# Patient Record
Sex: Male | Born: 1935 | Race: White | Hispanic: No | Marital: Married | Smoking: Former smoker
Health system: Southern US, Community
[De-identification: ages and names within clinical notes are randomized; demographics above are authoritative.]

## PROBLEM LIST (undated history)

## (undated) DIAGNOSIS — I2510 Atherosclerotic heart disease of native coronary artery without angina pectoris: Secondary | ICD-10-CM

## (undated) DIAGNOSIS — N189 Chronic kidney disease, unspecified: Secondary | ICD-10-CM

## (undated) DIAGNOSIS — I10 Essential (primary) hypertension: Secondary | ICD-10-CM

## (undated) DIAGNOSIS — K222 Esophageal obstruction: Secondary | ICD-10-CM

## (undated) DIAGNOSIS — Z8551 Personal history of malignant neoplasm of bladder: Secondary | ICD-10-CM

## (undated) DIAGNOSIS — F0390 Unspecified dementia without behavioral disturbance: Secondary | ICD-10-CM

## (undated) DIAGNOSIS — C4490 Unspecified malignant neoplasm of skin, unspecified: Secondary | ICD-10-CM

## (undated) DIAGNOSIS — J189 Pneumonia, unspecified organism: Secondary | ICD-10-CM

## (undated) DIAGNOSIS — Z9981 Dependence on supplemental oxygen: Secondary | ICD-10-CM

## (undated) DIAGNOSIS — R413 Other amnesia: Secondary | ICD-10-CM

## (undated) DIAGNOSIS — G629 Polyneuropathy, unspecified: Secondary | ICD-10-CM

## (undated) DIAGNOSIS — R0600 Dyspnea, unspecified: Secondary | ICD-10-CM

## (undated) DIAGNOSIS — I219 Acute myocardial infarction, unspecified: Secondary | ICD-10-CM

## (undated) DIAGNOSIS — E785 Hyperlipidemia, unspecified: Secondary | ICD-10-CM

## (undated) DIAGNOSIS — E538 Deficiency of other specified B group vitamins: Secondary | ICD-10-CM

## (undated) DIAGNOSIS — M1990 Unspecified osteoarthritis, unspecified site: Secondary | ICD-10-CM

## (undated) DIAGNOSIS — N400 Enlarged prostate without lower urinary tract symptoms: Secondary | ICD-10-CM

## (undated) DIAGNOSIS — I350 Nonrheumatic aortic (valve) stenosis: Secondary | ICD-10-CM

## (undated) DIAGNOSIS — J449 Chronic obstructive pulmonary disease, unspecified: Secondary | ICD-10-CM

## (undated) DIAGNOSIS — C61 Malignant neoplasm of prostate: Secondary | ICD-10-CM

## (undated) HISTORY — DX: Deficiency of other specified B group vitamins: E53.8

## (undated) HISTORY — PX: CORONARY ANGIOPLASTY WITH STENT PLACEMENT: SHX49

## (undated) HISTORY — PX: CATARACT EXTRACTION W/ INTRAOCULAR LENS  IMPLANT, BILATERAL: SHX1307

## (undated) HISTORY — DX: Chronic obstructive pulmonary disease, unspecified: J44.9

## (undated) HISTORY — PX: APPENDECTOMY: SHX54

## (undated) HISTORY — DX: Unspecified osteoarthritis, unspecified site: M19.90

## (undated) HISTORY — DX: Personal history of malignant neoplasm of bladder: Z85.51

## (undated) HISTORY — DX: Hyperlipidemia, unspecified: E78.5

## (undated) HISTORY — DX: Esophageal obstruction: K22.2

## (undated) HISTORY — PX: ESOPHAGOGASTRODUODENOSCOPY: SHX1529

## (undated) HISTORY — DX: Nonrheumatic aortic (valve) stenosis: I35.0

## (undated) HISTORY — PX: ELBOW SURGERY: SHX618

## (undated) HISTORY — PX: TONSILLECTOMY: SUR1361

## (undated) HISTORY — PX: TOTAL KNEE ARTHROPLASTY: SHX125

## (undated) HISTORY — DX: Essential (primary) hypertension: I10

## (undated) HISTORY — DX: Acute myocardial infarction, unspecified: I21.9

## (undated) HISTORY — PX: CHOLECYSTECTOMY: SHX55

## (undated) HISTORY — DX: Atherosclerotic heart disease of native coronary artery without angina pectoris: I25.10

## (undated) HISTORY — PX: COLONOSCOPY: SHX174

## (undated) HISTORY — DX: Polyneuropathy, unspecified: G62.9

---

## 1993-03-14 HISTORY — PX: CORONARY ANGIOPLASTY: SHX604

## 1997-03-14 HISTORY — PX: SIGMOIDOSCOPY: SUR1295

## 1997-07-08 ENCOUNTER — Other Ambulatory Visit: Admission: RE | Admit: 1997-07-08 | Discharge: 1997-07-08 | Payer: Self-pay | Admitting: *Deleted

## 1998-10-21 ENCOUNTER — Ambulatory Visit (HOSPITAL_COMMUNITY): Admission: RE | Admit: 1998-10-21 | Discharge: 1998-10-21 | Payer: Self-pay | Admitting: Cardiology

## 1998-11-19 ENCOUNTER — Ambulatory Visit (HOSPITAL_COMMUNITY): Admission: RE | Admit: 1998-11-19 | Discharge: 1998-11-19 | Payer: Self-pay | Admitting: Gastroenterology

## 1999-12-24 ENCOUNTER — Encounter: Payer: Self-pay | Admitting: Orthopaedic Surgery

## 1999-12-30 ENCOUNTER — Inpatient Hospital Stay (HOSPITAL_COMMUNITY): Admission: RE | Admit: 1999-12-30 | Discharge: 2000-01-03 | Payer: Self-pay | Admitting: Orthopaedic Surgery

## 2000-01-03 ENCOUNTER — Encounter: Payer: Self-pay | Admitting: Physical Medicine & Rehabilitation

## 2000-01-03 ENCOUNTER — Inpatient Hospital Stay (HOSPITAL_COMMUNITY)
Admission: RE | Admit: 2000-01-03 | Discharge: 2000-01-07 | Payer: Self-pay | Admitting: Physical Medicine & Rehabilitation

## 2002-08-08 ENCOUNTER — Encounter: Payer: Self-pay | Admitting: Internal Medicine

## 2002-10-31 ENCOUNTER — Encounter: Payer: Self-pay | Admitting: Neurology

## 2002-10-31 ENCOUNTER — Ambulatory Visit (HOSPITAL_COMMUNITY): Admission: RE | Admit: 2002-10-31 | Discharge: 2002-10-31 | Payer: Self-pay | Admitting: Neurology

## 2002-12-26 ENCOUNTER — Ambulatory Visit (HOSPITAL_COMMUNITY): Admission: RE | Admit: 2002-12-26 | Discharge: 2002-12-26 | Payer: Self-pay | Admitting: Neurology

## 2002-12-26 ENCOUNTER — Encounter: Payer: Self-pay | Admitting: Neurology

## 2004-02-01 ENCOUNTER — Ambulatory Visit: Payer: Self-pay | Admitting: Cardiovascular Disease

## 2004-02-02 ENCOUNTER — Encounter: Payer: Self-pay | Admitting: Cardiovascular Disease

## 2004-02-02 ENCOUNTER — Inpatient Hospital Stay (HOSPITAL_COMMUNITY): Admission: EM | Admit: 2004-02-02 | Discharge: 2004-02-04 | Payer: Self-pay | Admitting: Emergency Medicine

## 2004-02-16 ENCOUNTER — Ambulatory Visit: Payer: Self-pay | Admitting: Cardiology

## 2004-02-25 ENCOUNTER — Ambulatory Visit: Payer: Self-pay | Admitting: Family Medicine

## 2004-02-27 ENCOUNTER — Encounter: Admission: RE | Admit: 2004-02-27 | Discharge: 2004-02-27 | Payer: Self-pay | Admitting: Family Medicine

## 2004-03-11 ENCOUNTER — Ambulatory Visit: Payer: Self-pay | Admitting: Family Medicine

## 2004-05-31 ENCOUNTER — Encounter (INDEPENDENT_AMBULATORY_CARE_PROVIDER_SITE_OTHER): Payer: Self-pay | Admitting: *Deleted

## 2004-05-31 ENCOUNTER — Ambulatory Visit (HOSPITAL_COMMUNITY): Admission: RE | Admit: 2004-05-31 | Discharge: 2004-05-31 | Payer: Self-pay | Admitting: Gastroenterology

## 2004-12-27 ENCOUNTER — Ambulatory Visit: Payer: Self-pay | Admitting: Family Medicine

## 2005-01-31 ENCOUNTER — Ambulatory Visit: Payer: Self-pay | Admitting: Family Medicine

## 2005-02-16 ENCOUNTER — Ambulatory Visit: Payer: Self-pay | Admitting: Cardiology

## 2005-03-10 ENCOUNTER — Ambulatory Visit: Payer: Self-pay | Admitting: Family Medicine

## 2005-08-29 ENCOUNTER — Ambulatory Visit: Payer: Self-pay | Admitting: Cardiology

## 2005-12-22 ENCOUNTER — Ambulatory Visit: Payer: Self-pay | Admitting: Family Medicine

## 2006-03-31 ENCOUNTER — Ambulatory Visit: Payer: Self-pay | Admitting: Family Medicine

## 2006-08-16 ENCOUNTER — Ambulatory Visit: Payer: Self-pay | Admitting: Cardiology

## 2006-09-05 ENCOUNTER — Ambulatory Visit: Payer: Self-pay

## 2006-09-05 ENCOUNTER — Encounter: Payer: Self-pay | Admitting: Cardiology

## 2006-09-14 ENCOUNTER — Ambulatory Visit: Payer: Self-pay | Admitting: Family Medicine

## 2006-12-22 ENCOUNTER — Ambulatory Visit: Payer: Self-pay | Admitting: Family Medicine

## 2007-01-16 ENCOUNTER — Ambulatory Visit: Payer: Self-pay | Admitting: Vascular Surgery

## 2007-06-07 ENCOUNTER — Telehealth: Payer: Self-pay | Admitting: Family Medicine

## 2007-06-11 ENCOUNTER — Ambulatory Visit: Payer: Self-pay | Admitting: Family Medicine

## 2007-06-11 DIAGNOSIS — I10 Essential (primary) hypertension: Secondary | ICD-10-CM | POA: Insufficient documentation

## 2007-06-11 DIAGNOSIS — C679 Malignant neoplasm of bladder, unspecified: Secondary | ICD-10-CM | POA: Insufficient documentation

## 2007-06-11 DIAGNOSIS — I252 Old myocardial infarction: Secondary | ICD-10-CM

## 2007-06-11 DIAGNOSIS — I2510 Atherosclerotic heart disease of native coronary artery without angina pectoris: Secondary | ICD-10-CM | POA: Insufficient documentation

## 2007-06-11 DIAGNOSIS — M1990 Unspecified osteoarthritis, unspecified site: Secondary | ICD-10-CM | POA: Insufficient documentation

## 2007-06-11 DIAGNOSIS — J309 Allergic rhinitis, unspecified: Secondary | ICD-10-CM | POA: Insufficient documentation

## 2007-06-11 DIAGNOSIS — E785 Hyperlipidemia, unspecified: Secondary | ICD-10-CM | POA: Insufficient documentation

## 2007-07-31 ENCOUNTER — Ambulatory Visit: Payer: Self-pay | Admitting: Vascular Surgery

## 2007-08-23 ENCOUNTER — Ambulatory Visit: Payer: Self-pay | Admitting: Cardiology

## 2007-09-07 ENCOUNTER — Telehealth: Payer: Self-pay | Admitting: Family Medicine

## 2007-10-11 ENCOUNTER — Telehealth: Payer: Self-pay | Admitting: Family Medicine

## 2007-10-30 ENCOUNTER — Telehealth: Payer: Self-pay | Admitting: Family Medicine

## 2007-12-25 ENCOUNTER — Ambulatory Visit: Payer: Self-pay | Admitting: Family Medicine

## 2008-01-22 ENCOUNTER — Ambulatory Visit: Payer: Self-pay | Admitting: Family Medicine

## 2008-01-22 DIAGNOSIS — G589 Mononeuropathy, unspecified: Secondary | ICD-10-CM | POA: Insufficient documentation

## 2008-01-23 ENCOUNTER — Encounter: Payer: Self-pay | Admitting: Family Medicine

## 2008-01-25 ENCOUNTER — Ambulatory Visit: Payer: Self-pay | Admitting: Family Medicine

## 2008-01-25 DIAGNOSIS — E538 Deficiency of other specified B group vitamins: Secondary | ICD-10-CM | POA: Insufficient documentation

## 2008-01-25 LAB — CONVERTED CEMR LAB
ALT: 15 units/L (ref 0–53)
AST: 21 units/L (ref 0–37)
Alkaline Phosphatase: 52 units/L (ref 39–117)
Basophils Absolute: 0.1 10*3/uL (ref 0.0–0.1)
CO2: 28 meq/L (ref 19–32)
Creatinine, Ser: 1.3 mg/dL (ref 0.4–1.5)
Eosinophils Absolute: 0.1 10*3/uL (ref 0.0–0.7)
Eosinophils Relative: 2.3 % (ref 0.0–5.0)
Glucose, Bld: 94 mg/dL (ref 70–99)
HCT: 43.2 % (ref 39.0–52.0)
Hemoglobin: 15.2 g/dL (ref 13.0–17.0)
MCHC: 35.2 g/dL (ref 30.0–36.0)
MCV: 94.7 fL (ref 78.0–100.0)
Monocytes Absolute: 0.3 10*3/uL (ref 0.1–1.0)
Neutrophils Relative %: 62.7 % (ref 43.0–77.0)
Platelets: 187 10*3/uL (ref 150–400)
Potassium: 3.7 meq/L (ref 3.5–5.1)
RBC: 4.56 M/uL (ref 4.22–5.81)
RDW: 12 % (ref 11.5–14.6)
Sodium: 142 meq/L (ref 135–145)
TSH: 2.89 microintl units/mL (ref 0.35–5.50)
Total Bilirubin: 0.8 mg/dL (ref 0.3–1.2)
WBC: 5.6 10*3/uL (ref 4.5–10.5)

## 2008-01-30 LAB — CONVERTED CEMR LAB: Zinc: 822 (ref 600–1200)

## 2008-01-31 ENCOUNTER — Ambulatory Visit: Payer: Self-pay | Admitting: Vascular Surgery

## 2008-02-01 ENCOUNTER — Ambulatory Visit: Payer: Self-pay | Admitting: Family Medicine

## 2008-02-08 ENCOUNTER — Ambulatory Visit: Payer: Self-pay | Admitting: Family Medicine

## 2008-02-15 ENCOUNTER — Ambulatory Visit: Payer: Self-pay | Admitting: Family Medicine

## 2008-02-22 ENCOUNTER — Ambulatory Visit: Payer: Self-pay | Admitting: Family Medicine

## 2008-02-29 ENCOUNTER — Ambulatory Visit: Payer: Self-pay | Admitting: Family Medicine

## 2008-03-06 ENCOUNTER — Ambulatory Visit: Payer: Self-pay | Admitting: Family Medicine

## 2008-03-18 ENCOUNTER — Ambulatory Visit: Payer: Self-pay | Admitting: Family Medicine

## 2008-03-24 ENCOUNTER — Ambulatory Visit: Payer: Self-pay | Admitting: Family Medicine

## 2008-03-28 ENCOUNTER — Ambulatory Visit: Payer: Self-pay | Admitting: Family Medicine

## 2008-04-07 ENCOUNTER — Ambulatory Visit: Payer: Self-pay | Admitting: Family Medicine

## 2008-04-21 ENCOUNTER — Ambulatory Visit: Payer: Self-pay | Admitting: Family Medicine

## 2008-05-05 ENCOUNTER — Ambulatory Visit: Payer: Self-pay | Admitting: Internal Medicine

## 2008-05-12 LAB — CONVERTED CEMR LAB
Cholesterol: 301 mg/dL (ref 0–200)
Direct LDL: 188.6 mg/dL
HDL: 47.1 mg/dL (ref >39.0)
Total CHOL/HDL Ratio: 6.4
VLDL: 71 mg/dL — ABNORMAL HIGH (ref 0–40)

## 2008-05-20 ENCOUNTER — Ambulatory Visit: Payer: Self-pay | Admitting: Family Medicine

## 2008-06-17 ENCOUNTER — Ambulatory Visit: Payer: Self-pay | Admitting: Family Medicine

## 2008-06-30 ENCOUNTER — Telehealth: Payer: Self-pay | Admitting: Family Medicine

## 2008-07-18 ENCOUNTER — Ambulatory Visit: Payer: Self-pay | Admitting: Family Medicine

## 2008-07-28 ENCOUNTER — Telehealth: Payer: Self-pay | Admitting: Family Medicine

## 2008-08-08 ENCOUNTER — Ambulatory Visit: Payer: Self-pay | Admitting: Vascular Surgery

## 2008-08-12 ENCOUNTER — Ambulatory Visit: Payer: Self-pay | Admitting: Vascular Surgery

## 2008-08-12 HISTORY — PX: CAROTID ENDARTERECTOMY: SUR193

## 2008-08-15 ENCOUNTER — Ambulatory Visit: Payer: Self-pay | Admitting: Vascular Surgery

## 2008-08-15 ENCOUNTER — Encounter: Payer: Self-pay | Admitting: Vascular Surgery

## 2008-08-15 ENCOUNTER — Inpatient Hospital Stay (HOSPITAL_COMMUNITY): Admission: RE | Admit: 2008-08-15 | Discharge: 2008-08-16 | Payer: Self-pay | Admitting: Vascular Surgery

## 2008-08-26 ENCOUNTER — Ambulatory Visit: Payer: Self-pay | Admitting: Vascular Surgery

## 2008-09-04 DIAGNOSIS — I350 Nonrheumatic aortic (valve) stenosis: Secondary | ICD-10-CM

## 2008-09-08 ENCOUNTER — Telehealth: Payer: Self-pay | Admitting: Family Medicine

## 2008-09-12 ENCOUNTER — Ambulatory Visit: Payer: Self-pay | Admitting: Cardiology

## 2008-09-22 ENCOUNTER — Ambulatory Visit: Payer: Self-pay | Admitting: Family Medicine

## 2008-10-03 ENCOUNTER — Telehealth: Payer: Self-pay | Admitting: Family Medicine

## 2008-10-20 ENCOUNTER — Ambulatory Visit: Payer: Self-pay | Admitting: Family Medicine

## 2008-10-20 ENCOUNTER — Telehealth: Payer: Self-pay | Admitting: Family Medicine

## 2008-10-22 ENCOUNTER — Ambulatory Visit: Payer: Self-pay | Admitting: Family Medicine

## 2008-10-22 DIAGNOSIS — I6529 Occlusion and stenosis of unspecified carotid artery: Secondary | ICD-10-CM

## 2008-10-31 ENCOUNTER — Telehealth: Payer: Self-pay | Admitting: Family Medicine

## 2008-11-05 ENCOUNTER — Ambulatory Visit: Payer: Self-pay | Admitting: Family Medicine

## 2008-11-05 DIAGNOSIS — N401 Enlarged prostate with lower urinary tract symptoms: Secondary | ICD-10-CM

## 2008-11-10 LAB — CONVERTED CEMR LAB
AST: 21 units/L (ref 0–37)
BUN: 20 mg/dL (ref 6–23)
Basophils Absolute: 0 10*3/uL (ref 0.0–0.1)
Basophils Relative: 0.4 % (ref 0.0–3.0)
Calcium: 8.8 mg/dL (ref 8.4–10.5)
Chloride: 109 meq/L (ref 96–112)
Cholesterol: 267 mg/dL — ABNORMAL HIGH (ref 0–200)
Eosinophils Relative: 3 % (ref 0.0–5.0)
HCT: 42.3 % (ref 39.0–52.0)
Hemoglobin: 14.6 g/dL (ref 13.0–17.0)
Lymphs Abs: 1.6 10*3/uL (ref 0.7–4.0)
Neutrophils Relative %: 59.7 % (ref 43.0–77.0)
Potassium: 4.3 meq/L (ref 3.5–5.1)
RBC: 4.35 M/uL (ref 4.22–5.81)
RDW: 11.9 % (ref 11.5–14.6)
TSH: 4.22 microintl units/mL (ref 0.35–5.50)
Total CHOL/HDL Ratio: 5
VLDL: 44.8 mg/dL — ABNORMAL HIGH (ref 0.0–40.0)

## 2008-11-21 ENCOUNTER — Ambulatory Visit: Payer: Self-pay | Admitting: Family Medicine

## 2008-12-02 ENCOUNTER — Encounter: Payer: Self-pay | Admitting: Family Medicine

## 2008-12-22 ENCOUNTER — Ambulatory Visit: Payer: Self-pay | Admitting: Family Medicine

## 2008-12-22 DIAGNOSIS — H6120 Impacted cerumen, unspecified ear: Secondary | ICD-10-CM

## 2008-12-22 DIAGNOSIS — R42 Dizziness and giddiness: Secondary | ICD-10-CM | POA: Insufficient documentation

## 2009-01-21 ENCOUNTER — Ambulatory Visit: Payer: Self-pay | Admitting: Family Medicine

## 2009-02-18 ENCOUNTER — Ambulatory Visit: Payer: Self-pay | Admitting: Family Medicine

## 2009-03-02 ENCOUNTER — Ambulatory Visit: Payer: Self-pay | Admitting: Vascular Surgery

## 2009-03-03 ENCOUNTER — Ambulatory Visit: Payer: Self-pay | Admitting: Family Medicine

## 2009-03-03 DIAGNOSIS — R1310 Dysphagia, unspecified: Secondary | ICD-10-CM | POA: Insufficient documentation

## 2009-03-10 ENCOUNTER — Encounter (INDEPENDENT_AMBULATORY_CARE_PROVIDER_SITE_OTHER): Payer: Self-pay | Admitting: *Deleted

## 2009-04-06 ENCOUNTER — Telehealth: Payer: Self-pay | Admitting: Internal Medicine

## 2009-04-09 ENCOUNTER — Encounter: Payer: Self-pay | Admitting: Family Medicine

## 2009-04-09 ENCOUNTER — Encounter (INDEPENDENT_AMBULATORY_CARE_PROVIDER_SITE_OTHER): Payer: Self-pay | Admitting: *Deleted

## 2009-04-09 ENCOUNTER — Ambulatory Visit: Payer: Self-pay | Admitting: Internal Medicine

## 2009-04-09 DIAGNOSIS — K5900 Constipation, unspecified: Secondary | ICD-10-CM | POA: Insufficient documentation

## 2009-04-16 ENCOUNTER — Ambulatory Visit (HOSPITAL_COMMUNITY): Admission: RE | Admit: 2009-04-16 | Discharge: 2009-04-16 | Payer: Self-pay | Admitting: Internal Medicine

## 2009-04-16 ENCOUNTER — Ambulatory Visit: Payer: Self-pay | Admitting: Internal Medicine

## 2009-04-16 DIAGNOSIS — K222 Esophageal obstruction: Secondary | ICD-10-CM

## 2009-04-16 DIAGNOSIS — K219 Gastro-esophageal reflux disease without esophagitis: Secondary | ICD-10-CM

## 2009-04-28 ENCOUNTER — Ambulatory Visit: Payer: Self-pay | Admitting: Family Medicine

## 2009-06-01 ENCOUNTER — Ambulatory Visit: Payer: Self-pay | Admitting: Family Medicine

## 2009-06-25 ENCOUNTER — Telehealth: Payer: Self-pay | Admitting: Family Medicine

## 2009-06-30 ENCOUNTER — Ambulatory Visit: Payer: Self-pay | Admitting: Internal Medicine

## 2009-07-22 ENCOUNTER — Ambulatory Visit: Payer: Self-pay | Admitting: Family Medicine

## 2009-08-26 ENCOUNTER — Telehealth: Payer: Self-pay | Admitting: Family Medicine

## 2009-08-26 ENCOUNTER — Ambulatory Visit: Payer: Self-pay | Admitting: Family Medicine

## 2009-09-28 ENCOUNTER — Ambulatory Visit: Payer: Self-pay | Admitting: Family Medicine

## 2009-09-30 ENCOUNTER — Telehealth: Payer: Self-pay | Admitting: Family Medicine

## 2009-10-12 ENCOUNTER — Telehealth: Payer: Self-pay | Admitting: Family Medicine

## 2009-10-30 ENCOUNTER — Ambulatory Visit: Payer: Self-pay | Admitting: Family Medicine

## 2009-11-27 ENCOUNTER — Ambulatory Visit: Payer: Self-pay | Admitting: Family Medicine

## 2009-11-27 ENCOUNTER — Ambulatory Visit: Payer: Self-pay | Admitting: Cardiology

## 2009-12-07 ENCOUNTER — Telehealth (INDEPENDENT_AMBULATORY_CARE_PROVIDER_SITE_OTHER): Payer: Self-pay | Admitting: *Deleted

## 2009-12-28 ENCOUNTER — Ambulatory Visit: Payer: Self-pay | Admitting: Family Medicine

## 2010-02-03 ENCOUNTER — Ambulatory Visit: Payer: Self-pay | Admitting: Family Medicine

## 2010-03-09 ENCOUNTER — Ambulatory Visit: Payer: Self-pay | Admitting: Family Medicine

## 2010-03-30 ENCOUNTER — Ambulatory Visit
Admission: RE | Admit: 2010-03-30 | Discharge: 2010-03-30 | Payer: Self-pay | Source: Home / Self Care | Attending: Vascular Surgery | Admitting: Vascular Surgery

## 2010-04-06 NOTE — Procedures (Unsigned)
CAROTID DUPLEX EXAM  INDICATION:  Followup CEA.  HISTORY: Diabetes:  No Cardiac:  Yes Hypertension:  Yes Smoking:  No Previous Surgery:  Left CEA with ICA resection performed on 08/15/2008 by Dr. Hart Rochester CV History: Amaurosis Fugax  No, Paresthesias  No, Hemiparesis  No                                      RIGHT             LEFT Brachial systolic pressure:         164               160 Brachial Doppler waveforms:         WNL               WNL Vertebral direction of flow:        Antegrade         Antegrade DUPLEX VELOCITIES (cm/sec) CCA peak systolic                   59                58 ECA peak systolic                   83                50 ICA peak systolic                   48                76 ICA end diastolic                   14                24 PLAQUE MORPHOLOGY:                  Heterogenous / calcific             NA PLAQUE AMOUNT:                      mild              NA PLAQUE LOCATION:                    CCA/ ECA/ ICA     NA  IMPRESSION: 1. Widely patent left carotid endarterectomy and anastomosis of     internal carotid artery resection. 2. 1% to 39% right internal carotid artery plaquing. 3. Antegrade vertebral arteries bilaterally. 4. Stable findings from previous exam.  ___________________________________________ Quita Skye. Hart Rochester, M.D.  LT/MEDQ  D:  03/30/2010  T:  03/30/2010  Job:  [id number]

## 2010-04-13 ENCOUNTER — Ambulatory Visit
Admission: RE | Admit: 2010-04-13 | Discharge: 2010-04-13 | Payer: Self-pay | Source: Home / Self Care | Attending: Family Medicine | Admitting: Family Medicine

## 2010-04-13 DIAGNOSIS — J209 Acute bronchitis, unspecified: Secondary | ICD-10-CM | POA: Insufficient documentation

## 2010-04-13 DIAGNOSIS — R05 Cough: Secondary | ICD-10-CM | POA: Insufficient documentation

## 2010-04-13 NOTE — Assessment & Plan Note (Signed)
Summary: B-12INJ/RCD  Nurse Visit   Vitals Entered By: Raechel Ache, RN (August 26, 2009 1:53 PM)  Allergies: 1)  ! Sulfamethoxazole (Sulfamethoxazole)  Medication Administration  Injection # 1:    Medication: Vit B12 1000 mcg    Diagnosis: VITAMIN B12 DEFICIENCY (ICD-266.2)    Route: IM    Site: L deltoid    Exp Date: 09/12    Lot #: [id number]    Mfr: [employer]    Patient tolerated injection without complications    Given by: Raechel Ache, RN (August 26, 2009 1:54 PM)  Orders Added: 1)  Vit B12 1000 mcg [J3420] 2)  Admin of Therapeutic Inj  intramuscular or subcutaneous [[id number]]

## 2010-04-13 NOTE — Assessment & Plan Note (Signed)
Summary: B12 INJ/NJR  Nurse Visit   Allergies: 1)  ! Sulfamethoxazole (Sulfamethoxazole)  Medication Administration  Injection # 1:    Medication: Vit B12 1000 mcg    Diagnosis: VITAMIN B12 DEFICIENCY (ICD-266.2)    Route: IM    Site: L deltoid    Exp Date: 09/12    Lot #: [id number]    Mfr: [employer]    Patient tolerated injection without complications    Given by: Raechel Ache, RN (Jul 22, 2009 2:51 PM)  Orders Added: 1)  Vit B12 1000 mcg [J3420] 2)  Admin of Therapeutic Inj  intramuscular or subcutaneous [[id number]]

## 2010-04-13 NOTE — Progress Notes (Signed)
  Phone Note Refill Request   Refills Requested: Medication #1:  ALLEGRA 180 MG  TABS once daily   Dosage confirmed as above?Dosage Confirmed  Medication #2:  TIAZAC 240 MG  CP24 1 by mouth once daily   Dosage confirmed as above?Dosage Confirmed Initial call taken by: Josph Macho RMA,  October 12, 2009 1:11 PM    Prescriptions: TIAZAC 240 MG  CP24 (DILTIAZEM HCL ER BEADS) 1 by mouth once daily  #90 x 3   Entered by:   Josph Macho RMA   Authorized by:   Nelwyn Salisbury MD   Signed by:   Josph Macho RMA on 10/12/2009   Method used:   Faxed to ...       [facility])       [address] [address]       [place], [place]  [address]       Ph: [phone]       Fax: [phone]   RxID:   [id number] ALLEGRA 180 MG  TABS (FEXOFENADINE HCL) once daily  #90 x 3   Entered by:   Josph Macho RMA   Authorized by:   Nelwyn Salisbury MD   Signed by:   Josph Macho RMA on 10/12/2009   Method used:   Faxed to ...       [facility])       [address] [address]       [place], [place]  [address]       Ph: [phone]       Fax: [phone]   RxID:   [id number]

## 2010-04-13 NOTE — Progress Notes (Signed)
Summary: Flu vaccine  Phone Note From Other Clinic        Immunization History:  Influenza Immunization History:    Influenza:  historical (10/06/2009)

## 2010-04-13 NOTE — Assessment & Plan Note (Signed)
Summary: B12 INJ // RS  Nurse Visit   Allergies: 1)  ! Sulfamethoxazole (Sulfamethoxazole)  Medication Administration  Injection # 1:    Medication: Vit B12 1000 mcg    Diagnosis: VITAMIN B12 DEFICIENCY (ICD-266.2)    Route: IM    Site: L deltoid    Exp Date: 7/13    Lot #: [id number]    Mfr: [employer]    Patient tolerated injection without complications    Given by: Alfred Levins, CMA (February 03, 2010 1:44 PM)  Orders Added: 1)  Vit B12 1000 mcg [J3420] 2)  Admin of Therapeutic Inj  intramuscular or subcutaneous [[id number]]

## 2010-04-13 NOTE — Progress Notes (Signed)
Summary: question  Phone Note Other Incoming   Caller: patient Summary of Call: Is it ok to take Neuropathy formula?  OTC Vit B's and Lipoic acid. Initial call taken by: Raechel Ache, RN,  August 26, 2009 1:53 PM  Follow-up for Phone Call        yes this is fine Follow-up by: Nelwyn Salisbury MD,  August 27, 2009 8:26 AM  Additional Follow-up for Phone Call Additional follow up Details #1::        Phone call completed Additional Follow-up by: Raechel Ache, RN,  August 27, 2009 10:31 AM

## 2010-04-13 NOTE — Letter (Signed)
Summary: Application for Handicapped Placard  Application for Handicapped Placard   Imported By: Maryln Gottron 04/09/2009 13:54:22  _____________________________________________________________________  External Attachment:    Type:   Image     Comment:   External Document

## 2010-04-13 NOTE — Assessment & Plan Note (Signed)
Summary: B-12//ALP  Nurse Visit   Allergies: 1)  ! Sulfamethoxazole (Sulfamethoxazole)  Medication Administration  Injection # 1:    Medication: Vit B12 1000 mcg    Diagnosis: VITAMIN B12 DEFICIENCY (ICD-266.2)    Route: IM    Site: L deltoid    Exp Date: 02/13    Lot #: [id number]    Mfr: [employer]    Patient tolerated injection without complications    Given by: Raechel Ache, RN (September 28, 2009 1:46 PM)  Orders Added: 1)  Vit B12 1000 mcg [J3420] 2)  Admin of Therapeutic Inj  intramuscular or subcutaneous [[id number]]

## 2010-04-13 NOTE — Procedures (Signed)
Summary: Instructions for procedure/[facility] (out pt)  Instructions for procedure/[facility] (out pt)   Imported By: Sherian Rein 04/16/2009 08:52:14  _____________________________________________________________________  External Attachment:    Type:   Image     Comment:   External Document

## 2010-04-13 NOTE — Progress Notes (Signed)
Summary: REQ FOR REFILL RX (MAVIK / FLOMAX)  Phone Note Call from Patient   Caller: Patient  [phone] Reason for Call: Refill Medication Summary of Call: Pt called to adv that he needs to have a refill on meds:  Mavik 4 Mg  and   Flomax 0.4 Mg  ...... Pt adv that both Rx can be sent to [facility] at [phone].  Initial call taken by: Debbra Riding,  June 25, 2009 11:04 AM  Follow-up for Phone Call        Rx faxed Follow-up by: Raechel Ache, RN,  June 25, 2009 11:16 AM    Prescriptions: FLOMAX 0.4 MG  CP24 (TAMSULOSIN HCL) 1 by mouth once daily  #90 x 3   Entered by:   Raechel Ache, RN   Authorized by:   Nelwyn Salisbury MD   Signed by:   Raechel Ache, RN on 06/25/2009   Method used:   Printed then faxed to ...       [facility]. [phone]* (retail)       [phone]       [place], [place]  [address]       Ph: [phone]       Fax: [phone]   RxID:   [id number] MAVIK 4 MG  TABS (TRANDOLAPRIL) Take 1 tablet by mouth once a day  #90 x 3   Entered by:   Raechel Ache, RN   Authorized by:   Nelwyn Salisbury MD   Signed by:   Raechel Ache, RN on 06/25/2009   Method used:   Printed then faxed to ...       [facility]. [phone]* (retail)       [phone] [address]       [place], [place]  [address]       Ph: [phone]       Fax: [phone]   RxID:   [id number]

## 2010-04-13 NOTE — Assessment & Plan Note (Signed)
Summary: B12 INJ // RS  Nurse Visit   Allergies: 1)  ! Sulfamethoxazole (Sulfamethoxazole)  Medication Administration  Injection # 1:    Medication: Vit B12 1000 mcg    Diagnosis: VITAMIN B12 DEFICIENCY (ICD-266.2)    Route: IM    Site: L deltoid    Exp Date: 04/13    Lot #: [id number]    Mfr: [employer]    Patient tolerated injection without complications    Given by: Raechel Ache, RN (October 30, 2009 2:11 PM)  Orders Added: 1)  Vit B12 1000 mcg [J3420] 2)  Admin of Therapeutic Inj  intramuscular or subcutaneous [[id number]]

## 2010-04-13 NOTE — Procedures (Signed)
Summary: Upper Endoscopy w/DIL  Patient: Thomas [name] Note: All result statuses are Final unless otherwise noted.  Tests: (1) Upper Endoscopy w/DIL (UED)  UED Upper Endoscopy w/DIL                             DONE     [facility]     [address] [place], [place]  [address]           ENDOSCOPY PROCEDURE REPORT           PATIENT:  [name], [name]  MR#:  [id number]     BIRTHDATE:  Jul 24, 1916, 89 yrs. old  GENDER:  male           ENDOSCOPIST:  Iva Boop, MD, [facility]           PROCEDURE DATE:  04/16/2009     PROCEDURE:  EGD with balloon dilatation     ASA CLASS:  Class III     INDICATIONS:  1) dysphagia           MEDICATIONS:   Fentanyl 50 mcg IV, Versed 6 mg IV     TOPICAL ANESTHETIC:  Cetacaine Spray           DESCRIPTION OF PROCEDURE:   After the risks benefits and     alternatives of the procedure were thoroughly explained, informed     consent was obtained.  The  endoscope was introduced through the     mouth and advanced to the second portion of the duodenum, without     limitations.  The instrument was slowly withdrawn as the mucosa     was carefully examined.     <<PROCEDUREIMAGES>>           A stricture was found in the distal esophagus. Ring-like     stricture, no inflammation.  A hiatal hernia was found. It was 2     cm in size.  Mild gastritis was found. Mottled and friable mucosa.     The examination was otherwise normal.    Dilation was then     performed at the distal esophagus           1) Dilator:  Balloon  Size(s):  18 mm     Resistance:  minimal  Heme:  yes     Appearance:  satisfactory           COMPLICATIONS:  None           ENDOSCOPIC IMPRESSION:     1) Stricture in the distal esophagus - dilated to 18 mm     2) 2 cm hiatal hernia     3) Mild gastritis     4) Otherwise normal examination.     RECOMMENDATIONS:     Clear liquids until 530 PM and then soft diet.     Regular diet tomorrow.     Resume Plavix tomorrow.     resume  other meds other meds today     Start omeprazole 20 mg daily (Rx sent to pharmacy)           REPEAT EXAM:  as needed           Iva Boop, MD, [facility]           CC:  Tera Mater. Clent Ridges, M.D.     The Patient           n.  eSIGNED:   Iva Boop at 04/16/2009 03:33 PM           Ralene Muskrat, [phone]  Note: An exclamation mark (!) indicates a result that was not dispersed into the flowsheet. Document Creation Date: 04/16/2009 3:33 PM _______________________________________________________________________  (1) Order result status: Final Collection or observation date-time: 04/16/2009 15:08 Requested date-time:  Receipt date-time:  Reported date-time:  Referring Physician:   Ordering Physician: Stan Head [phone]) Specimen Source:  Source: Launa Grill Order Number: [id number] Lab site:   Appended Document: Upper Endoscopy w/DIL need to arrange late April (approx) follow-up with me  Appended Document: Upper Endoscopy w/DIL April schedule is not out yet, I have sent myself a reminder flag for the beginning of March to arrange for late April appointment   Appended Document: Upper Endoscopy w/DIL Patient  is scheduled for 06-30-09 2:00

## 2010-04-13 NOTE — Assessment & Plan Note (Signed)
Summary: b-12 inj/cjr  Nurse Visit   Allergies: 1)  ! Sulfamethoxazole (Sulfamethoxazole)  Medication Administration  Injection # 1:    Medication: Vit B12 1000 mcg    Diagnosis: VITAMIN B12 DEFICIENCY (ICD-266.2)    Route: IM    Site: L deltoid    Exp Date: 9/12    Lot #: [id number]    Mfr: [employer]    Patient tolerated injection without complications    Given by: Alfred Levins, CMA (April 28, 2009 2:03 PM)  Orders Added: 1)  Vit B12 1000 mcg [J3420] 2)  Admin of Therapeutic Inj  intramuscular or subcutaneous [[id number]]

## 2010-04-13 NOTE — Assessment & Plan Note (Signed)
Summary: follow up EGD/sheri   History of Present Illness Visit Type: Follow-up Visit Primary GI MD: Stan Head MD [facility] Primary Provider: Nelwyn Salisbury MD Requesting Provider: Nelwyn Salisbury MD Chief Complaint: constipation History of Present Illness:   77 yo white man with chronic constipation x years. He takes a dulcolax or 2  every third day with good results.  He is without heartbburn or dysphagia after EGD and dilation 04/16/09. Esophageal stricture found and omeprazole was started.   GI Review of Systems      Denies abdominal pain, acid reflux, belching, bloating, chest pain, dysphagia with liquids, dysphagia with solids, heartburn, loss of appetite, nausea, vomiting, vomiting blood, weight loss, and  weight gain.      Reports constipation.     Denies anal fissure, black tarry stools, change in bowel habit, diarrhea, diverticulosis, fecal incontinence, heme positive stool, hemorrhoids, irritable bowel syndrome, jaundice, light color stool, liver problems, rectal bleeding, and  rectal pain.    EGD  Procedure date:  04/16/2009  Findings:       ENDOSCOPIC IMPRESSION:     1) Stricture in the distal esophagus - dilated to 18 mm     2) 2 cm hiatal hernia     3) Mild gastritis     4) Otherwise normal examination.   Current Medications (verified): 1)  Centrum Silver   Tabs (Multiple Vitamins-Minerals) .... Daily 2)  Mavik 4 Mg  Tabs (Trandolapril) .... Take 1 Tablet By Mouth Once A Day 3)  Plavix 75 Mg  Tabs (Clopidogrel Bisulfate) .... Take 1 Tablet By Mouth Once A Day 4)  Flomax 0.4 Mg  Cp24 (Tamsulosin Hcl) .Marland Kitchen.. 1 By Mouth Once Daily 5)  Aspirin 81 Mg  Tbec (Aspirin) .Marland Kitchen.. 1 By Mouth Once Daily 6)  Tiazac 240 Mg  Cp24 (Diltiazem Hcl Er Beads) .Marland Kitchen.. 1 By Mouth Once Daily 7)  Nitroglycerin 0.4 Mg  Subl (Nitroglycerin) .... As Needed 8)  Allegra 180 Mg  Tabs (Fexofenadine Hcl) .... Once Daily 9)  Omeprazole 20 Mg  Cpdr (Omeprazole) .Marland Kitchen.. 1 Each Day 30 Minutes Before Meal 10)   Dulcolax 5 Mg  Tbec (Bisacodyl) .Marland Kitchen.. 1-2 By Mouth As Needed For Constipation  Allergies (verified): 1)  ! Sulfamethoxazole (Sulfamethoxazole)  Past History:  Past Medical History: AORTIC STENOSIS, MILD (ICD-424.1), last ECHO in 6-08 CAROTID ARTERY DISEASE/NONOBSTRUCTIVE (ICD-433.10), sees Dr. Hart Rochester MYOCARDIAL INFARCTION, HX OF (ICD-412) CORONARY ARTERY DISEASE (ICD-414.00), sees Dr. Valera Castle HYPERTENSION (ICD-401.9) HYPERLIPIDEMIA (ICD-272.4) VITAMIN B12 DEFICIENCY (ICD-266.2) NEUROPATHY (ICD-355.9) ALLERGIC RHINITIS (ICD-477.9) OSTEOARTHRITIS (ICD-715.90) BLADDER CANCER (ICD-188.9)Hx of, sees Dr. Gaynelle Arabian GERD and ESOPHAGEAL STRICTURE     Past Surgical History: Reviewed history from 04/09/2009 and no changes required. Angioplasty x3 Appendectomy Cholecystectomy Tonsillectomy Sigmoidoscopy 1999 Colonoscopy 08/08/02 and 2006 (diverticulosis, hemorrhoids) Bilateral elbow surgery,  Left knee replacement.  PTCA and stenting 1994 Carotid endarterectomy, left 08-17-08 per Dr. Hart Rochester  Family History: Reviewed history from 04/09/2009 and no changes required. Family History of Prostate CA 1st degree relative <50 Family History of Coronary Artery Disease:  Brother Negative for stroke and diabetes.     No FH of Colon Cancer:  Social History: Reviewed history from 09/04/2008 and no changes required. Retired Married Alcohol use-yes.Marland Kitchenoccasional Drug use-no Tobacco Use - Former. quit 1970's  Vital Signs:  Patient profile:   77 year old male Height:      68 inches Weight:      155.50 pounds Pulse rate:   80 / minute Pulse rhythm:   regular  BP sitting:   128 / 76  (left arm) Cuff size:   regular  Vitals Entered By: June McMurray CMA Duncan Dull) (June 30, 2009 1:59 PM)  Physical Exam  General:  younger thn stated age, NAD   Impression & Recommendations:  Problem # 1:  ESOPHAGEAL STRICTURE (ICD-530.3) Assessment Improved has responded to dilation and PPI will  continue omeprazole 20 mg/day  Problem # 2:  GERD (ICD-530.81) Assessment: Improved will continue omeprazole 20 mg once daily and he can see me as needed and may get future refills through PCP (Dr. Clent Ridges)  Problem # 3:  CONSTIPATION (ICD-564.00) Assessment: Unchanged will retry MiraLax daily but ok to use as needed Dulcolax 15 minutes time spent with patient on three problems  Patient Instructions: 1)  Please continue current medications. See list below and you can start MiraLax (1 dose = 1 tablespoon or cap in 8 oz water or other liquid) daily. If that does not help you you can take it twice a day and you can continue to use the dulcolax as needed. 2)  If yur swallowing roblems return call me otherwise continue follow-up with Dr. Clent Ridges. 3)  The medication list was reviewed and reconciled.  All changed / newly prescribed medications were explained.  A complete medication list was provided to the patient / caregiver.

## 2010-04-13 NOTE — Progress Notes (Signed)
Summary: refills  Phone Note Refill Request Call back at Home Phone [phone] Message from:  Patient---live call  Refills Requested: Medication #1:  TIAZAC 240 MG  CP24 1 by mouth once daily  Medication #2:  ALLEGRA 180 MG  TABS once daily   Brand Name Necessary? No [facility]  Initial call taken by: Warnell Forester,  September 30, 2009 12:30 PM    Prescriptions: ALLEGRA 180 MG  TABS (FEXOFENADINE HCL) once daily  #90 x 3   Entered and Authorized by:   Raechel Ache, RN   Signed by:   Raechel Ache, RN on 09/30/2009   Method used:   Faxed to ...       [facility] (mail-order)       [address]       [place], [place]  [address]       Ph: [phone]       Fax: [phone]   RxID:   [id number] TIAZAC 240 MG  CP24 (DILTIAZEM HCL ER BEADS) 1 by mouth once daily  #90 x 3   Entered and Authorized by:   Raechel Ache, RN   Signed by:   Raechel Ache, RN on 09/30/2009   Method used:   Faxed to ...       [facility] (mail-order)       [address]       [place], [place]  [address]       Ph: [phone]       Fax: [phone]   RxID:   [id number]

## 2010-04-13 NOTE — Assessment & Plan Note (Signed)
Summary: b12 inj/njr rt delt  Nurse Visit   Allergies: 1)  ! Sulfamethoxazole (Sulfamethoxazole)  Medication Administration  Injection # 1:    Medication: Vit B12 1000 mcg    Diagnosis: VITAMIN B12 DEFICIENCY (ICD-266.2)    Route: IM    Site: R deltoid    Exp Date: 08/2011    Lot #: [id number]    Mfr: [employer]    Patient tolerated injection without complications    Given by: Pura Spice, RN (November 27, 2009 2:28 PM)  Orders Added: 1)  Vit B12 1000 mcg [J3420] 2)  Admin of Therapeutic Inj  intramuscular or subcutaneous [16109]

## 2010-04-13 NOTE — Assessment & Plan Note (Signed)
Summary: DYSPHAGIA--CH   History of Present Illness Visit Type: consult  Primary GI MD: Stan Head MD [facility] Primary Provider: Nelwyn Salisbury MD Requesting Provider: Nelwyn Salisbury MD Chief Complaint: constipation, dysphagia, and black tarry stools History of Present Illness:   6 month hx of dysphagia has to chew very well and cut food very small or will have impact dysphagia and then eventual regurgitation last night, pills did not sem to go down, then regurgitated "clear jello" and was ok  hard, large stools unless he takes a dulcolax every 3 days stool softenes did not help Miralax once daily x 5-6 mos no help  colonoscpy 2004 (Me) and 2006 Madilyn Fireman)    GI Review of Systems    Reports dysphagia with solids and  weight loss.   Weight loss of 8 pounds   Denies abdominal pain, acid reflux, belching, bloating, chest pain, dysphagia with liquids, heartburn, loss of appetite, nausea, vomiting, vomiting blood, and  weight gain.      Reports black tarry stools and  constipation.     Denies anal fissure, change in bowel habit, diarrhea, diverticulosis, fecal incontinence, heme positive stool, hemorrhoids, irritable bowel syndrome, jaundice, light color stool, liver problems, rectal bleeding, and  rectal pain.    Current Medications (verified): 1)  Centrum Silver   Tabs (Multiple Vitamins-Minerals) .... Daily 2)  Mavik 4 Mg  Tabs (Trandolapril) .... Take 1 Tablet By Mouth Once A Day 3)  Plavix 75 Mg  Tabs (Clopidogrel Bisulfate) .... Take 1 Tablet By Mouth Once A Day 4)  Flomax 0.4 Mg  Cp24 (Tamsulosin Hcl) .Marland Kitchen.. 1 By Mouth Once Daily 5)  Aspirin 81 Mg  Tbec (Aspirin) .Marland Kitchen.. 1 By Mouth Once Daily 6)  Tiazac 240 Mg  Cp24 (Diltiazem Hcl Er Beads) .Marland Kitchen.. 1 By Mouth Once Daily 7)  Nitroglycerin 0.4 Mg  Subl (Nitroglycerin) .... As Needed 8)  Allegra 180 Mg  Tabs (Fexofenadine Hcl) .... Once Daily  Allergies (verified): 1)  ! Sulfamethoxazole (Sulfamethoxazole)  Past History:  Past Medical  History: Reviewed history from 11/05/2008 and no changes required. AORTIC STENOSIS, MILD (ICD-424.1), last ECHO in 6-08 CAROTID ARTERY DISEASE/NONOBSTRUCTIVE (ICD-433.10), sees Dr. Hart Rochester MYOCARDIAL INFARCTION, HX OF (ICD-412) CORONARY ARTERY DISEASE (ICD-414.00), sees Dr. Valera Castle HYPERTENSION (ICD-401.9) HYPERLIPIDEMIA (ICD-272.4) VITAMIN B12 DEFICIENCY (ICD-266.2) NEUROPATHY (ICD-355.9) ALLERGIC RHINITIS (ICD-477.9) OSTEOARTHRITIS (ICD-715.90) BLADDER CANCER (ICD-188.9)Hx of, sees Dr. Gaynelle Arabian     Past Surgical History: Angioplasty x3 Appendectomy Cholecystectomy Tonsillectomy Sigmoidoscopy 1999 Colonoscopy 08/08/02 and 2006 (diverticulosis, hemorrhoids) Bilateral elbow surgery,  Left knee replacement.  PTCA and stenting 1994 Carotid endarterectomy, left 08-17-08 per Dr. Hart Rochester  Family History: Family History of Prostate CA 1st degree relative <50 Family History of Coronary Artery Disease:  Brother Negative for stroke and diabetes.     No FH of Colon Cancer:  Social History: Reviewed history from 09/04/2008 and no changes required. Retired Married Alcohol use-yes.Marland Kitchenoccasional Drug use-no Tobacco Use - Former. quit 1970's  Review of Systems       The patient complains of arthritis/joint pain, hearing problems, muscle pains/cramps, and urination - excessive.         All other ROS negative except as per HPI.   Vital Signs:  Patient profile:   77 year old male Height:      68 inches Weight:      161 pounds BSA:     1.87 Pulse rate:   88 / minute Pulse rhythm:   regular BP sitting:   132 / 72  (  left arm) Cuff size:   regular  Vitals Entered By: Ok Anis CMA (April 09, 2009 2:19 PM)  Physical Exam  General:  younger thn stated age, NAD Eyes:  anicteric Mouth:  clear dentures Neck:  Supple; no masses or thyromegaly. Lungs:  a few crackles and wheezes Heart:  musical 3/6 sys murmur radiates to neck and abd s1s2 reg rate and rhythm Abdomen:   Bowel sounds positive,abdomen soft and non-tender without masses, organomegaly or hernias noted. Heart murmur heard vs. bruits Rectal:  Normal exam. heme negative Extremities:  trace bilat lower edea Neurologic:  Alert and  oriented x4; Cervical Nodes:  No significant cervical or supraclavicular adenopathy.  Psych:  Alert and cooperative. Normal mood and affect.   Impression & Recommendations:  Problem # 1:  DYSPHAGIA UNSPECIFIED (ICD-787.20) new problem  to me mild weight loss also ? peptic, motility or neoplasia - mild 6-8# weight loss and 6 months of increasing sxs and age raises increased concern for malignancy, I think  Risks, benefits,and indications of endoscopic procedure(s) were reviewed with the patient and all questions answered. Will need to be off Plavix (increasd risk). I have explained risk of vascular events off Plavix but that unable to dilate while on. No active coronary or vascular sxs so stop without other MD input. Orders: ZEGD (ZEGD) + dilation  Problem # 2:  CONSTIPATION (ICD-564.00) Assessment: New ? medications negative colonoscopies 2004 and 6 except diverticulosis which may be part of it. Will try MiraLax two times a day +/- dulcolax at is age and with comorbidities would not initiate colonoscopy based upon what I know at this point and need to know what cause of dysphagia is  Patient Instructions: 1)  Avoid meats as much as possible unless they are soft. Chew well and eat slowly, drink fluids after eating and reman upright while eating. Take 1 pill at a time. 2)  Hold your Plavix starting tomorrow. 3)  We will see you at your procedure on 04/16/09 at [facility]. 4)  Begin using Miralax two times a day. 5)  Copy sent to : Gershon Crane, MD 6)  The medication list was reviewed and reconciled.  All changed / newly prescribed medications were explained.  A complete medication list was provided to the patient / caregiver.

## 2010-04-13 NOTE — Miscellaneous (Signed)
Summary: omeprazole rx

## 2010-04-13 NOTE — Assessment & Plan Note (Signed)
Summary: f1y per pt call/lg   Visit Type:  1 yr f/u Referring Jagar Lua:  Nelwyn Salisbury MD Primary Iyona Pehrson:  Nelwyn Salisbury MD  CC:  no cardiac complaints today.  History of Present Illness: Thomas [name] comes in today for followup of his cardiac and vascular disease.  He continues to thrive at 77 years of age. He is in his usual good sensing you were. He denies any angina or ischemic symptoms. He's had no symptoms of TIAs or mini strokes. His carotid endarterectomy has been successful and is followed by Dr. Hart Rochester.  Denies any presyncope or syncope. He denies palpitations.  Current Medications (verified): 1)  Centrum Silver   Tabs (Multiple Vitamins-Minerals) .... Daily 2)  Mavik 4 Mg  Tabs (Trandolapril) .... Take 1 Tablet By Mouth Once A Day 3)  Plavix 75 Mg  Tabs (Clopidogrel Bisulfate) .... Take 1 Tablet By Mouth Once A Day 4)  Flomax 0.4 Mg  Cp24 (Tamsulosin Hcl) .Marland Kitchen.. 1 By Mouth Once Daily 5)  Aspirin 81 Mg  Tbec (Aspirin) .Marland Kitchen.. 1 By Mouth Once Daily 6)  Tiazac 240 Mg  Cp24 (Diltiazem Hcl Er Beads) .Marland Kitchen.. 1 By Mouth Once Daily 7)  Nitroglycerin 0.4 Mg  Subl (Nitroglycerin) .... As Needed 8)  Omeprazole 20 Mg  Cpdr (Omeprazole) .Marland Kitchen.. 1 Each Day 30 Minutes Before Meal 9)  Dulcolax 5 Mg  Tbec (Bisacodyl) .Marland Kitchen.. 1-2 By Mouth As Needed For Constipation 10)  Miralax   Powd (Polyethylene Glycol 3350) .Marland Kitchen.. 1-2 Doses Daily  Allergies: 1)  ! Sulfamethoxazole (Sulfamethoxazole)  Past History:  Past Medical History: Last updated: 06/30/2009 AORTIC STENOSIS, MILD (ICD-424.1), last ECHO in 6-08 CAROTID ARTERY DISEASE/NONOBSTRUCTIVE (ICD-433.10), sees Dr. Hart Rochester MYOCARDIAL INFARCTION, HX OF (ICD-412) CORONARY ARTERY DISEASE (ICD-414.00), sees Dr. Valera Castle HYPERTENSION (ICD-401.9) HYPERLIPIDEMIA (ICD-272.4) VITAMIN B12 DEFICIENCY (ICD-266.2) NEUROPATHY (ICD-355.9) ALLERGIC RHINITIS (ICD-477.9) OSTEOARTHRITIS (ICD-715.90) BLADDER CANCER (ICD-188.9)Hx of, sees Dr. Gaynelle Arabian GERD and  ESOPHAGEAL STRICTURE     Past Surgical History: Last updated: 04/09/2009 Angioplasty x3 Appendectomy Cholecystectomy Tonsillectomy Sigmoidoscopy 1999 Colonoscopy 08/08/02 and 2006 (diverticulosis, hemorrhoids) Bilateral elbow surgery,  Left knee replacement.  PTCA and stenting 1994 Carotid endarterectomy, left 08-17-08 per Dr. Hart Rochester  Family History: Last updated: 04/09/2009 Family History of Prostate CA 1st degree relative <50 Family History of Coronary Artery Disease:  Brother Negative for stroke and diabetes.     No FH of Colon Cancer:  Social History: Last updated: 09/04/2008 Retired Married Alcohol use-yes.Marland Kitchenoccasional Drug use-no Tobacco Use - Former. quit 1970's  Risk Factors: Smoking Status: quit (09/04/2008)  Review of Systems       negative other than history of present illness  Vital Signs:  Patient profile:   77 year old male Height:      68 inches Weight:      158.8 pounds BMI:     24.23 Pulse rate:   64 / minute Pulse rhythm:   irregular BP sitting:   118 / 70  (left arm) Cuff size:   large  Vitals Entered By: Danielle Rankin, CMA (November 27, 2009 12:10 PM)  Physical Exam  General:  elderly, in no acute distress Head:  normocephalic and atraumatic Eyes:  glasses otherwise normal Neck:  Neck supple, no JVD. No masses, thyromegaly or abnormal cervical nodes. Lungs:  Clear bilaterally to auscultation and percussion. Heart:  PMI nondisplaced, soft murmur at the left lower sternal border, regular rate and rhythm, S2 split. Left carotid bruit Msk:  decreased ROM.   Pulses:  reduced but  present lower extremity Extremities:  No clubbing or cyanosis. Neurologic:  Alert and oriented x 3. Skin:  Intact without lesions or rashes. Psych:  Normal affect.   Impression & Recommendations:  Problem # 1:  CAROTID ARTERY DISEASE/NONOBSTRUCTIVE (ICD-433.10) Assessment Unchanged  His updated medication list for this problem includes:    Plavix 75 Mg Tabs  (Clopidogrel bisulfate) .Marland Kitchen... Take 1 tablet by mouth once a day    Aspirin 81 Mg Tbec (Aspirin) .Marland Kitchen... 1 by mouth once daily  Problem # 2:  AORTIC STENOSIS, MILD (ICD-424.1) Assessment: Unchanged  His updated medication list for this problem includes:    Mavik 4 Mg Tabs (Trandolapril) .Marland Kitchen... Take 1 tablet by mouth once a day    Nitroglycerin 0.4 Mg Subl (Nitroglycerin) .Marland Kitchen... As needed  Problem # 3:  CORONARY ARTERY DISEASE (ICD-414.00) Assessment: Unchanged Will continue medical therapy His updated medication list for this problem includes:    Mavik 4 Mg Tabs (Trandolapril) .Marland Kitchen... Take 1 tablet by mouth once a day    Plavix 75 Mg Tabs (Clopidogrel bisulfate) .Marland Kitchen... Take 1 tablet by mouth once a day    Aspirin 81 Mg Tbec (Aspirin) .Marland Kitchen... 1 by mouth once daily    Tiazac 240 Mg Cp24 (Diltiazem hcl er beads) .Marland Kitchen... 1 by mouth once daily    Nitroglycerin 0.4 Mg Subl (Nitroglycerin) .Marland Kitchen... As needed  Orders: EKG w/ Interpretation (93000)  Problem # 4:  MYOCARDIAL INFARCTION, HX OF (ICD-412) Assessment: Unchanged  His updated medication list for this problem includes:    Mavik 4 Mg Tabs (Trandolapril) .Marland Kitchen... Take 1 tablet by mouth once a day    Plavix 75 Mg Tabs (Clopidogrel bisulfate) .Marland Kitchen... Take 1 tablet by mouth once a day    Aspirin 81 Mg Tbec (Aspirin) .Marland Kitchen... 1 by mouth once daily    Tiazac 240 Mg Cp24 (Diltiazem hcl er beads) .Marland Kitchen... 1 by mouth once daily    Nitroglycerin 0.4 Mg Subl (Nitroglycerin) .Marland Kitchen... As needed  Clinical Reports Reviewed:  Cardiac Cath:  02/04/2004: Cardiac Cath Findings:   CV History:  Asymptomatic.   Amaurosis Fugax No, Paresthesias No, Hemiparesis No                                          RIGHT             LEFT   Brachial systolic pressure:         144               146   Brachial Doppler waveforms:         Normal            Normal   Vertebral direction of flow:        Antegrade         Antegrade   DUPLEX VELOCITIES (cm/sec)   CCA peak systolic                    80                92   ECA peak systolic                   88                89   ICA peak systolic  70                353   ICA end diastolic                   15                84   PLAQUE MORPHOLOGY:                  Mixed             Mixed   PLAQUE AMOUNT:                      Mild              Moderate/severe   PLAQUE LOCATION:                    ICA/ECA           ICA/ECA/CCA      IMPRESSION:   1. 1-39% stenosis of the right internal carotid artery.   2. High end 60-79% stenosis of the left internal carotid artery.   3. No significant change noted when compared to the previous exam on       07/31/2007.      ___________________________________________   Quita Skye. Hart Rochester, M.D.      CH/MEDQ  D:  01/31/2008  T:  01/31/2008  Job:  [id number]   12/08/1992: Cardiac Cath Findings:  Overall Impression: Thomas. Hamza is a 76-year -old gentleman staus post circumflex and right coronary artery angioplasty post inferior myocardial infarction with restenosis three weeks later requiring redilatation, now with recuurent symptoms, however, no angiographic evidence of restenosis documented. Plans will be for medical therapy. Heparin will be discontinued.  The sheaths were removed after____________was measured. Pressure was held in the groin to achieve hemostasis. The patient left the lab in stable condition.  The scintiangiograms were reviewed with Dr. Juanito Doom and Dr. Bonnee Quin.  Runell Gess, MD  CXR:  08/14/2008: CXR Results:   Clinical Data: Carotid stenosis, preop.  Hypertension, smoker.    CHEST - 2 VIEW    Comparison: 02/02/2004    Findings: There is hyperinflation of the lungs compatible with   COPD.  There is tortuosity of the thoracic aorta.  Linear densities   in the left lung base compatible with scarring or atelectasis.   Heart is normal size.  Right lung clear.  No effusions or acute   bony abnormality.    IMPRESSION:   COPD.  Left base scar or  atelectasis.    Read By:  Charlett Nose,  M.D.   Released By:  Charlett Nose,  M.D.  Additional Information  HL7 RESULT STATUS : F  External image : [phone]  External IF Update Timestamp : 2008-08-14:15:26:10.000000  02/02/2004: CXR Results:   Clinical Data:  Chest pain.   CHEST 2 VIEW, 02/02/04:   Heart and mediastinal contours are within normal limits.  There are   mild COPD changes.  No focal airspace opacities or effusions.   Degenerative changes in the thoracic spine.   IMPRESSION:   Mild COPD.  No active disease.    Read By:  Charlett Nose,  M.D.   Released By:  Charlett Nose,  M.D.  Additional Information  External image : [phone]  Nuclear Study:  04/22/2003:  Final Impression: Adenosine Cardiolite with small inferobasal wall infarction. No evidence of ischemia. Ejection fraction was 74%. There has ben no  significant change since the Cardiolite study done in October 2002.  Wendall Stade, MD, [facility]  01/09/2001:  Impression: Stress Cardiolite study with significant hypertension. No evidence of ischemia. There was aa small inferobasal wall infarction unchanged from previous studies. Ejection fraction was 63%.  Noralyn Pick. Eden Emms, MD, [facility]   Patient Instructions: 1)  Your physician recommends that you schedule a follow-up appointment in: 1 year with Dr. Daleen Squibb 2)  Your physician recommends that you continue on your current medications as directed. Please refer to the Current Medication list given to you today.

## 2010-04-13 NOTE — Assessment & Plan Note (Signed)
Summary: B12 INJ // RS /lleft delt  Nurse Visit   Allergies: 1)  ! Sulfamethoxazole (Sulfamethoxazole)  Medication Administration  Injection # 1:    Medication: Vit B12 1000 mcg    Diagnosis: VITAMIN B12 DEFICIENCY (ICD-266.2)    Route: IM    Site: L deltoid    Exp Date: 09/2011    Lot #: [id number]    Mfr: [employer] Regent    Patient tolerated injection without complications    Given by: Pura Spice, RN (December 28, 2009 2:09 PM)  Orders Added: 1)  Vit B12 1000 mcg [J3420] 2)  Admin of Therapeutic Inj  intramuscular or subcutaneous [95188]

## 2010-04-13 NOTE — Procedures (Signed)
Summary: Colonoscopy:    Colonoscopy  Procedure date:  08/08/2002  Findings:      Results: Hemorrhoids.     Results: Diverticulosis.       Location:  [facility].   Patient Name: Thomas [name], Thomas [name] MRN:  Procedure Procedures: Colonoscopy CPT: 25427.  Personnel: Endoscopist: Iva Boop, MD, [facility].  Referred By: Gershon Crane, MD.  Exam Location: Exam performed in Outpatient Clinic. Outpatient  Patient Consent: Procedure, Alternatives, Risks and Benefits discussed, consent obtained, from patient. Consent was obtained by the RN.  Indications  Average Risk Screening Routine.  History  Pre-Exam Physical: Performed Aug 08, 2002. Cardio-pulmonary exam, Rectal exam, HEENT exam , Abdominal exam, Mental status exam WNL.  Exam Exam: Extent of exam reached: Cecum, extent intended: Cecum.  The cecum was identified by appendiceal orifice and IC valve. Patient position: left side to back. Colon retroflexion performed. Images taken. ASA Classification: III. Tolerance: excellent.  Monitoring: Pulse and BP monitoring, Oximetry used. Supplemental O2 given.  Colon Prep Used Golytely for colon prep. Prep results: excellent.  Sedation Meds: Patient assessed and found to be appropriate for moderate (conscious) sedation. Sedation was managed by the Endoscopist. Fentanyl 50 mcg. given IV. Versed 5 mg. given IV.  Findings - DIVERTICULOSIS: Descending Colon to Sigmoid Colon. Not bleeding. ICD9: Diverticulosis, Colon: 562.10. Comments: SEVERE.  - NORMAL EXAM: Cecum to Descending Colon.  HEMORRHOIDS: External. Size: Grade I. Not bleeding. Not thrombosed. ICD9: Hemorrhoids, External: 455.3.   Assessment  Diagnoses: 562.10: Diverticulosis, Colon.  455.3: Hemorrhoids, External.   Comments: SEVERE LEFT-SIDED DIVERTICULOSIS AND HEMORRHOIDS NO POLYPS SEEN Events  Unplanned Interventions: No intervention was required.  Plans Patient Education: Patient given standard  instructions for: Diverticulosis. Hemorrhoids.  Disposition: After procedure patient sent to recovery. After recovery patient sent home.  Scheduling/Referral: Primary Care Provider, to Gershon Crane, MD, AS PLANNED FOR ROUTINE CARE,   CC:   Gershon Crane, MD  This report was created from the original endoscopy report, which was reviewed and signed by the above listed endoscopist.

## 2010-04-13 NOTE — Progress Notes (Signed)
Summary: Triage  Phone Note Call from Patient Call back at Home Phone [phone]   Caller: Patient Call For: Dr. Leone Payor Reason for Call: Talk to Nurse Summary of Call: Pt. has an appt. scheduled w/Dr. Leone Payor on 05-07-09 and is having problems swallowing. Wants to know if he can be seen sooner. Initial call taken by: Karna Christmas,  April 06, 2009 10:44 AM  Follow-up for Phone Call        Patient  wants to also schedule for colon.  He is on plavix.  He will come in and speak with Dr Leone Payor on 04-09-09 2:30 Follow-up by: Darcey Nora RN, CGRN,  April 06, 2009 11:06 AM

## 2010-04-13 NOTE — Assessment & Plan Note (Signed)
Summary: B12 INJ//CCM  Nurse Visit   Allergies: 1)  ! Sulfamethoxazole (Sulfamethoxazole)  Medication Administration  Injection # 1:    Medication: Vit B12 1000 mcg    Diagnosis: VITAMIN B12 DEFICIENCY (ICD-266.2)    Route: IM    Site: L deltoid    Exp Date: 11/2010    Lot #: [id number]    Mfr: [employer] Regent    Patient tolerated injection without complications    Given by: Raechel Ache, RN (June 01, 2009 11:04 AM)  Orders Added: 1)  Vit B12 1000 mcg [J3420] 2)  Admin of Therapeutic Inj  intramuscular or subcutaneous [96045]

## 2010-04-13 NOTE — Procedures (Signed)
Summary: Colonoscopy:    Colonoscopy  Procedure date:  05/31/2004  Findings:      Location:  [facility].   NAME:  Thomas [name], Thomas [name]               ACCOUNT NO.:  [id number]   MEDICAL RECORD NO.:  [id number]          PATIENT TYPE:  AMB   LOCATION:  ENDO                         FACILITY:  [facility]   PHYSICIAN:  John C. Madilyn Fireman, M.D.    DATE OF BIRTH:  1917-01-09   DATE OF PROCEDURE:  05/31/2004  DATE OF DISCHARGE:                                 OPERATIVE REPORT   INDICATIONS FOR PROCEDURE:  History of adenomatous colon polyps.   PROCEDURE:  The patient was placed in the left lateral decubitus position  and placed on the pulse monitor with continuous low-flow oxygen delivered by  nasal cannula. He was sedated with 62.5 mcg IV fentanyl 7 mg IV Versed. The  Olympus video colonoscope was inserted into the rectum and advanced to the  cecum, confirmed by transillumination of McBurney's point and visualization  of ileocecal valve and appendiceal orifice. Prep was good. The cecum,  ascending, transverse, descending colon all appeared normal with no masses,  polyps, diverticula or other mucosal abnormalities. In the sigmoid colon,  there was seen a few scattered diverticula and no other abnormalities. The  rectum appeared normal. Retroflexed view of the anus revealed no obvious  internal hemorrhoids. Scope was then withdrawn and the patient returned to  the recovery room in stable condition. He tolerated the procedure well and  there were no immediate complications.   IMPRESSION:  Diverticulosis, otherwise normal study.   PLAN:  Repeat colonoscopy in 5 years based on his prior history of polyps.      J[facility]/MEDQ  D:  05/31/2004  T:  05/31/2004  Job:  [id number]   cc:   Dr. Trenton Gammon

## 2010-04-13 NOTE — Letter (Signed)
Summary: EGD Instructions  [facility]  [address] [place], [place] [address]   Phone: [phone]  Fax: [phone]       Thomas [name]    [birth year]    MRN: [id number]       Procedure Day /Date: Thursday, 04/16/09     Arrival Time: 1:30pm     Procedure Time: 2:30pm     Location of Procedure:                    _X_ [facility] ( Outpatient Registration)   PREPARATION FOR ENDOSCOPY   On Thursday, 04/16/09 THE DAY OF THE PROCEDURE:  1.   No solid foods, milk or milk products are allowed after midnight the night before your procedure.  2.   Do not drink anything colored red or purple.  Avoid juices with pulp.  No orange juice.  3.  You may drink clear liquids until10:30am, which is 4 hours before your procedure.                                                                                                CLEAR LIQUIDS INCLUDE: Water Jello Ice Popsicles Tea (sugar ok, no milk/cream) Powdered fruit flavored drinks Coffee (sugar ok, no milk/cream) Gatorade Juice: apple, white grape, white cranberry  Lemonade Clear bullion, consomm, broth Carbonated beverages (any kind) Strained chicken noodle soup Hard Candy   MEDICATION INSTRUCTIONS  Unless otherwise instructed, you should take regular prescription medications with a small sip of water as early as possible the morning of your procedure.  Stop taking Plavix or Aggrenox on  04/10/09  (7 days before procedure).     Additional medication instructions: None             OTHER INSTRUCTIONS  You will need a responsible adult at least 75 years of age to accompany you and drive you home.   This person must remain in the waiting room during your procedure.  Wear loose fitting clothing that is easily removed.  Leave jewelry and other valuables at home.  However, you may wish to bring a book to read or an iPod/MP3 player to listen to music as you wait for your procedure to start.  Remove all  body piercing jewelry and leave at home.  Total time from sign-in until discharge is approximately 2-3 hours.  You should go home directly after your procedure and rest.  You can resume normal activities the day after your procedure.  The day of your procedure you should not:   Drive   Make legal decisions   Operate machinery   Drink alcohol   Return to work  You will receive specific instructions about eating, activities and medications before you leave.    The above instructions have been reviewed and explained to me by   _______________________    I fully understand and can verbalize these instructions _____________________________ Date _________

## 2010-04-15 NOTE — Assessment & Plan Note (Signed)
Summary: b12 inj/njr  Nurse Visit   Allergies: 1)  ! Sulfamethoxazole (Sulfamethoxazole)  Medication Administration  Injection # 1:    Medication: Vit B12 1000 mcg    Diagnosis: VITAMIN B12 DEFICIENCY (ICD-266.2)    Route: IM    Site: L deltoid    Exp Date: 10/13/2011    Lot #: [id number]    Mfr: [employer]    Patient tolerated injection without complications    Given by: Sid Falcon LPN (March 09, 2010 5:34 PM)  Orders Added: 1)  Vit B12 1000 mcg [J3420] 2)  Admin of Therapeutic Inj  intramuscular or subcutaneous [04540]

## 2010-04-21 NOTE — Assessment & Plan Note (Signed)
Summary: consult re: cough and fatigue/cjr   Vital Signs:  Patient profile:   77 year old male Weight:      155 pounds O2 Sat:      95 % Temp:     97.2 degrees F Pulse rate:   92 / minute BP sitting:   136 / 84  (left arm) Cuff size:   regular  Vitals Entered By: Pura Spice, RN (April 13, 2010 1:36 PM) CC: stated had new carpet put in and now cough sore throat clear thick sputum x 8 days   History of Present Illness: Here with his wife for 8 days of chest congestion, SOB, coughing up whitish sputum, and weakness. No fever or chest pains. Drinking fluids.   Allergies: 1)  ! Sulfamethoxazole (Sulfamethoxazole)  Past History:  Past Medical History: Reviewed history from 06/30/2009 and no changes required. AORTIC STENOSIS, MILD (ICD-424.1), last ECHO in 6-08 CAROTID ARTERY DISEASE/NONOBSTRUCTIVE (ICD-433.10), sees Dr. Hart Rochester MYOCARDIAL INFARCTION, HX OF (ICD-412) CORONARY ARTERY DISEASE (ICD-414.00), sees Dr. Valera Castle HYPERTENSION (ICD-401.9) HYPERLIPIDEMIA (ICD-272.4) VITAMIN B12 DEFICIENCY (ICD-266.2) NEUROPATHY (ICD-355.9) ALLERGIC RHINITIS (ICD-477.9) OSTEOARTHRITIS (ICD-715.90) BLADDER CANCER (ICD-188.9)Hx of, sees Dr. Gaynelle Arabian GERD and ESOPHAGEAL STRICTURE     Past Surgical History: Reviewed history from 04/09/2009 and no changes required. Angioplasty x3 Appendectomy Cholecystectomy Tonsillectomy Sigmoidoscopy 1999 Colonoscopy 08/08/02 and 2006 (diverticulosis, hemorrhoids) Bilateral elbow surgery,  Left knee replacement.  PTCA and stenting 1994 Carotid endarterectomy, left 08-17-08 per Dr. Hart Rochester  Social History: Reviewed history from 09/04/2008 and no changes required. Retired Married Alcohol use-yes.Marland Kitchenoccasional Drug use-no Tobacco Use - Former. quit 1970's  Review of Systems  The patient denies anorexia, fever, weight loss, weight gain, vision loss, decreased hearing, hoarseness, chest pain, syncope, peripheral edema, headaches,  hemoptysis, abdominal pain, melena, hematochezia, severe indigestion/heartburn, hematuria, incontinence, genital sores, muscle weakness, suspicious skin lesions, transient blindness, difficulty walking, depression, unusual weight change, abnormal bleeding, enlarged lymph nodes, angioedema, breast masses, and testicular masses.    Physical Exam  General:  walks with a cane, alert but very weak  Head:  Normocephalic and atraumatic without obvious abnormalities. No apparent alopecia or balding. Eyes:  No corneal or conjunctival inflammation noted. EOMI. Perrla. Funduscopic exam benign, without hemorrhages, exudates or papilledema. Vision grossly normal. Ears:  External ear exam shows no significant lesions or deformities.  Otoscopic examination reveals clear canals, tympanic membranes are intact bilaterally without bulging, retraction, inflammation or discharge. Hearing is grossly normal bilaterally. Nose:  External nasal examination shows no deformity or inflammation. Nasal mucosa are pink and moist without lesions or exudates. Mouth:  Oral mucosa and oropharynx without lesions or exudates.  Teeth in good repair. Neck:  No deformities, masses, or tenderness noted. Lungs:  scattered wheezes and rhonchi, no rales  Heart:  normal rate, regular rhythm, no gallop, no rub, no JVD, and no HJR.  Has a 2/6 SM    Impression & Recommendations:  Problem # 1:  ACUTE BRONCHITIS (ICD-466.0)  His updated medication list for this problem includes:    Zithromax Z-pak 250 Mg Tabs (Azithromycin) .Marland Kitchen... As directed  Complete Medication List: 1)  Centrum Silver Tabs (Multiple vitamins-minerals) .... Daily 2)  Mavik 4 Mg Tabs (Trandolapril) .... Take 1 tablet by mouth once a day 3)  Plavix 75 Mg Tabs (Clopidogrel bisulfate) .... Take 1 tablet by mouth once a day 4)  Flomax 0.4 Mg Cp24 (Tamsulosin hcl) .Marland Kitchen.. 1 by mouth once daily 5)  Aspirin 81 Mg Tbec (Aspirin) .Marland Kitchen.. 1 by mouth once daily  6)  Tiazac 240 Mg Cp24  (Diltiazem hcl er beads) .Marland Kitchen.. 1 by mouth once daily 7)  Nitroglycerin 0.4 Mg Subl (Nitroglycerin) .... As needed 8)  Omeprazole 20 Mg Cpdr (Omeprazole) .Marland Kitchen.. 1 each day 30 minutes before meal 9)  Dulcolax 5 Mg Tbec (Bisacodyl) .Marland Kitchen.. 1-2 by mouth as needed for constipation 10)  Miralax Powd (Polyethylene glycol 3350) .Marland Kitchen.. 1-2 doses daily 11)  Zithromax Z-pak 250 Mg Tabs (Azithromycin) .... As directed  Other Orders: T-2 View CXR (71020TC)  Patient Instructions: 1)  we will get a CXR today  Prescriptions: ZITHROMAX Z-PAK 250 MG TABS (AZITHROMYCIN) as directed  #1 x 0   Entered and Authorized by:   Nelwyn Salisbury MD   Signed by:   Nelwyn Salisbury MD on 04/13/2010   Method used:   Electronically to        [facility]. [phone] (retail)       [phone]       [place], [place]  [address]       Ph: [phone]       Fax: [phone]   RxID:   [id number]    Orders Added: 1)  Est. Patient Level IV [66440] 2)  T-2 View CXR [[id number]]

## 2010-05-03 ENCOUNTER — Ambulatory Visit (INDEPENDENT_AMBULATORY_CARE_PROVIDER_SITE_OTHER): Payer: Medicare Other | Admitting: Family Medicine

## 2010-05-03 ENCOUNTER — Telehealth: Payer: Self-pay | Admitting: Family Medicine

## 2010-05-03 DIAGNOSIS — I10 Essential (primary) hypertension: Secondary | ICD-10-CM

## 2010-05-03 DIAGNOSIS — N400 Enlarged prostate without lower urinary tract symptoms: Secondary | ICD-10-CM

## 2010-05-03 DIAGNOSIS — D649 Anemia, unspecified: Secondary | ICD-10-CM

## 2010-05-03 MED ORDER — CYANOCOBALAMIN 1000 MCG/ML IJ SOLN
1000.0000 ug | Freq: Once | INTRAMUSCULAR | Status: AC
  Administered 2010-05-03: 1000 ug via INTRAMUSCULAR

## 2010-05-03 MED ORDER — TRANDOLAPRIL 4 MG PO TABS: 4.0000 mg | ORAL_TABLET | Freq: Every day | ORAL | Status: DC

## 2010-05-03 MED ORDER — TAMSULOSIN HCL 0.4 MG PO CAPS: 0.4000 mg | ORAL_CAPSULE | Freq: Every day | ORAL | Status: DC

## 2010-05-03 NOTE — Telephone Encounter (Signed)
Rx refill of tamsulosin HCL 4mg  and prandolapril 4mg  90 day supply sent to [facility]

## 2010-06-21 LAB — BASIC METABOLIC PANEL
BUN: 14 mg/dL (ref 6–23)
Chloride: 108 mEq/L (ref 96–112)
Glucose, Bld: 124 mg/dL — ABNORMAL HIGH (ref 70–99)
Potassium: 3.7 mEq/L (ref 3.5–5.1)
Sodium: 141 mEq/L (ref 135–145)

## 2010-06-21 LAB — URINALYSIS, ROUTINE W REFLEX MICROSCOPIC
Bilirubin Urine: NEGATIVE
Hgb urine dipstick: NEGATIVE
Ketones, ur: NEGATIVE mg/dL
Nitrite: NEGATIVE
Urobilinogen, UA: 1 mg/dL (ref 0.0–1.0)

## 2010-06-21 LAB — COMPREHENSIVE METABOLIC PANEL
Alkaline Phosphatase: 55 U/L (ref 39–117)
BUN: 22 mg/dL (ref 6–23)
CO2: 25 mEq/L (ref 19–32)
Chloride: 106 mEq/L (ref 96–112)
Creatinine, Ser: 1.51 mg/dL — ABNORMAL HIGH (ref 0.4–1.5)
GFR calc non Af Amer: 44 mL/min — ABNORMAL LOW (ref >60)
Glucose, Bld: 102 mg/dL — ABNORMAL HIGH (ref 70–99)
Total Bilirubin: 0.6 mg/dL (ref 0.3–1.2)

## 2010-06-21 LAB — URINE MICROSCOPIC-ADD ON

## 2010-06-21 LAB — CBC
HCT: 38.3 % — ABNORMAL LOW (ref 39.0–52.0)
HCT: 43 % (ref 39.0–52.0)
Hemoglobin: 13.1 g/dL (ref 13.0–17.0)
Hemoglobin: 15 g/dL (ref 13.0–17.0)
MCV: 95.6 fL (ref 78.0–100.0)
MCV: 96.2 fL (ref 78.0–100.0)
Platelets: 173 10*3/uL (ref 150–400)
Platelets: 205 10*3/uL (ref 150–400)
RBC: 4.5 MIL/uL (ref 4.22–5.81)
RDW: 12.6 % (ref 11.5–15.5)
WBC: 5.7 10*3/uL (ref 4.0–10.5)

## 2010-06-21 LAB — PROTIME-INR
INR: 1.1 (ref 0.00–1.49)
Prothrombin Time: 14.1 seconds (ref 11.6–15.2)

## 2010-06-21 LAB — APTT: aPTT: 30 seconds (ref 24–37)

## 2010-07-06 ENCOUNTER — Ambulatory Visit (INDEPENDENT_AMBULATORY_CARE_PROVIDER_SITE_OTHER): Payer: Medicare Other | Admitting: Family Medicine

## 2010-07-06 DIAGNOSIS — E538 Deficiency of other specified B group vitamins: Secondary | ICD-10-CM

## 2010-07-06 MED ORDER — CYANOCOBALAMIN 1000 MCG/ML IJ SOLN
1000.0000 ug | Freq: Once | INTRAMUSCULAR | Status: AC
  Administered 2010-07-06: 1000 ug via INTRAMUSCULAR

## 2010-07-27 NOTE — H&P (Signed)
HISTORY AND PHYSICAL EXAMINATION   August 12, 2008   Re:  Thomas [name], Thomas [name]               DOB:  16-Mar-1916   CHIEF COMPLAINT:  Severe asymptomatic left internal carotid stenosis -  progressive.   HISTORY OF PRESENT ILLNESS:  This 77 year old active male patient has  been followed by me for several years for carotid occlusive disease  which has been quite stable.  He returned on 08/12/2008 for his routine  followup and carotid duplex revealed dramatic progression of disease in  his left internal carotid artery to greater than 95% in severity.  He  denies any hemispheric or nonhemispheric TIAs, amaurosis fugax,  diplopia, blurred vision or syncope.  He does have decreased vision in  his left eye secondary to glaucoma.  He is now scheduled for left  carotid endarterectomy for this asymptomatic lesion.   PAST MEDICAL HISTORY:  1. Hypertension.  2. Coronary artery disease status post myocardial infarction 1995 with      PTCA and stenting having been stable for the past 15 years.  3. Hyperlipidemia.  4. Negative for diabetes, COPD or stroke.   PAST SURGICAL HISTORY:  1. Appendectomy.  2. Cholecystectomy.  3. Bilateral elbow surgery,  4. Left knee replacement.  5. Bladder cancer removal x2.  6. PTCA and stenting 1994.   FAMILY HISTORY:  Positive for coronary artery disease in his brother.  Negative for stroke and diabetes.   SOCIAL HISTORY:  He is married, has two children and is retired.  Has  not smoked since 1970 and drinks occasional alcohol.   REVIEW OF SYSTEMS:  Denies any chest pain, dyspnea on exertion, PND,  orthopnea, anorexia, weight loss.  No hemoptysis or wheezing.  Does have  some difficulty in lower extremities with walking and arthritis and  muscle pain and decreased hearing acuity.   ALLERGIES:  To sulfa.   MEDICATIONS:  1. Plavix 75 mg one daily.  2. Mavik 4 mg one daily.  3. Diltiazem 240 mg one daily.  4. Flomax 0.4 mg one daily.  5.  Aspirin 81 mg one daily.   PHYSICAL EXAM:  Vital signs:  Blood pressure 142/74, heart rate 72,  respirations 14.  General:  He is a healthy-appearing elderly male who  appears younger than his stated age of 29, alert and oriented x3.  Neck:  Supple, 3+ carotid pulses with a harsh bruit on the left side.  Neurologic:  Normal.  No palpable adenopathy in the neck.  Chest:  Clear  to auscultation.  Cardiovascular:  Regular rhythm.  No murmurs.  Abdomen:  Soft, nontender with no masses.  He has 3+ femoral and  popliteal pulses bilaterally with well-perfused lower extremities.   Carotid duplex exam in [facility] office on 08/08/2008 reveals a 95+ percent  left internal carotid stenosis with no flow reduction in the right  internal carotid.   IMPRESSION:  1. Severe asymptomatic left internal carotid stenosis - progressive.  2. Hypertension.  3. Hyperlipidemia.  4. Coronary artery disease - stable.   PLAN:  Is to admit the patient on June 4 for an elective left carotid  endarterectomy for this progressive lesion.  Risks and benefits have  been thoroughly discussed.  The patient would like to proceed.   Quita Skye Hart Rochester, M.D.  Electronically Signed   JDL/MEDQ  D:  08/12/2008  T:  08/13/2008  Job:  [id number]   cc:   Jeannett Senior A. Clent Ridges, MD  Jesse Sans.  Wall, MD, [facility]

## 2010-07-27 NOTE — Procedures (Signed)
CAROTID DUPLEX EXAM   INDICATION:  Follow up of known coronary artery disease.   HISTORY:  Diabetes:  No.  Cardiac:  MI with PTCA in 1995.  Hypertension:  Yes.  Smoking:  No.  Previous Surgery:  No.  CV History:  No.  Amaurosis Fugax No, Paresthesias No, Hemiparesis No                                       RIGHT             LEFT  Brachial systolic pressure:         150               150  Brachial Doppler waveforms:         Biphasic.         Biphasic.  Vertebral direction of flow:        Antegrade.        Antegrade.  DUPLEX VELOCITIES (cm/sec)  CCA peak systolic                   80                70  ECA peak systolic                   65                147  ICA peak systolic                   60                332  ICA end diastolic                   13                80  PLAQUE MORPHOLOGY:                  Heterogenous.     Heterogenous.  PLAQUE AMOUNT:                      Mild.             Moderate.  PLAQUE LOCATION:                    ICA, ECA.         ICA, ECA.   IMPRESSION:  A 60-79% stenosis noted in the left internal carotid  artery, 20-39% stenosis noted in the right internal carotid artery.  Antegrade bilateral vertebral arteries.   ___________________________________________  Quita Skye Hart Rochester, M.D.   MG/MEDQ  D:  01/16/2007  T:  01/17/2007  Job:  [id number]

## 2010-07-27 NOTE — Procedures (Signed)
CAROTID DUPLEX EXAM   INDICATION:  Status post left carotid endarterectomy.   HISTORY:  Diabetes:  No.  Cardiac:  MI, angioplasty.  Hypertension:  Yes.  Smoking:  No.  Previous Surgery:  Left carotid endarterectomy on 08/15/08.  CV History:  Currently asymptomatic.  Amaurosis Fugax No, Paresthesias No, Hemiparesis No.                                       RIGHT             LEFT  Brachial systolic pressure:         152               156  Brachial Doppler waveforms:         Normal            Normal  Vertebral direction of flow:        Antegrade         Antegrade  DUPLEX VELOCITIES (cm/sec)  CCA peak systolic                   73                68  ECA peak systolic                   128               56  ICA peak systolic                   76                64  ICA end diastolic                   14                18  PLAQUE MORPHOLOGY:                  Mixed             Heterogenous  PLAQUE AMOUNT:                      Mild              Mild  PLAQUE LOCATION:                    ICA/ECA           CCA   IMPRESSION:  1. 1-39% stenosis of the right internal carotid artery.  2. Patent left carotid endarterectomy site with no left internal      carotid artery stenosis.  3. Significant improvement of the left internal carotid artery noted      when compared to the previous examination on 08/08/08 with the      right internal carotid artery remaining stable.   ___________________________________________  Quita Skye. Hart Rochester, M.D.   CH/MEDQ  D:  03/03/2009  T:  03/04/2009  Job:  [id number]

## 2010-07-27 NOTE — Procedures (Signed)
CAROTID DUPLEX EXAM   INDICATION:  Followup, carotid artery disease.   HISTORY:  Diabetes:  No.  Cardiac:  MI with PTCA in 1995.  Hypertension:  Yes.  Smoking:  No.  Previous Surgery:  No.  CV History:  No.  Amaurosis Fugax No, Paresthesias No, Hemiparesis No                                       RIGHT             LEFT  Brachial systolic pressure:         148               148  Brachial Doppler waveforms:         Normal            Normal  Vertebral direction of flow:        Antegrade         Antegrade  DUPLEX VELOCITIES (cm/sec)  CCA peak systolic                   85                102  ECA peak systolic                   93                105  ICA peak systolic                   52                331  ICA end diastolic                   12                69  PLAQUE MORPHOLOGY:                  Heterogenous      Heterogenous  PLAQUE AMOUNT:                      Mild              Moderate-to-severe  PLAQUE LOCATION:                    ICA/ECA           ICA/ECA/CCA   IMPRESSION:  1. High-end 60-79% stenosis of the left internal carotid artery.  2. 1-39% stenosis of the right internal carotid artery.  3. No significant change from previous examination on 01/16/07.   ___________________________________________  Quita Skye. Hart Rochester, M.D.   CH/MEDQ  D:  07/31/2007  T:  07/31/2007  Job:  [id number]

## 2010-07-27 NOTE — Procedures (Signed)
CAROTID DUPLEX EXAM   INDICATION:  Followup carotid artery disease.   HISTORY:  Diabetes:  No.  Cardiac:  MI with PTCA in 1995.  Hypertension:  Yes.  Smoking:  No.  Previous Surgery:  No.  CV History:  Asymptomatic.  Amaurosis Fugax No, Paresthesias No, Hemiparesis No                                       RIGHT             LEFT  Brachial systolic pressure:         144               146  Brachial Doppler waveforms:         Normal            Normal  Vertebral direction of flow:        Antegrade         Antegrade  DUPLEX VELOCITIES (cm/sec)  CCA peak systolic                   80                92  ECA peak systolic                   88                89  ICA peak systolic                   70                353  ICA end diastolic                   15                84  PLAQUE MORPHOLOGY:                  Mixed             Mixed  PLAQUE AMOUNT:                      Mild              Moderate/severe  PLAQUE LOCATION:                    ICA/ECA           ICA/ECA/CCA   IMPRESSION:  1. 1-39% stenosis of the right internal carotid artery.  2. High end 60-79% stenosis of the left internal carotid artery.  3. No significant change noted when compared to the previous exam on      07/31/2007.   ___________________________________________  Quita Skye. Hart Rochester, M.D.   CH/MEDQ  D:  01/31/2008  T:  01/31/2008  Job:  [id number]

## 2010-07-27 NOTE — Discharge Summary (Signed)
NAME:  Thomas [name], Thomas [name]               ACCOUNT NO.:  [id number]   MEDICAL RECORD NO.:  [id number]          PATIENT TYPE:  INP   LOCATION:  3305                         FACILITY:  [facility]   PHYSICIAN:  Quita Skye. Hart Rochester, M.D.  DATE OF BIRTH:  1916/07/28   DATE OF ADMISSION:  08/15/2008  DATE OF DISCHARGE:  08/16/2008                               DISCHARGE SUMMARY   FINAL DISCHARGE DIAGNOSES:  1. Severe asymptomatic left internal carotid stenosis which is      progressive.  2. Hypertension.  3. Coronary artery disease status post myocardial infarction in 1995      with percutaneous transluminal coronary angioplasty and stenting,      stable for the last 15 years.  4. Hyperlipidemia.   PROCEDURES PERFORMED:  Left carotid endarterectomy with 10-French shunt  and TPA closure with resection of redundant left internal carotid artery  with primary closure by Dr. Hart Rochester on August 15, 2008.   COMPLICATIONS:  None.   CONDITION ON DISCHARGE:  Stable, improving.   DISCHARGE MEDICATIONS:  He is instructed to resume all previous  medications consisting of:  1. Centrum Silver p.o. daily.  2. Diltiazem ER 240 mg p.o. daily.  3. Fexofenadine 100 mg p.o. daily.  4. Plavix 75 mg p.o. daily.  5. Mavik 4 mg p.o. daily.  6. Flonase 0.4 mg inhalation one in each nostril daily.  7. Aspirin 81 mg p.o. daily.  8. Dulcolax p.r.n.  9. Catalin 1 drop both eyes daily at bedtime.  10.He is given a prescription for Percocet 5/325 one p.o. q.6 h.      p.r.n. pain, total #20 were given.   DISPOSITION:  He is being discharged home in stable condition following  careful instructions regarding the care of his wounds and activity  level.  He is to see Dr. Hart Rochester in 2 weeks for followup.  The office  will arrange the visit.   BRIEF IDENTIFYING STATEMENT:  For complete details, please refer the  typed history and physical.  Briefly, this very pleasant 76 year old  gentleman was referred to Dr. Hart Rochester with severe  asymptomatic left  carotid narrowing.  Dr. Hart Rochester evaluated him and recommended left  carotid endarterectomy for stroke prevention.  [name] was informed  of the risks and benefits of the procedure and after careful  consideration he elected to proceed with surgery.   HOSPITAL COURSE:  Preoperative workup was completed as an outpatient.  He was brought in through Same-Day Surgery and underwent the  aforementioned left carotid endarterectomy.  For complete details,  please refer the typed operative report.  The procedure was without  complications.  He was returned to the Postanesthesia Care Unit  extubated.  Following stabilization, he was transferred to a bed in the  Surgical Convalescent Floor.  He was  observed overnight.  He was unable to void.  A Foley was placed.  The  following morning, we were able to remove the Foley with success.  He  was neurologically intact.  He was desirous of discharge and was  subsequently discharged home in stable condition.  Wilmon Arms, PA      Quita Skye Hart Rochester, M.D.  Electronically Signed    KEL/MEDQ  D:  08/17/2008  T:  08/17/2008  Job:  [id number]   cc:   Di Kindle. Edilia Bo, M.D.

## 2010-07-27 NOTE — Assessment & Plan Note (Signed)
[facility]                            CARDIOLOGY OFFICE NOTE   NAME:Thomas [name], Thomas [name]                      MRN:          [id number]  DATE:08/16/2006                            DOB:          06/25/1916    Thomas [name] returns today for management of the following issues:  1. Coronary artery disease.  He is having no angina and really no      anginal equivalencies.  It has been 14 years since his inferior      wall infarct and subsequent interventions.  2. Hypertension.  3. Nonobstructive carotid disease.  Followup ultrasound with Thomas [name] January 10, 2006 showed a 40% - 59% left and a 1% - 39%      right, and showed stability since January 2007.  He is      asymptomatic.  4. Mild aortic stenosis.  We saw this on a 2D echocardiogram after      hearing a murmur in 2004.  He has not had the study repeated.  He      is asymptomatic.  5. Hyperlipidemia with INTOLERANCE TO ALL STATINS.  He still says he      is weak from a statin we stopped a long time ago.   He still trout fishes but he is having more and more problems with his  balance.  He uses a telescoping pole to balance himself in the cree[place].   MEDICINES:  1. Flomax 0.4 mg a day.  2. Aspirin 81 mg a day.  3. Tiazac 240 mg a day.  4. Mavik 4 mg a day.  5. Plavix 75 mg a day.   His blood pressure was 196/96 in the left arm when he first got here,  once he settled down it was 142/90.  Pulse is 84 and regular, then came  down to 60 and regular.  His weight is 167 and stable.  HEENT:  Normocephalic/atraumatic, PERRLA, extraocular movements intact,  sclerae are clear.  Facial symmetry is normal, dentition satisfactory.  NECK:  Supple, carotids are full.  There is bilateral referred sounds  versus bruits.  Thyroid is not enlarged, trachea is midline, there is no  JVD.  LUNGS:  Clear.  HEART:  Reveals a normal S1 with a reduced S2. I can not hear S2 split.  He has a 3/6 systolic murmur  consistent with aortic stenosis, I could  hear no diastolic component.  ABDOMINAL:  Soft with no midline bruit, there is no hepatomegaly.  EXTREMITIES:  Reveal no cyanosis, clubbing, or edema.  Pulses are  intact.  NEURO:  Exam is intact.   EKG shows sinus rhythm with old inferior wall infarct, poor R wave  progression across the anterior precordium.  Compared to previous ECGs  this has not changed.   ASSESSMENT/PLAN:  Thomas [name] continues to do remarkably well despite  advancing age.  It amazes me he still trout fishes.  I am concerned  about his aortic stenosis being significantly  worse and it may change our medical treatment.  I have scheduled him for  a 2D echo.  Assuming this is stable and not more than moderate, I will  see him back in a year.     Thomas C. Daleen Squibb, MD, [facility]  Electronically Signed    TCW/MedQ  DD: 08/16/2006  DT: 08/16/2006  Job #: [id number]   cc:   Thomas Crane, MD

## 2010-07-27 NOTE — Assessment & Plan Note (Signed)
[facility]                            CARDIOLOGY OFFICE NOTE   NAME:Thomas [name], Thomas [name]                      MRN:          [id number]  DATE:08/23/2007                            DOB:          26-Aug-1916    Thomas [name] returns today for followup.  He continues to do remarkably  well at 89!   He does notice some numbness in both feet when he sits for a long time  reading the paper.  He has to be careful when he gets up because he is a  little bit imbalanced.   PROBLEM LIST:  1. Coronary artery disease.  He is having no anginal or any anginal      equivalents.  He is 15 years out from his inferior wall infarct and      interventions.  2. Hypertension.  3. Nonobstructive carotid disease, followup ultrasound by Dr. Quita Skye.      Hart Rochester, on Jul 31, 2007, showed nonobstructive disease in the right      internal carotid artery.  He had 60-79% stenosis in the left      internal carotid artery.  There was no significant change from his      exam on January 16, 2007.  He is asymptomatic.  4. Mild aortic stenosis.  A 2-D echocardiogram on September 05, 2006,      showed ejection fraction of 60%, mild left ventricular hypertrophy,      mild aortic stenosis with a mean gradient of 17, valve area was      around 1 cm square.  He had mild mitral regurgitation and mild left      atrial enlargement.  5. Hyperlipidemia with intolerance to all statins.   CURRENT MEDICATIONS:  1. Flomax 0.4 mg a day.  2. Aspirin 81 mg a day.  3. Mavik 4 mg a day.  4. Plavix 75 mg a day.  5. Diltiazem extended release 240 mg a day.   PHYSICAL EXAMINATION:  VITAL SIGNS:  His blood pressure today is 148/82,  his pulse 63 and regular.  He is in sinus rhythm.  He has an old  inferior wall infarct and poor progression across the anterior  precordium, which is old.  There are no new changes.  His weight is 167.  HEENT:  Unchanged.  Carotids are full with bilateral bruits.  Thyroid is  not  enlarged.  Trachea is midline.  LUNGS:  Clear to auscultation.  HEART:  A nondisplaced PMI, he has an S4.  There is an aortic stenosis  murmur, grade 2-3/6.  It was difficult to hear S2 split.  ABDOMEN:  Soft, good bowel sounds.  His aortic murmur emanates into his  epigastric area.  He has no pulsatile mass.  There is no obvious  organomegaly.  Bowel sounds are present.  EXTREMITIES:  No edema.  Pulses are present bilaterally, 2+/4+.  He has  varicose veins.  There is no sign of DVT.  NEUROLOGIC:  Grossly intact.  He has a steady careful gait.   ASSESSMENT/PLAN:  Thomas [name] continues to do remarkably well at 80.  I  have advised him to be careful getting up after he has been sitting for  a prolonged period of time.  I have made no changes in his medical  program.  We will plan on seeing him back again in a year.     Thomas C. Daleen Squibb, MD, [facility]  Electronically Signed    TCW/MedQ  DD: 08/23/2007  DT: 08/24/2007  Job #: [id number]

## 2010-07-27 NOTE — Op Note (Signed)
NAME:  Thomas [name], Thomas [name]               ACCOUNT NO.:  [id number]   MEDICAL RECORD NO.:  [id number]          PATIENT TYPE:  INP   LOCATION:  3305                         FACILITY:  [facility]   PHYSICIAN:  Quita Skye. Hart Rochester, M.D.  DATE OF BIRTH:  Apr 17, 1916   DATE OF PROCEDURE:  08/15/2008  DATE OF DISCHARGE:                               OPERATIVE REPORT   PREOPERATIVE DIAGNOSIS:  Severe left internal carotid stenosis -  asymptomatic.   POSTOPERATIVE DIAGNOSIS:  Severe left internal carotid stenosis -  asymptomatic.   OPERATION:  Left carotid endarterectomy with Dacron patch angioplasty  plus resection of redundant internal carotid artery with primary  reanastomosis.   SURGEON:  Quita Skye. Hart Rochester, MD   FIRST ASSISTANT:  Wilmon Arms, PA   ANESTHESIA:  General endotracheal.   BRIEF HISTORY:  This 77 year old gentleman has been known to have some  moderate occlusive disease in his left carotid artery with duplex scan  at this time reveals severe progression of disease to greater than 90%  in severity on the left.  He had minimal disease in his right internal  carotid.  He was scheduled for a left carotid endarterectomy for this  asymptomatic lesion.   PROCEDURE:  The patient was taken to the operating room and placed in a  supine position at which time satisfactory general endotracheal  anesthesia was administered.  Left neck was prepped with Betadine scrub  and solution and draped in routine sterile manner.  An incision was made  along the anterior border of sternocleidomastoid muscle and carried down  through subcutaneous tissue and platysma using Bovie.  Common facial  vein and external jugular veins were ligated with 3-0 silk ties,  divided, exposing the common, internal, and external carotid arteries.  Care was taken not to injure the vagus or hypoglossal nerves, which were  exposed.  There was a calcified plaque at the carotid bifurcation  extending up in the internal carotid  artery about 3-4 cm.  Distal vessel  had a severe kink at the level the hypoglossal nerve almost 90 degrees  in angulation and this was clearly require resection to avoid a postop  kink.  After mobilizing the vessels, #10 shunt was prepared and the  patient was heparinized.  Carotid vessels were occluded with vascular  clamps.  A longitudinal opening made in the common carotid with 15 blade  and extended up in the internal carotid with Potts scissors to a point  distal to the disease.  A #10 shunt was inserted without difficulty  reestablishing flow in about 2 minutes.  The plaque was indeed about 90-  95% stenotic in severity.  With this severe kink just distal to that,  the distal vessel appeared normal.  A standard endarterectomy was then  performed using elevator and Potts scissors with eversion endarterectomy  of the external carotid.  The plaque feathered off distal internal  carotid artery nicely, not requiring any tacking sutures.  The lumen was  thoroughly irrigated with heparin and saline.  All loose debris  carefully removed and using 6-0 Prolene marking sutures, a segment of  internal carotid artery was resected about 1.5 to 2 cm in length with  primary reanastomosis done with 6-0 Prolene.  Following completion of  this, a Dacron patch was sewn into place.  The shunt was then removed  and following antegrade and retrograde flushing, closure was completed  reestablishing flow initially up the external and internal branch.  Carotid was occluded for less than 2 minutes for removal of shunt.  Protamine was  then given to reverse the heparin.  Following adequate hemostasis, wound  was irrigated with saline, closed in layers with Vicryl in subcuticular  fashion.  Sterile dressing applied.  The kink was completely relieved  after resecting the internal and there was excellent Doppler flow.  The  patient was taken to recovery room in satisfactory condition.      Quita Skye Hart Rochester,  M.D.  Electronically Signed     JDL/MEDQ  D:  08/15/2008  T:  08/16/2008  Job:  [id number]

## 2010-07-27 NOTE — Assessment & Plan Note (Signed)
OFFICE VISIT   Thomas [name], Thomas [name]  DOB:  1916-10-28                                       08/26/2008  [id number]#:[id number]   The patient is a 77 year old who underwent a left carotid endarterectomy  plus resection of a redundant internal carotid artery primary  anastomosis by me on June 4 for asymptomatic severe stenosis.  He has  mild disease on the contralateral right side.  He has done very well  since his surgery with a very mild left marginal mandibular nerve  paresis which he states is already rapidly improving.  He is swallowing  well and has had no neurologic symptoms otherwise.  He has resumed his  aspirin and Plavix.  His only complaint is regarding his postoperative  stay with a long interval before getting a Foley catheter because of  inability to void.  He has been voiding well since his discharge from  the hospital.   PHYSICAL EXAMINATION:  On exam today blood pressure 133/78, heart rate  78, respirations 18.  His left neck incision is healing nicely.  Neurologic exam is normal with the exception of a mild left marginal  mandibular nerve paresis.   In general I think he is doing well.  I will see him back in 6 months  with a followup carotid duplex exam at that time unless he develops any  neurologic symptoms in the interim.   Quita Skye Hart Rochester, M.D.  Electronically Signed   JDL/MEDQ  D:  08/26/2008  T:  08/27/2008  Job:  [id number]

## 2010-07-27 NOTE — Procedures (Signed)
CAROTID DUPLEX EXAM   INDICATION:  Followup evaluation of known carotid artery disease.   HISTORY:  Diabetes:  No.  Cardiac:  History of MI and coronary angioplasty in 1995.  Hypertension:  Yes.  Smoking:  No.  Previous Surgery:  No.  CV History:  Previous duplex on 01/31/2008 revealed a 20-39% right ICA  stenosis and a 60-79% left ICA stenosis.  The patient has multiple  episodes of left eye visual disturbances which he attributes to  glaucoma.  Amaurosis Fugax No, Paresthesias No, Hemiparesis No                                       RIGHT             LEFT  Brachial systolic pressure:         156               160  Brachial Doppler waveforms:         Triphasic         Triphasic  Vertebral direction of flow:        Antegrade         Antegrade  DUPLEX VELOCITIES (cm/sec)  CCA peak systolic                   57                62  ECA peak systolic                   107               92  ICA peak systolic                   63                534  ICA end diastolic                   21                188  PLAQUE MORPHOLOGY:                  Mixed             Mixed  PLAQUE AMOUNT:                      Mild to moderate  Severe  PLAQUE LOCATION:                    Proximal ICA      Proximal ICA   IMPRESSION:  1. 20-39% right ICA stenosis.  2. 80-99% left ICA stenosis.  3. Followup appointment was scheduled with Dr. Hart Rochester.   ___________________________________________  Thomas [name]. Hart Rochester, M.D.   MC/MEDQ  D:  08/08/2008  T:  08/08/2008  Job:  [id number]

## 2010-07-30 NOTE — Op Note (Signed)
NAME:  Thomas [name], Thomas [name]               ACCOUNT NO.:  [id number]   MEDICAL RECORD NO.:  [id number]          PATIENT TYPE:  AMB   LOCATION:  ENDO                         FACILITY:  [facility]   PHYSICIAN:  John C. Madilyn Fireman, M.D.    DATE OF BIRTH:  07/07/1916   DATE OF PROCEDURE:  05/31/2004  DATE OF DISCHARGE:                                 OPERATIVE REPORT   INDICATIONS FOR PROCEDURE:  History of adenomatous colon polyps.   PROCEDURE:  The patient was placed in the left lateral decubitus position  and placed on the pulse monitor with continuous low-flow oxygen delivered by  nasal cannula. He was sedated with 62.5 mcg IV fentanyl 7 mg IV Versed. The  Olympus video colonoscope was inserted into the rectum and advanced to the  cecum, confirmed by transillumination of McBurney's point and visualization  of ileocecal valve and appendiceal orifice. Prep was good. The cecum,  ascending, transverse, descending colon all appeared normal with no masses,  polyps, diverticula or other mucosal abnormalities. In the sigmoid colon,  there was seen a few scattered diverticula and no other abnormalities. The  rectum appeared normal. Retroflexed view of the anus revealed no obvious  internal hemorrhoids. Scope was then withdrawn and the patient returned to  the recovery room in stable condition. He tolerated the procedure well and  there were no immediate complications.   IMPRESSION:  Diverticulosis, otherwise normal study.   PLAN:  Repeat colonoscopy in 5 years based on his prior history of polyps.      JCH/MEDQ  D:  05/31/2004  T:  05/31/2004  Job:  [id number]   cc:   Dr. Trenton Gammon

## 2010-07-30 NOTE — Discharge Summary (Signed)
[facility]  Patient:    JAMERSON, VONBARGEN                      MRN: [id number] Adm. Date:  [id number] Disc. Date: [id number] Attending:  Herold Harms Dictator:   Arnoldo Morale, P.A. CC:         Feliciana Rossetti, M.D.   Discharge Summary  ADMITTING DIAGNOSES: 1. End-stage osteoarthritis, left knee. 2. Hypertension. 3. Coronary artery disease. 4. Bladder cancer.  DISCHARGE DIAGNOSES: 1. End-stage osteoarthritis, left knee. 2. Hypertension. 3. Coronary artery disease. 4. Bladder cancer. 5. Post hemorrhagic anemia.  SURGICAL PROCEDURE:  On December 30, 1999, Mr. Wickens underwent a left total knee arthroplasty by Dr. Claude Manges. [place].  COMPLICATIONS:  None.  CONSULTS: 1. Case management, physical therapy, rehab medicine and pharmacy consult    December 31, 1999. 2. Occupational therapy consult January 01, 2000.  HISTORY OF PRESENT ILLNESS:  This 77 year old white male presented to Dr. Cleophas Dunker with a 15-20 year history of progressively worsening left knee pain. The pain has gotten significantly worse over the last year and is described as a sharp pain which increases with activity and is located across the anterior aspect of the joint. No radiation. There is some weakness in his legs and he has difficulty doing activities that he enjoys because of the pain. He has had no improvement with conservative measures and because of this he is presenting for a left total knee arthroplasty.  HOSPITAL COURSE:  Mr. Joplin tolerated his surgical procedure well without immediate postoperative complications. He was subsequently transferred to [facility] [place]. On postoperative day 1, he was afebrile, vital signs stable. Leg was neurovascularly intact and dressing was intact. Hemoglobin 10.4, hematocrit 29.4. He was started on PT per protocol and his labs were monitored.  On postoperative day remained afebrile. Left knee incision was well approximated with staples.  Minimal drainage. Laceration to the distal leg that he had preoperatively was clean without signs of infection. His white count was 7.7, hemoglobin 10, hematocrit 28.3 and he was continued on current therapy.  On postoperative day 3, Tmax was 101. Vital signs were stable. Left knee incision unchanged.  His hemoglobin was 9.5 with a hematocrit of 26.6 and he was subsequently transfused with 2 units of autologous blood.  On postoperative day 4, his Tmax was 101.8, vital signs remained stable. No complaints of dysuria. A left knee incision was unchanged. An incision to the left lower leg was intact with sutures and no drainage. Clean catch UA and C&S was obtained. His temperature was to be monitored and a rehab bed became available that day so he was transferred to rehab later in the day.  DISCHARGE INSTRUCTIONS: 1. He is to continue his current hospitalization medications with the    adjustments per rehab physicians. 2. Depending upon the clean catch UA results, he may require antibiotics for    a urinary tract infection and rehab will have to make the decision on that. 3. He is to be partial weightbearing 50% or less on the left leg with the    use of the walker and is to continue PT and OT per rehab protocol. 4. He is to keep his left knee incision clean and dry and to monitor for    signs of infection. He needs to have staples discontinued on about    postoperative day 14. If he is still in rehab, they can be done there or    he  needs to follow-up with Dr. Cleophas Dunker at that time and will remove the    staples, get pictures and apply Steri-Strips with Benzoin.  LABORATORY DATA:  On December 24, 1999, white count 7.4, hemoglobin 11.9, hematocrit 33.7, platelets 221. On October 19, hemoglobin 10.4, hematocrit 29.4. On October 20, hemoglobin 10, hematocrit 28.3. On October 21, white count 8, hemoglobin 9.5, hematocrit 26.6 and platelet count 177.  On October 12, PT was 14.5 seconds with  an INR of 1.3 and a PTT of 27. On October 22, PT was 21.6 seconds with an INR of 2.5.  On December 31, 1999, glucose was 130 and calcium 8. On October 20, glucose 120, calcium 7.9. On October 21, his sodium was 143, potassium 3.7, chloride 110, CO2 27, glucose 108, BUN 15, creatinine 1.4, calcium 8.  Urinalysis on October 11 was within normal limits. Urine culture done on October 22 showed 28,000 colonies per ml of Enterobacter cloacae which was resistant to amp, amoxicillin, cefazolin, ______ only. DD:  01/24/00 TD:  01/24/00 Job: [id number]

## 2010-07-30 NOTE — Cardiovascular Report (Signed)
NAME:  Thomas [name], Thomas [name]               ACCOUNT NO.:  [id number]   MEDICAL RECORD NO.:  [id number]          PATIENT TYPE:  INP   LOCATION:  2025                         FACILITY:  [facility]   PHYSICIAN:  Carole Binning, M.D. LHCDATE OF BIRTH:  12/24/1916   DATE OF PROCEDURE:  02/04/2004  DATE OF DISCHARGE:                              CARDIAC CATHETERIZATION   PROCEDURE PERFORMED:  Left heart catheterization with coronary angiography  and left ventriculography.   INDICATION:  [name] is an 77 year old male who was admitted with chest  pain.  He has had recurrent episodes of chest pain in the hospital.  He does  have a history of coronary artery disease.  He is thus referred for cardiac  catheterization.  He does have mild chronic renal insufficiency and was thus  treated with sodium bicarbonate per protocol.   CATHETERIZATION PROCEDURAL NOTE:  A 6 French sheath was placed in the right  femoral artery.  Coronary angiography was performed with standard Judkins 6  French catheters.  Left ventriculography was performed with an angled  pigtail catheter.  Contrast was Visipaque.  There were no complications.   CATHETERIZATION RESULTS:   HEMODYNAMICS:  1.  Left ventricular pressure 156/10.  2.  Aortic pressure 148/75.  3.  There is a less than 10 mm gradient across the aortic valve on catheter      pullback.   LEFT VENTRICULOGRAM:  Wall motion is normal.  Ejection fraction estimated at  greater than or equal to 65%.  There is no significant mitral regurgitation.   CORONARY ARTERIOGRAPHY:  Left main has a distal 60% stenosis.   Left anterior descending artery has minor luminal irregularities in the mid  vessel.  The LAD is otherwise normal giving rise to three small diagonal  branches.   Left circumflex has a 60-70% stenosis at its ostium.  In the proximal to mid  vessel, there is a tubular 60-70% stenosis.  The circumflex gives rise to  normal to large size first obtuse marginal,  normal size second obtuse  marginal branch.  In the mid portion of the first obtuse marginal branch  there is a 70% stenosis.   Right coronary artery has a 30% stenosis in the proximal vessel, 30%  stenosis in the mid vessel.  The distal right coronary artery gives rise to  a normal size posterior descending artery and a normal size posterior  lateral branch.  The posterior descending artery has a 30% stenosis at its  ostium and a 30% stenosis in the mid segment.  The posterior lateral branch  also has a 30% stenosis in the mid vessel.   IMPRESSION:  1.  Normal left ventricular systolic function.  2.  Moderate two-vessel coronary artery disease involving the distal left      main and the left circumflex and obtuse marginal.  This disease is of      borderline severity.   RECOMMENDATIONS:  Medical therapy.  If the patient has recurrent symptoms of  chest pain which are felt to be possibly cardiac in etiology, would  recommend a stress nuclear scan to better  localize ischemia.       MWP/MEDQ  D:  02/04/2004  T:  02/04/2004  Job:  [id number]   cc:   Jeannett Senior A. Clent Ridges, M.D. [facility]   Maisie Fus C. Wall, M.D.

## 2010-07-30 NOTE — Discharge Summary (Signed)
NAME:  Thomas [name], Thomas [name]               ACCOUNT NO.:  [id number]   MEDICAL RECORD NO.:  [id number]          PATIENT TYPE:  INP   LOCATION:                               FACILITY:  [facility]   PHYSICIAN:  Jesse Sans. Wall, M.D.   DATE OF BIRTH:  Mar 10, 1917   DATE OF ADMISSION:  02/02/2004  DATE OF DISCHARGE:  02/04/2004                           DISCHARGE SUMMARY - REFERRING   PROCEDURES:  Cardiac catheterization November 23.   REASON FOR ADMISSION:  [name] is an 77 year old male with known coronary  artery disease followed by Dr. Valera Castle who presented to the emergency  room with atypical chest pain.  Please refer to dictated admission note for  full details.   LABORATORY DATA:  Normal serial cardiac markers.  BUN 24, creatinine 1.6 on  admission, 16/1.5, respectively pre discharge.  Normal liver enzymes with  decreased albumin 2.8.  TSH 3.71.  Lipid profile:  Total cholesterol 189,  triglycerides 263, HDL 42, LDL 53.  Hemoglobin 12.6, hematocrit 37 on  admission.   Admission chest x-ray:  Mild COPD, no active disease.   HOSPITAL COURSE:  Following admission patient ruled out for myocardial  infarction with all serial cardiac markers within normal limits.  However,  given his recurrent chest pain and reported relief with nitroglycerin,  recommendation by Dr. Daleen Squibb was to proceed with diagnostic cardiac  catheterization.   Patient was maintained on intravenous heparin in addition to all previous  home medications.   2-D echocardiogram revealed normal LV function (55-65%), mild aortic  stenosis (mean gradient 10.2 mmHg), ABA 1.49 sq cm.   Patient underwent cardiac catheterization on hospital day #2 by Dr. Loraine Leriche  Pulsipher (see report for full details) revealing moderate two vessel  coronary artery disease with 60% distal left main stenosis.  Left  ventricular function was normal with no evidence of wall motion abnormality.   Dr. Gerri Spore recommended medical therapy and, if patient  were to have  recurrent chest discomfort, consider follow-up perfusion study to localize  the ischemic territory.   Patient expressed strong desire to be discharged the evening following the  procedure to be home for the Thanksgiving holiday.  His right groin was  stable per examination by Dr. Gerri Spore.   Patient did request a prescription for Nitro-Dur patch given his report  relief to transdermal nitroglycerin while in the hospital.  He will be  discharged following completion of his post catheterization sodium  bicarbonate infusion.   DISCHARGE DIAGNOSES:  1.  Recurrent chest pain/known coronary artery disease.      1.  Normal serial cardiac markers.      2.  Moderate two vessel coronary artery disease/60% distal left main          coronary artery disease by cardiac catheterization November 23.      3.  Normal left ventricular function.      4.  Status post myocardial infarction/two vessel percutaneous          intervention 1994.  2.  Dyslipidemia.      1.  Statin intolerance.  3.  Hypertension.  4.  Chronic renal insufficiency.  5.  Mild aortic stenosis.      Gene   GS/MEDQ  D:  02/04/2004  T:  02/04/2004  Job:  [id number]   cc:   Jeannett Senior A. Clent Ridges, M.D. [facility]

## 2010-07-30 NOTE — H&P (Signed)
[facility]  Patient:    Thomas [name], Thomas [name]                  MRN: [id number] Adm. Date:  12/30/99 Attending:  Claude Manges. Cleophas Dunker, M.D. Dictator:   Jamelle Rushing, P.A.                         History and Physical  DATE OF BIRTH:  07/29/1916  CHIEF COMPLAINT:  Left knee pain for the last 15 to 20 years.  HISTORY OF PRESENT OF ILLNESS:  The patient is an 77 year old white male with an approximately 52 to 20-year progressively worsening left knee pain. The patient states that he likes to trout fish and play golf; but due to his progressively worsening left knee pain, he has had to curtail these activities over the last year. The pain has significantly worsened over the last one year. The patient does describe the pain as a sharp pain which is worsened with activity but is located primarily across the anterior aspect of the joint. It does not radiate up into the thigh or calf. He also has an associated weakness with this pain. The patient has had two rounds of Hyalgan. He about a 1-year improvement after the first round but no improvement after the second round. He does have mechanical symptoms, such as locking up, but he denies any grinding or swelling or popping in the knee at the present time. X-rays in the past have revealed bone-on-bone medial compartment.  CURRENT MEDICATIONS: 1. Cardura 2 mg p.o. q.p.m. 2. Mavik 4 mg p.o. q.a.m. 3. Allegra 180 mg p.o. q.p.m. 4. Vioxx 12.5 mg p.o. q.p.m. 5. Lipitor 10 mg p.o. q.p.m. 6. Atenolol 50 mg p.o. q.p.m.  ALLERGIES:  SULFA.  MEDICAL HISTORY:  The patient does have a significant history of hypertension. The patient is being evaluated by Jesse Sans. Wall, M.D. for his current problem and has been seen within the last 1 or 2 weeks for adjustment of his medications. The patient did have a ______ of a worn out tired feeling and Thomas C. Wall, M.D. felt this was probably related to the medication he  has been adjusting it over the last week. The patient also has a significant cardiac history. He has had two coronary artery blockages in 1994 for which stents were placed. The patient has not had any problems since, and he has had an evaluation approximately 2 weeks ago.  The patient denies any history of diabetes, thyroid disease, hiatal hernia, peptic ulcers, asthma.  PAST SURGICAL HISTORY:  The patient had an appendectomy in 1932, bladder cancer in 1978, angioplasty in 1994. Gallbladder removal in 1994. Knee cartilage surgery in 1970. Elbow surgery in 1968. The patient states that he did have a significant Staph infection related to his gallbladder surgery for which he remained on IV antibiotics for approximately 6 months. The patient was found to have an abscess on his spine which took the 6 months to resolve.  SOCIAL HISTORY:  The patient is an 77 year old white male who denies any smoking. The patient does admit to about a 2 ounce drink a day. He is currently married. He does have two grown children, lives with his wife in a [place] house. The patient is a retired Psychologist, forensic for [employer].  FAMILY PHYSICIAN:  Feliciana Rossetti, M.D. at [phone]. Cardiologist is Jesse Sans. Wall, M.D.  FAMILY MEDICAL HISTORY:  Mother is deceased at the  age of 10 from spleen cancer. Father is deceased of natural causes at 58. The patient has got one brother deceased from suicide and one sister deceased about 1 month after birth. One brother and sister are alive. The brother has got a significant cardiac history with a valve replacement and defibrillator. Sister is in fairly good condition.  REVIEW OF SYSTEMS:  Positive for a chronic cough over this last summer. It is related to sinus drainage and his allergies. He denies any fevers or chills or any other symptoms. The patient does have upper and lower dentures. The patient does use glasses all the time. The patient does have some  tiredness with exercise for which it is being evaluated by Maisie Fus C. Wall, M.D. and having his blood pressure medications adjusted. The patient also has a slightly related shortness of breath with this tiredness. The patient denies any chest discomfort, any diaphoresis, any irregular heart beats. His rest of review of systems is negative for any sensory, general, respiratory, GI, GU, hematologic, musculoskeletal, neurologic, mental status problems at this time.  PHYSICAL EXAMINATION:  VITAL SIGNS:  Blood pressure is 148/60, pulse of 64, respirations 12, temperature is 97.8, height is 5 feet 10 inches, weight is 170 pounds.  GENERAL:  This is a rather healthy-appearing, elderly gentleman who appeared stated age. He looks like he is able to ambulate without any difficulty or any significant limp at the current time. He is able to get on and off the exam table without much obvious discomfort.  HEENT:  Head is normocephalic, atraumatic, and nontender over maxillary and frontal sinuses. External ears were without deformities. Canals patent. TMs pearly gray and intact. Gross hearing is intact. Pupils are equal, round, and reactive, accommodating to light. Extraocular movements are intact. Sclerae is nonicteric. Conjunctivae is pink and moist. Nasal septum is midline. Mucous membranes pink and moist. No polyps noted. Oral buccal mucosa was pink and moist without lesions. Upper and lower dentures were in place. Uvula was midline and moved symmetrically with phonation. The patient was able to swallow without any difficulty or soreness.  NECK:  Supple. The patient had excellent range of motion of the cervical spine without any difficulty or tenderness. No palpable lymphadenopathy. The thyroid gland was nontender.  CHESTS:  Lungs sounds were clear and equal bilaterally. No wheezes, rhonchi, rales, or rubs noted.  HEART:  Regular rate and rhythm of S1 and S2 were auscultated. No  murmurs, rubs, or gallops.  ABDOMEN:  Round, soft, nontender. Bowel sounds were present throughout and  were normoactive. No hepatosplenomegaly palpable. CVA was nontender to percussion.  EXTREMITIES:  Upper extremities were symmetrically sized and shaped with excellent range of motion of his shoulders, elbows, and wrists bilaterally. The patient had 5/5 motor strength in all muscle groups bilaterally.  Lower extremities:  The patient had excellent range of motion of right and left hips with internal and external rotation and flexion and extension without any difficulty or tenderness. Right and left knees were symmetrically sized and shaped. No sign of erythema or ecchymoses. The patient had no palpable effusions. The patient had very fine crepitus under the bilateral patellae with excellent range of motion which was 0 to about 110 degrees. Left knee was nontender along the medial and lateral joint-line, as was the right knee. The patient had no valgus varus laxity. No anterior or posterior draw. The patient did have rather prominent varus deformities just inferior to the knee of the proximal tibia. Right and left ankles were  symmetrically sized and shaped with good dorsi and plantar flexion.  PERIPHERAL VASCULATURE:  Carotid pulses were 2+, no bruits. Radial pulses were 2+, femoral pulses were 2+. Dorsalis pedis and posterior tibial pulses were 2+. No lower extremity edema or venous stasis changes noted.  SKIN:  Warm and dry and intact.  NEUROLOGICAL:  The patient was conscious, alert, and appropriate. Held an easy conversation with the examiner. Cranial nerves 2 through 12 are grossly intact. Deep tendon reflexes of the upper and lower extremities were grossly intact and symmetrical.  BREASTS/RECTAL/GENITOURINARY:  Deferred.  IMPRESSION: 1. End-stage osteoarthritis left knee. 2. Hypertension. 3. Coronary artery disease.  PLAN:  The patient will be admitted to [facility] under the care of Claude Manges. Cleophas Dunker, M.D. on October 18 for a planned left total knee arthroplasty. The patient has donated 2 units of autologous blood. The patient will undergo all routine labs and tests prior to this upcoming surgical procedure. The patient is also being evaluated by Maisie Fus C. Wall, M.D. for adjustment of his blood pressure medications, and he is expected to contact Thomas C. Wall, M.D. within the next 1 or 2 days. DD:  12/27/99 TD:  12/27/99 Job: [id number]

## 2010-07-30 NOTE — Op Note (Signed)
[facility]  Patient:    Thomas [name], Thomas [name]                     MRN: [id number] Proc. Date: 12/30/99 Attending:  Claude Manges. Cleophas Dunker, M.D.                           Operative Report  PREOPERATIVE DIAGNOSIS:  End-stage osteoarthritis of left knee.  POSTOPERATIVE DIAGNOSIS:  End-stage osteoarthritis of left knee.  PROCEDURE:  Left total knee replacement.  SURGEON:  Claude Manges. Cleophas Dunker, M.D.  ASSISTANT:  Arnoldo Morale, P.A.-C.  ANESTHESIA:  Spinal.  COMPLICATIONS:  None.  COMPONENTS:  Depuy LCS standard plus femoral component, standard plus large rotating tibial platform with a 10 mm bridging bearing, and a cruciate metal back patellar component, all were secured with polymethyl methacrylate.  DESCRIPTION OF PROCEDURE:  With the patient comfortable on the operating table and under spinal anesthetic, the left lower extremity was placed in a thigh tourniquet.  The leg was then prepped with Betadine scrub and then DuraPrep, and the tourniquet to the ankle, and sterile draping was performed.  With the extremity elevated with Esmarch exsanguinated with the proximal tourniquet at 350 mmHg.  A midline longitudinal incision was made centered over the patella, extending from the superior pouch to the tibial tubercle where a sharp dissection was carried out to subcutaneous tissue.  The first layer of capsule was incised in the midline, and the medial parapatellar incision was made to the deep capsule.  There was a small, i.e. probably 10 cc or 15 cc of clear yellow joint effusion.  The patella was everted 180 degrees and the knee flexed to 90 degrees.  The joint was inspected with large osteophytes, and along the medial and lateral femoral condyle.  There was absolutely no cartilage remaining on the medial femoral condyle with a large area of eburnated bone and loss of articular cartilage on the medial tibial surface as well.  There was still  cartilage remaining on the lateral femoral condyle and tibial plateau, but considerably thin.  Preoperatively, we had measured a standard plus femoral component, and this was confirmed intraoperatively.  We also confirmed a large rotating tibial platform.  The appropriate femoral tibial jigs were applied to make the appropriate femoral tibial bone cuts.  A 4 degree distal valgus cut was utilized.  We sacrificed the ACL and PCL, and maintained the MCL and LCL.  A 10 mm flexion extension gap were perfectly stable.  Laminar spreaders were inserted to remove the remnants of medial and lateral menisci, as well as the remnants of the posterior portions of the ACL and the PCL.  The trial components were then inserted to its full range of motion, and there was no malrotation of the tibial component.  There was slight hyperextension and no opening with varus or valgus stress.  The patella was repaired by removing 11 mm of bone.  The cruciate jig was applied and the bone used to make the appropriate cut in the patella.  The patella was applied.  It fit very nicely and snug, and placed through a full range of motion with no malrotation or subluxation.  The trial components were removed.  The joint was then copiously irrigated with the jet saline lavage and antibiotic solution.  Each of the components were then secured with polymethyl methacrylate and 1 g of vancomycin.  The patient has had previous surgeries with Staph infection,  and vancomycin was used prophylactically.  The extraneous methacrylate was removed with the [facility].  The knee was placed in full extension to compress the components.  The patella was applied with patellar clamp and methacrylate.  Again, extraneous methacrylate was removed. After hardening of the methacrylate, the joint was inspected.  There are a few hardened extraneous methacrylate and this was removed with an osteotome.  The knee was placed through a full range of  motion.  There was a slight hyperextension and I could flex the knee perhaps 125-130 degrees.  There was no malrotation of the tibial component.  There was no opening with the varus or valgus stress.  The tourniquet was deflated.  Gross bleeders were Bovie coagulated.  The joint was quite dry at that point.  It was again irrigated with jet saline lavage and antibiotic solution.  The deep capsule was closed with interrupted #1 Tycron, and the superficial capsule closed with a running 0 Vicryl, and the subcutaneous with 2-0 Vicryl, and the skin closed with skin clips; 0.25% Marcaine with epinephrine was injected in the wound edges and joint space.  A sterile bulky dressing was applied followed by a knee immobilizer.  The patient tolerated the procedure without complication with good capillary refill and pulses to the foot. DD:  12/30/99 TD:  12/30/99 Job: [id number]

## 2010-07-30 NOTE — H&P (Signed)
NAME:  Thomas [name], Thomas [name] NO.:  [id number]   MEDICAL RECORD NO.:  [id number]          PATIENT TYPE:  EMS   LOCATION:  MAJO                         FACILITY:  [facility]   PHYSICIAN:  Verne Grain, MD   DATE OF BIRTH:  1916-06-15   DATE OF ADMISSION:  02/01/2004  DATE OF DISCHARGE:                                HISTORY & PHYSICAL   PRIMARY CARDIOLOGIST:  Dr. Daleen Squibb   PRIMARY CARE PHYSICIAN:  Dr. Clent Ridges at [facility].   CHIEF COMPLAINT:  Atypical chest pain.   HISTORY OF PRESENT ILLNESS:  This is an 77 year old male with coronary  artery disease, history of myocardial infarction and 2-vessel PCI in 1994,  hypertension, hyperlipidemia, chronic weakness/poor exercise tolerance. No  longer playing golf secondary to vertigo although he does work out at an  exercise facility on a semi-regular basis, taken off statins in the past for  complaints of,  weakness.  Taken off Atenolol with change to Diltiazem for unclear  reasons. Reports to the ER complaining of chest and back burning present  since January of this year. The discomfort is most prominent with recumbency  either lying supine or sitting up and leaning back. The pain apparently  improves with position. The patient reports no alteration in symptoms with  activity.   Tonight the patient noted that the discomfort/burning was particularly bad  and involved the chest with discomfort that was reminiscent of his  myocardial infarction in 1994.  For this reason he reported to the emergency  room for further evaluation. The pain this evening did not seem to improve  as much with position, however did gradually decrease in severity while in  the emergency room receiving standard therapy with aspirin and  nitroglycerin.  Currently the patient is comfortable, said he would like to  sleep, says that the burning is not completely gone but is almost gone and  he reports that he is comfortable.  The patient  reportedly has an  appointment to see Dr. Daleen Squibb on April 09, 2004 as this was regularly  scheduled several months ago.  He does report a stress-test per his  recollection, that as done with a pharmacologic agent 2 to 3 years ago that  he believes was normal.  He reports no cardiac catheterization since his PCI  in 1994.   ALLERGIES/ADVERSE REACTIONS:  1.  SULFA WHICH CAUSES A RASH.  2.  STATINS WHICH REPORTEDLY CAUSE WEAKNESS.  3.  ATENOLOL WHICH WAS CHANGED TO DILTIAZEM FOR UNCLEAR REASONS THAT THE      PATIENT CANNOT RECALL.   CURRENT MEDICATIONS:  1.  Hydrochlorothiazide 12.5 mg p.o. daily.  2.  Meclozine 25 mg p.o. q.6h p.r.n.  3.  Doxazosin 4 mg p.o. q.h.s.  4.  Acyclovir 200 mg p.r.n.  5.  Tiazac 240 mg p.o. daily.  6.  Zetia 10 mg p.o. daily.  7.  Plavix 75 mg p.o. daily.  8.  Mavik 4 mg p.o. daily.   PAST MEDICAL HISTORY:  1.  Coronary artery disease with history of myocardial infarction and 2-  vessel PCI in 1994 (performed at [facility]) per patient's      report.  2.  Chronic renal insufficiency with baseline creatinine of 1.5.  3.  Hypertension.  4.  Hyperlipidemia.  5.  Status post last total knee replacement.  6.  Benign prostatic hypertrophy.  7.  History of appendectomy; cholecystectomy.  8.  Remote history of bladder cancer (1978).  9.  Chronic weakness/poor exercise tolerance/fatigue.  10. History of statin associated weakness.  11. History of change from Atenolol to Diltiazem for unclear reasons.  12. History of cough/environmental allergies.  13. History of vertigo for which the patient reports that he has had to stop      playing golf and report taking meclizine p.r.n. with some improvement.   SOCIAL HISTORY:  The patient is a World War II veteran. He reports smoking  no cigarettes. He has occasionally used alcohol in the past but denies any  illicit drug use.   FAMILY HISTORY:  The patient's mother died at age 67 of cancer. The   patient's father died at age 50 of natural causes.  The patient has 1  brother who died of suicide. One brother who is alive with a history of  valve replacement with defibrillator placement.  One sister is alive with no  known health problems.   REVIEW OF SYSTEMS:  CONSTITUTIONAL:  No fevers, chills, sweats.  HEENT: No  headache, no acute auditory or visual changes. The patient is edentulous,  has upper and lower dentures with no acute complaints.  SKIN:  He reports no  rash.  CARDIOPULMONARY:  He does describe chest pain and shortness of breath  and chronic dyspnea on exertion as described above.  He also reports vertigo  but does not report any claudication, cough, wheeze, edema or palpitations.  GI/GU:  He reports no acute bowel or bladder complaints. PSYCH:  He has no  neuropsychiatric symptoms.  ENDOCRINE:  He reports no heat or cold  intolerance, no hair changes, recent weight gain or weight loss. All other  systems are negative.   PHYSICAL EXAMINATION:  VITAL SIGNS:  Temperature 97, heart rate 68,  respiratory rate 18, blood pressure 116/60.  Oxygen saturation 99% on 2  liters nasal cannula.  GENERAL:  The patient is pleasant, cooperative and in no apparent distress.  He answers questions appropriately although somewhat tangentially.  HEENT:  Head is normocephalic, atraumatic. Extraocular movements are intact.  Pupils are equal, round and reactive to light.  Oropharynx is pink and moist  with no teeth but dentures in place, upper and lower.  NECK:  Supple without evidence of bruit.  CARDIOVASCULAR:  Regular S1 and S2, there is a crescendo, decrescendo murmur  that radiates to the neck. Carotid pulses however are 2+ and symmetric, do  not appear delayed.  S2 may be somewhat soft, however, this heart sound is  preserved and the murmur does not appear to be late peaking.  LUNGS:  Clear to auscultation bilaterally.  BRIEF SKIN EXAMINATION:  Reveals no acute rash. ABDOMEN:  Soft,  nontender, nondistended with positive bowel sounds.  LOWER EXTREMITIES: Examination reveals no evidence of edema. Femoral pulses  are 2+ and symmetric bilaterally, there is no evidence of femoral bruit.  NEUROLOGIC EXAM:  Grossly nonfocal. The patient is able to move all 4  extremities without difficulty.  He is alert and answers questions  appropriately.   Chest x-ray not performed in the ER as of yet.   EKG has sinus rhythm  with a rate of 70, there is a normal axis, normal PR,  QRS and QTC intervals. There are anteroseptal borderline Q waves, there are  no changes diagnostic of ischemia.  There is no evidence of hypertrophy.   LABORATORY VALUES:  Hematocrit 37. Sodium 128, potassium 3.9, chloride 107,  bicarb 28, BUN 24, creatinine 1.6, glucose 95, anion gap 3. Troponin I less  than 0.05, myoglobin of 106, CK-MB 2.9.  VBG with a pH of 7.4.   ASSESSMENT AND PLAN:  1.  An 77 year old male, hypertension, hyperlipidemia, followed by Dr. Daleen Squibb      for coronary artery disease with a history of myocardial infarction and      2-vessel percutaneous coronary intervention in 1994, reporting to the ER      with 11 months of atypical and positional, non exertional chest/back      burning which has become worse tonight, especially over the chest,      reminiscent of discomfort experienced with the patient's myocardial      infarction in 1994.  The patient's EKG reveals no changes diagnostic of      ischemia. CK, myoglobin and troponin are negative x1.  There is a      crescendo, decrescendo murmur on exam with a somewhat soft S2, however      the S2 is preserved and carotid pulses are preserved and not appreciably      delayed and the murmur is not late peaking.   1.  Atypical chest pain with crescendo, decrescendo murmur as described      above. Will continue aspirin, Plavix, nitrates and monitor on telemetry      while serial cardiac markers are drawn to exclude myocardial infarction.      We  will write an order for a transthoracic echocardiogram to exclude      significant aortic stenosis. We will also check a chest x-ray to further      evaluate the patient's atypical symptoms.  If the patient's cardiac      markers are negative and the transthroacic echocardiogram is      unremarkable, it would be reasonable to discharge the patient will plans      for an outpatient stress-perfusion imaging study to be arranged through      the cardiology clinic with Dr. Daleen Squibb.  It is possible, particularly with      reports of worsening of discomfort with supine position and sensation of      chest/back burning, that patient's symptoms might be related to      gastric reflux disease/esophagitis and will initiate empiric therapy      with a proton pump inhibitor and consider discontinuation of the      patient's nonsteroidal medication if the patient's cardiovascular exam      is negative and discomfort persists, and GI evaluation could be      entertained.  1.  Hyperlipidemia with history of statin intolerance secondary to      weakness.  We will continue Zetia as previously prescribed and check a      lipid profile with morning labs to assess as to whether or not patient's      LDL and triglycerides are at goal (LDL less than 70 and triglycerides      less than 150).   1.  History of Atenolol being changed to Diltiazem for unclear reasons      although the patient does not know exactly why his beta blocker was  changed to Diltiazem (possibly for reasons of fatigue, but again it is      unclear).  I will allow patient to follow this up with his outpatient      physicians, both Dr. Daleen Squibb and Dr. Clent Ridges.  For now will continue Diltiazem      as previously prescribed.   1.  Chronic renal insufficiency with a baseline creatinine of 1.5, current      creatinine of 1.6. The patient's creatinine is near his baseline and we      will treat him empirically with 500 cc of IV saline to eliminate  any      prerenal component. The patient's mild azotemia appeared, we will      recheck creatinine in the morning.   1.  History of vertigo. Will continue Meclizine p.r.n. as previously      prescribed. No acute exacerbation.   1.  Chronic fatigue/poor exercise tolerance. Will check transthoracic      echocardiogram as mentioned, to exclude significant aortic stenosis.      Will also check a TSH and free T4, a thyroid profile, along with GI      panel and white blood cell count to exclude other occult abnormalities      that might explain the patient's difficulty with exertion.   1.  Hypertension. Continue Tiazac, hydrochlorothiazide combination as      described above.   1.  Benign prostatic hypertrophy. Continue Doxazosin as previously      prescribed.   1.  History of chronic pain.  Will continue Mavik for now although if there      is a suspicion of esophagitis/GI discomfort at end of patient's      evaluation, consideration of an empiric trial off of nonsteroidals may      be reasonable.       DDH/MEDQ  D:  02/02/2004  T:  02/02/2004  Job:  [id number]   cc:   Thomas C. Wall, M.D.   Tera Mater. Clent Ridges, M.D. [facility]

## 2010-07-30 NOTE — Discharge Summary (Signed)
. [facility]  Patient:    Thomas [name], Thomas [name]                      MRN: [id number] Adm. Date:  [id number] Disc. Date: 01/07/00 Attending:  Herold Harms Dictator:   Dian Situ, PA CC:         Claude Manges. Cleophas Dunker, M.D.  Feliciana Rossetti, M.D.   Discharge Summary  DISCHARGE DIAGNOSES: 1. Status post left total knee replacement. 2. History of recent left shin laceration. 3. Postoperative anemia. 4. Hypertension. 5. Benign prostatic hypertrophy. 6. Hypercholesterolemia.  HISTORY OF PRESENT ILLNESS:  Mr. Levingston is an 77 year old male with history of progressive left knee pain, limiting activity levels.  He elected to undergo left total knee replacement on December 30, 1999, by Dr. Cleophas Dunker. Postoperatively, he is partially weightbearing and on Coumadin for DVT prophylaxis.  Noted to have low grade fevers.  He has received a total of 1 unit of autologous packed red blood cells.  Has been making progress.  Min assist for bed mobility, close supervision for transfers, steady to min assist for ambulating 125 feet.  Rehab consulted for progressive independence goals.  PAST MEDICAL HISTORY: 1. Hypertension. 2. History of myocardial infarction with PTCA in 1994. 3. Appendectomy. 4. History of bladder cancer. 5. Cholecystectomy. 6. Bilateral knee scope. 7. Bilateral ______ scope. 8. History of ______ in the past.  ALLERGIES:  SULFA.  SOCIAL HISTORY:  The patient is married, lives with wife in one level home with four steps at entry.  Was independent prior to admission.  He drinks 2 ounces of alcohol a day.  Does not use any tobacco.  HOSPITAL COURSE:  Mr. Anacleto Batterman was admitted to rehab on January 03, 2000, for inpatient therapies to consist of PT and OT daily.  Past admission he has been maintained on Coumadin for DVT prophylaxis.  Bilateral lower extremity duplex done on admission showed no evidence of DVT.  At time of  admission the patient was noted to have a left shin incision with sutures left in shin incision secondary to trauma a few days prior to surgery.  This was noted to be slightly erythematous.  No drainage noted.  The patients left knee incision was noted to be clean and dry, healing well, no signs of infection or drainage noted during his stay.  Staples remain intact, and the patient is to follow up with Dr. Cleophas Dunker in the next 5 to 10 days for staple removal.  On the p.m. of admission the patient was noted to spike a temperature of 101.2. Blood cultures x 2 were done, and currently show no growth.  Urine culture done has shown no growth either.  Chest x-ray, PA and lateral, shows no signs of acute disease.  The patient was noted to have a low grade fever on a.m. of January 04, 2000, and as his left shin incision looked like it had early signs of cellulitis, he was started on Tequin for this.  The patient is to continue on Tequin for four additional days for treatment of cellulitis and for wound prophylaxis.  Sutures on left shin were discontinued, and area steri-stripped. He is noted to have some serous drainage from this laceration and a dry dressing is currently being used.  The patient has been afebrile in the last few days and blood pressures have been controlled.  LABORATORY DATA:  Admission labs showed postoperative anemia to be stable with hemoglobin and hematocrit of 10.3  and 29.2.  Electrolytes revealed sodium 141, potassium 3.8, chloride 107, CO2 27, BUN 16, creatinine 1.2, glucose 102.  The patient to continue on Coumadin for three additional weeks to complete his DVT prophylaxis course.  At the time of discharge, the patients knee flexion is still limited at approximately 86 degrees.  He is currently modified independent for ADL needs, modified independent for ambulation, modified independent for transfers.  He is able to navigate four steps with a standard walker with min  assist.  Followup therapies in terms of home health, PT, and OT to continue via [facility].  Also, home health RN has been arranged for prothrombin times on Monday with results to Dr. Charlane Ferretti office.  On January 07, 2000, the patient is discharged to home in improved condition.  DISCHARGE MEDICATIONS: 1. Coumadin 1.5 mg q.d. 2. Mavik 4 mg q.d. 3. Cardura 2 mg q.h.s. 4. Atenolol 50 mg q.h.s. 5. Allegra q.h.s. 6. Zocor 20 mg q.h.s. 7. Trinsicon one p.o. b.i.d. 8. Vicodin one or two p.o. q.4-6h. p.r.n. pain. 9. Tequin 400 mg one p.o. q.d. x 4 days.  ACTIVITY:  Partial weightbearing of left lower extremity.  Use a walker.  DIET:  Low fat.  SPECIAL INSTRUCTIONS:  Keep area clean and dry.  Keep dry dressing on left shin laceration.  No alcohol, no smoking, no driving.  No aspirin or aspirin products or NSAIDS while on Coumadin.  FOLLOWUP:  The patient is to follow up with Dr. Cleophas Dunker for staple removal in the next 3 to 5 days.  Follow up with Dr. Quintella Reichert for prothrombin time draws.  Follow up with Dr. Lamar Benes as needed. DD:  01/06/00 TD:  01/07/00 Job: [id number]

## 2010-08-05 ENCOUNTER — Ambulatory Visit (INDEPENDENT_AMBULATORY_CARE_PROVIDER_SITE_OTHER): Payer: Medicare Other | Admitting: Family Medicine

## 2010-08-05 DIAGNOSIS — E538 Deficiency of other specified B group vitamins: Secondary | ICD-10-CM

## 2010-08-05 MED ORDER — CYANOCOBALAMIN 1000 MCG/ML IJ SOLN
1000.0000 ug | Freq: Once | INTRAMUSCULAR | Status: AC
  Administered 2010-08-05: 1000 ug via INTRAMUSCULAR

## 2010-09-06 ENCOUNTER — Ambulatory Visit (INDEPENDENT_AMBULATORY_CARE_PROVIDER_SITE_OTHER): Payer: Medicare Other | Admitting: Family Medicine

## 2010-09-06 DIAGNOSIS — E538 Deficiency of other specified B group vitamins: Secondary | ICD-10-CM

## 2010-09-06 MED ORDER — CYANOCOBALAMIN 1000 MCG/ML IJ SOLN
1000.0000 ug | Freq: Once | INTRAMUSCULAR | Status: AC
  Administered 2010-09-06: 1000 ug via INTRAMUSCULAR

## 2010-09-21 ENCOUNTER — Other Ambulatory Visit: Payer: Self-pay | Admitting: Family Medicine

## 2010-09-21 DIAGNOSIS — I10 Essential (primary) hypertension: Secondary | ICD-10-CM

## 2010-09-21 DIAGNOSIS — N400 Enlarged prostate without lower urinary tract symptoms: Secondary | ICD-10-CM

## 2010-09-21 MED ORDER — TRANDOLAPRIL 4 MG PO TABS: 4.0000 mg | ORAL_TABLET | Freq: Every day | ORAL | Status: DC

## 2010-09-21 MED ORDER — TAMSULOSIN HCL 0.4 MG PO CAPS: 0.4000 mg | ORAL_CAPSULE | Freq: Every day | ORAL | Status: DC

## 2010-09-21 MED ORDER — DILTIAZEM HCL ER BEADS 240 MG PO CP24: 240.0000 mg | ORAL_CAPSULE | Freq: Every day | ORAL | Status: DC

## 2010-09-21 MED ORDER — CLOPIDOGREL BISULFATE 75 MG PO TABS: 75.0000 mg | ORAL_TABLET | Freq: Every day | ORAL | Status: DC

## 2010-10-18 ENCOUNTER — Encounter: Payer: Self-pay | Admitting: Cardiology

## 2010-11-05 ENCOUNTER — Ambulatory Visit (INDEPENDENT_AMBULATORY_CARE_PROVIDER_SITE_OTHER): Payer: Medicare Other | Admitting: Family Medicine

## 2010-11-05 DIAGNOSIS — E539 Vitamin B deficiency, unspecified: Secondary | ICD-10-CM

## 2010-11-05 MED ORDER — CYANOCOBALAMIN 1000 MCG/ML IJ SOLN
1000.0000 ug | Freq: Once | INTRAMUSCULAR | Status: AC
  Administered 2010-11-05: 1000 ug via INTRAMUSCULAR

## 2010-11-09 ENCOUNTER — Telehealth: Payer: Self-pay | Admitting: Family Medicine

## 2010-11-09 NOTE — Telephone Encounter (Signed)
Pt left message on triage line. Stated he spoke with you last time and expressed concern that he was on 2 meds that were essentially the same drug. You asked him to call back and let us know what they were. Per his message, they are: Tamsulosin 0.4 mg and  Trandolapril 4 mg. He would like a call with advice. Thanks.

## 2010-11-09 NOTE — Telephone Encounter (Signed)
These are actually 2 totally different meds. One  is for BPH and the other is for HTN. He should stay on both of them

## 2010-11-10 NOTE — Telephone Encounter (Signed)
Spoke with wife and gave below info.

## 2010-11-11 ENCOUNTER — Telehealth: Payer: Self-pay | Admitting: Family Medicine

## 2010-11-11 NOTE — Telephone Encounter (Signed)
Pt requesting a refill on his Nitro. Please call in to the [facility] on [address].

## 2010-11-12 MED ORDER — NITROGLYCERIN 0.4 MG SL SUBL: 0.4000 mg | SUBLINGUAL_TABLET | SUBLINGUAL | Status: DC | PRN

## 2010-11-12 NOTE — Telephone Encounter (Signed)
I called in script to [facility] [phone] per pt request.

## 2010-11-16 ENCOUNTER — Ambulatory Visit: Payer: Medicare Other | Admitting: Cardiology

## 2010-12-01 ENCOUNTER — Telehealth: Payer: Self-pay | Admitting: *Deleted

## 2010-12-01 DIAGNOSIS — N400 Enlarged prostate without lower urinary tract symptoms: Secondary | ICD-10-CM

## 2010-12-01 DIAGNOSIS — I10 Essential (primary) hypertension: Secondary | ICD-10-CM

## 2010-12-01 NOTE — Telephone Encounter (Signed)
Pt is asking that all his prescriptions be refilled to [facility], and call him when this has been done, please.

## 2010-12-03 ENCOUNTER — Ambulatory Visit (INDEPENDENT_AMBULATORY_CARE_PROVIDER_SITE_OTHER): Payer: Medicare Other | Admitting: Cardiology

## 2010-12-03 ENCOUNTER — Encounter: Payer: Self-pay | Admitting: Cardiology

## 2010-12-03 VITALS — BP 118/62 | HR 67 | Ht 70.0 in | Wt 156.0 lb

## 2010-12-03 DIAGNOSIS — I252 Old myocardial infarction: Secondary | ICD-10-CM

## 2010-12-03 DIAGNOSIS — I2510 Atherosclerotic heart disease of native coronary artery without angina pectoris: Secondary | ICD-10-CM

## 2010-12-03 DIAGNOSIS — I359 Nonrheumatic aortic valve disorder, unspecified: Secondary | ICD-10-CM

## 2010-12-03 DIAGNOSIS — I6529 Occlusion and stenosis of unspecified carotid artery: Secondary | ICD-10-CM

## 2010-12-03 MED ORDER — TAMSULOSIN HCL 0.4 MG PO CAPS: 0.4000 mg | ORAL_CAPSULE | Freq: Every day | ORAL | Status: DC

## 2010-12-03 MED ORDER — CLOPIDOGREL BISULFATE 75 MG PO TABS: 75.0000 mg | ORAL_TABLET | Freq: Every day | ORAL | Status: DC

## 2010-12-03 MED ORDER — NITROGLYCERIN 0.4 MG SL SUBL: 0.4000 mg | SUBLINGUAL_TABLET | SUBLINGUAL | Status: DC | PRN

## 2010-12-03 MED ORDER — DILTIAZEM HCL ER BEADS 240 MG PO CP24: 240.0000 mg | ORAL_CAPSULE | Freq: Every day | ORAL | Status: DC

## 2010-12-03 MED ORDER — TRANDOLAPRIL 4 MG PO TABS: 4.0000 mg | ORAL_TABLET | Freq: Every day | ORAL | Status: DC

## 2010-12-03 NOTE — Telephone Encounter (Signed)
done

## 2010-12-03 NOTE — Patient Instructions (Signed)
Your physician recommends that you schedule a follow-up appointment in: 1 year with Dr. Wall  

## 2010-12-03 NOTE — Assessment & Plan Note (Signed)
Stable. Continue current medical therapy. 

## 2010-12-03 NOTE — Assessment & Plan Note (Signed)
Stable. Continue medical therapy 

## 2010-12-03 NOTE — Progress Notes (Signed)
HPI Mr. Thomas [name] returns today for evaluation and management of his coronary disease, nonobstructive carotid disease, history of MI.  He denies any angina or ischemic symptoms. His mobility is limited now by lack of balance from neuropathy. He has stopped fishing and playing golf.  He denies any orthostatic symptoms. He has not fallen.  Medications reviewed. He is compliant. Laboratory data and other healthcare issues followed by Dr. Clent Ridges.  EKG shows normal sinus rhythm nonspecific ST segment changes. Past Medical History  Diagnosis Date  . Mild aortic stenosis     last echo 08/2006  . Coronary artery disease     nonobstructive  . Myocardial infarction   . Hyperlipidemia   . Hypertension   . Vitamin B12 deficiency   . Neuropathy   . Allergic rhinitis   . OA (osteoarthritis)   . History of bladder cancer   . GERD (gastroesophageal reflux disease)   . Esophageal stricture     Past Surgical History  Procedure Date  . Coronary angioplasty   . Appendectomy   . Cholecystectomy   . Tonsillectomy   . Sigmoidoscopy 1999  . Colonoscopy   . Bilateral elbow surgery   . Left knee replacement   . Carotid endarterectomy 08/2008    Family History  Problem Relation Age of Onset  . Coronary artery disease Brother   . Prostate cancer      first degree relative    History   Social History  . Marital Status: Married    Spouse Name: N/A    Number of Children: N/A  . Years of Education: N/A   Occupational History  . Not on file.   Social History Main Topics  . Smoking status: Former Smoker    Quit date: 03/14/1968  . Smokeless tobacco: Not on file  . Alcohol Use: Yes     occasional  . Drug Use: No  . Sexually Active:    Other Topics Concern  . Not on file   Social History Narrative  . No narrative on file    Allergies  Allergen Reactions  . Sulfamethoxazole     REACTION: unspecified    Current Outpatient Prescriptions  Medication Sig Dispense Refill  . aspirin  81 MG tablet Take 81 mg by mouth daily.        . clopidogrel (PLAVIX) 75 MG tablet Take 1 tablet (75 mg total) by mouth daily.  90 tablet  0  . diltiazem (TIAZAC) 240 MG 24 hr capsule Take 1 capsule (240 mg total) by mouth daily.  90 capsule  0  . Multiple Vitamins-Minerals (CENTRUM SILVER PO) Take by mouth daily.        . nitroGLYCERIN (NITROSTAT) 0.4 MG SL tablet Place 1 tablet (0.4 mg total) under the tongue every 5 (five) minutes as needed.  25 tablet  0  . polyethylene glycol (MIRALAX / GLYCOLAX) packet Take 17 g by mouth daily.        . Sennosides (SENNA LAX PO) Take by mouth as needed.        . Tamsulosin HCl (FLOMAX) 0.4 MG CAPS Take 1 capsule (0.4 mg total) by mouth daily.  90 capsule  0  . trandolapril (MAVIK) 4 MG tablet Take 1 tablet (4 mg total) by mouth daily.  90 tablet  0    ROS Negative other than HPI.   PE General Appearance: well developed, well nourished in no acute distress, looks younger than his age HEENT: symmetrical face, PERRLA, good dentition  Neck: no JVD,  thyromegaly, or adenopathy, trachea midline Chest: symmetric without deformity Cardiac: PMI non-displaced, RRR, normal S1, S2, no gallop, 2/6 systolic murmur S2 splits Lung: clear to ausculation and percussion Vascular right carotid bruit, diminished pulses in the lower extremities Abdominal: nondistended, nontender, good bowel sounds, no HSM, no bruits Extremities: no cyanosis, clubbing or edema, no sign of DVT, no varicosities  Skin: normal color, no rashes Neuro: alert and oriented x 3, non-focal Pysch: normal affect Filed Vitals:   12/03/10 1356  BP: 118/62  Pulse: 67  Height: 5\' 10"  (1.778 m)  Weight: 156 lb (70.761 kg)    EKG  Labs and Studies Reviewed.   Lab Results  Component Value Date   WBC 5.8 11/05/2008   HGB 14.6 11/05/2008   HCT 42.3 11/05/2008   MCV 97.2 11/05/2008   PLT 159.0 11/05/2008      Chemistry      Component Value Date/Time   NA 145 11/05/2008 0920   K 4.3 11/05/2008  0920   CL 109 11/05/2008 0920   CO2 27 11/05/2008 0920   BUN 20 11/05/2008 0920   CREATININE 1.5 11/05/2008 0920      Component Value Date/Time   CALCIUM 8.8 11/05/2008 0920   ALKPHOS 47 11/05/2008 0920   AST 21 11/05/2008 0920   ALT 13 11/05/2008 0920   BILITOT 0.9 11/05/2008 0920       Lab Results  Component Value Date   CHOL 267* 11/05/2008   CHOL 301* 05/05/2008   Lab Results  Component Value Date   HDL 48.80 11/05/2008   HDL 16.1 05/05/2008   No results found for this basename: LDLCALC   Lab Results  Component Value Date   TRIG 224.0* 11/05/2008   TRIG 357* 05/05/2008   Lab Results  Component Value Date   CHOLHDL 5 11/05/2008   CHOLHDL 6.4 CALC 05/05/2008   No results found for this basename: HGBA1C   Lab Results  Component Value Date   ALT 13 11/05/2008   AST 21 11/05/2008   ALKPHOS 47 11/05/2008   BILITOT 0.9 11/05/2008   Lab Results  Component Value Date   TSH 4.22 11/05/2008

## 2010-12-13 ENCOUNTER — Ambulatory Visit (INDEPENDENT_AMBULATORY_CARE_PROVIDER_SITE_OTHER): Payer: Medicare Other | Admitting: Family Medicine

## 2010-12-13 DIAGNOSIS — E538 Deficiency of other specified B group vitamins: Secondary | ICD-10-CM

## 2010-12-13 DIAGNOSIS — Z23 Encounter for immunization: Secondary | ICD-10-CM

## 2010-12-13 MED ORDER — CYANOCOBALAMIN 1000 MCG/ML IJ SOLN
1000.0000 ug | Freq: Once | INTRAMUSCULAR | Status: AC
  Administered 2010-12-13: 1000 ug via INTRAMUSCULAR

## 2010-12-17 ENCOUNTER — Other Ambulatory Visit: Payer: Self-pay | Admitting: Family Medicine

## 2010-12-17 NOTE — Telephone Encounter (Signed)
Pt need mailorder refills on trandolapril 4mg  #90,plavix 75mg  #90 and tamsulosin 0.4mg  #90 with 3 refills [facility] [phone].

## 2010-12-21 NOTE — Telephone Encounter (Signed)
I spoke with pt and the order was placed in Sept. 2012 and pt has already received medications in the mail.

## 2010-12-23 ENCOUNTER — Other Ambulatory Visit: Payer: Self-pay | Admitting: Family Medicine

## 2010-12-23 NOTE — Telephone Encounter (Signed)
[facility] trandolapril 4 mg#90 and diltiazem 240 mg #90 with 3 refills. Please resubmit

## 2011-01-07 ENCOUNTER — Ambulatory Visit (INDEPENDENT_AMBULATORY_CARE_PROVIDER_SITE_OTHER): Payer: Medicare Other | Admitting: Family Medicine

## 2011-01-07 ENCOUNTER — Other Ambulatory Visit: Payer: Self-pay

## 2011-01-07 ENCOUNTER — Encounter: Payer: Self-pay | Admitting: Family Medicine

## 2011-01-07 VITALS — BP 124/76 | HR 100 | Temp 97.6°F | Wt 155.0 lb

## 2011-01-07 DIAGNOSIS — I10 Essential (primary) hypertension: Secondary | ICD-10-CM

## 2011-01-07 DIAGNOSIS — J40 Bronchitis, not specified as acute or chronic: Secondary | ICD-10-CM

## 2011-01-07 MED ORDER — AZITHROMYCIN 250 MG PO TABS: ORAL_TABLET | ORAL | Status: AC

## 2011-01-07 NOTE — Progress Notes (Signed)
  Subjective:    Patient ID: Thomas [name], male    DOB: 01/05/1917, 77 y.o.   MRN: [id number]  HPI Here for one week of chest tightness and coughing up yellow sputum. No SOB or chest pain or fever. Drinking fluids. Also his BP has ben running low, often in the systolic range of 110-115.   Review of Systems  Constitutional: Negative.   HENT: Negative.   Eyes: Negative.   Respiratory: Positive for cough and chest tightness.   Cardiovascular: Negative.        Objective:   Physical Exam  Constitutional: He appears well-developed and well-nourished.  HENT:  Right Ear: External ear normal.  Left Ear: External ear normal.  Nose: Nose normal.  Mouth/Throat: Oropharynx is clear and moist. No oropharyngeal exudate.  Eyes: Conjunctivae are normal. Pupils are equal, round, and reactive to light.  Neck: No thyromegaly present.  Cardiovascular: Normal rate, regular rhythm, normal heart sounds and intact distal pulses.   Pulmonary/Chest: Effort normal and breath sounds normal. No respiratory distress. He has no wheezes. He has no rales. He exhibits no tenderness.  Lymphadenopathy:    He has no cervical adenopathy.          Assessment & Plan:  Try a Zpack. Add Mucinex. Stop the Diltiazem.

## 2011-01-07 NOTE — Telephone Encounter (Signed)
Pt called and stated he was in this morning to see doctor and pt was asked if he had maxik 4 mg.  Pt states he went home and checked and he does not have any medication. Pt request a 90 day supply be sent in to [facility].

## 2011-01-10 MED ORDER — TRANDOLAPRIL 4 MG PO TABS: 4.0000 mg | ORAL_TABLET | Freq: Every day | ORAL | Status: DC

## 2011-01-10 NOTE — Telephone Encounter (Signed)
Script sent e-scribe to [facility]. 

## 2011-01-27 ENCOUNTER — Telehealth: Payer: Self-pay | Admitting: *Deleted

## 2011-01-27 NOTE — Telephone Encounter (Signed)
Pt wants to know if Dr. Clent Ridges does joint injections??

## 2011-01-27 NOTE — Telephone Encounter (Signed)
No I do not. 

## 2011-01-28 NOTE — Telephone Encounter (Signed)
Notified pt's wife  

## 2011-02-02 NOTE — Telephone Encounter (Signed)
Please print and fax both scripts. Pt never received the script from 01/10/11.

## 2011-02-07 MED ORDER — TRANDOLAPRIL 4 MG PO TABS: 4.0000 mg | ORAL_TABLET | Freq: Every day | ORAL | Status: DC

## 2011-02-07 MED ORDER — DILTIAZEM HCL ER BEADS 240 MG PO CP24: 240.0000 mg | ORAL_CAPSULE | Freq: Every day | ORAL | Status: DC

## 2011-02-07 NOTE — Telephone Encounter (Signed)
Scripts were printed and faxed. 

## 2011-02-08 ENCOUNTER — Ambulatory Visit (INDEPENDENT_AMBULATORY_CARE_PROVIDER_SITE_OTHER): Payer: Medicare Other | Admitting: Family Medicine

## 2011-02-08 DIAGNOSIS — E539 Vitamin B deficiency, unspecified: Secondary | ICD-10-CM

## 2011-02-08 MED ORDER — CYANOCOBALAMIN 1000 MCG/ML IJ SOLN
1000.0000 ug | Freq: Once | INTRAMUSCULAR | Status: AC
  Administered 2011-02-08: 1000 ug via INTRAMUSCULAR

## 2011-02-21 ENCOUNTER — Other Ambulatory Visit: Payer: Self-pay

## 2011-02-21 MED ORDER — TRANDOLAPRIL 4 MG PO TABS: 4.0000 mg | ORAL_TABLET | Freq: Every day | ORAL | Status: DC

## 2011-02-21 NOTE — Telephone Encounter (Signed)
Pt has not received Rx for Mavik from [facility].   Rx was faxed on 02/07/2011. Pt would like Rx to be resent.

## 2011-03-03 ENCOUNTER — Ambulatory Visit: Payer: Medicare Other | Admitting: Family Medicine

## 2011-03-29 ENCOUNTER — Telehealth: Payer: Self-pay | Admitting: *Deleted

## 2011-03-29 NOTE — Telephone Encounter (Signed)
Pt would like his Plavix refills sent to [facility], please.  90 day supplies.

## 2011-03-30 NOTE — Telephone Encounter (Signed)
Rx was sent back in 11/2010 #90 with 3 refills. Left message with wife about this.

## 2011-04-13 ENCOUNTER — Telehealth: Payer: Self-pay | Admitting: Family Medicine

## 2011-04-13 NOTE — Telephone Encounter (Signed)
Pt req 90 day supply of clopidogrel (PLAVIX) 75 MG tablet to [facility] mail order pharmacy.

## 2011-04-15 NOTE — Telephone Encounter (Signed)
I spoke with pt and there is plenty of refills left on this script. Advised pt to contact pharmacy.

## 2011-04-21 ENCOUNTER — Telehealth: Payer: Self-pay | Admitting: Family Medicine

## 2011-04-21 NOTE — Telephone Encounter (Signed)
Patient states that his mail-order pharmacy ([facility] is not sending his rx because they are stating that he has received it. Patient states he never received his plavix and would like the nurse to call [facility] and in the mean time send an rx for plavix to [facility] [place]. He would like for someone to follow up on this as he has been out of his plavix for 2months. Please assist and call pt back with update.

## 2011-04-21 NOTE — Telephone Encounter (Signed)
I called in a 30 day supply of Plavix to [facility]. I called [facility] and the order did ship on 12/08/10. There are refills left and pt can call them to request a refill. I spoke with pt and explained this.

## 2011-05-16 ENCOUNTER — Ambulatory Visit (INDEPENDENT_AMBULATORY_CARE_PROVIDER_SITE_OTHER): Payer: Medicare Other | Admitting: Family Medicine

## 2011-05-16 DIAGNOSIS — E538 Deficiency of other specified B group vitamins: Secondary | ICD-10-CM

## 2011-05-16 MED ORDER — CYANOCOBALAMIN 1000 MCG/ML IJ SOLN
1000.0000 ug | Freq: Once | INTRAMUSCULAR | Status: AC
  Administered 2011-05-16: 1000 ug via INTRAMUSCULAR

## 2011-05-20 ENCOUNTER — Telehealth: Payer: Self-pay | Admitting: *Deleted

## 2011-05-20 NOTE — Telephone Encounter (Signed)
Opened in error, calling for spouse.

## 2011-06-16 ENCOUNTER — Ambulatory Visit (INDEPENDENT_AMBULATORY_CARE_PROVIDER_SITE_OTHER): Payer: Medicare Other | Admitting: Family Medicine

## 2011-06-16 ENCOUNTER — Telehealth: Payer: Self-pay | Admitting: Family Medicine

## 2011-06-16 DIAGNOSIS — E539 Vitamin B deficiency, unspecified: Secondary | ICD-10-CM

## 2011-06-16 MED ORDER — CYANOCOBALAMIN 1000 MCG/ML IJ SOLN
1000.0000 ug | Freq: Once | INTRAMUSCULAR | Status: AC
  Administered 2011-06-16: 1000 ug via INTRAMUSCULAR

## 2011-06-16 NOTE — Telephone Encounter (Signed)
Pt was here in office for his Vit B12 injection and wanted to ask about Flonase. He said that his wife has been using it and with good results. He wants to know if he could try this also. If so then please send his script for a 90 day supply to his mail order. He knows that you will not be back until next week.

## 2011-06-19 NOTE — Telephone Encounter (Signed)
Call in one year of Flonase to use 2 sprays in each nostril daily

## 2011-06-21 MED ORDER — FLUTICASONE PROPIONATE 50 MCG/ACT NA SUSP: 2.0000 | Freq: Every day | NASAL | Status: DC

## 2011-07-14 ENCOUNTER — Ambulatory Visit (INDEPENDENT_AMBULATORY_CARE_PROVIDER_SITE_OTHER): Payer: Medicare Other | Admitting: Family Medicine

## 2011-07-14 DIAGNOSIS — E538 Deficiency of other specified B group vitamins: Secondary | ICD-10-CM

## 2011-07-14 MED ORDER — CYANOCOBALAMIN 1000 MCG/ML IJ SOLN
1000.0000 ug | Freq: Once | INTRAMUSCULAR | Status: AC
  Administered 2011-07-14: 1000 ug via INTRAMUSCULAR

## 2011-07-19 ENCOUNTER — Encounter: Payer: Self-pay | Admitting: Family Medicine

## 2011-07-19 ENCOUNTER — Ambulatory Visit (INDEPENDENT_AMBULATORY_CARE_PROVIDER_SITE_OTHER): Payer: Medicare Other | Admitting: Family Medicine

## 2011-07-19 VITALS — BP 112/74 | HR 101 | Temp 97.8°F | Wt 156.0 lb

## 2011-07-19 DIAGNOSIS — R29898 Other symptoms and signs involving the musculoskeletal system: Secondary | ICD-10-CM

## 2011-07-19 NOTE — Progress Notes (Signed)
  Subjective:    Patient ID: Thomas [name], male    DOB: [birth year], 78 y.o.   MRN: [id number]  HPI Here after a fall at home last night. There was no LOC and he has full memory of the incident. He has a long hx of neuropathy with numbness in both feet, but last night as he stood in his kitchen he felt the right leg buckle from underneath him. There was no pain, simply a sudden weakness of the leg. He was able to go down slowly and apparently did not injure himself. No SOB or chest pains or palpitations. He has felt fine ever since. EMS was called and felt he was stable to see [place] today. No other neurologic deficits, no slurred speech, etc. He uses a single point cane most of the time.    Review of Systems  Respiratory: Negative.   Cardiovascular: Negative.   Neurological: Positive for weakness and numbness. Negative for dizziness, tremors, seizures, syncope, speech difficulty, light-headedness and headaches.       Objective:   Physical Exam  Constitutional: He is oriented to person, place, and time. He appears well-developed and well-nourished.       Alert, walks with his cane. He has some mild gait instability   Neck: Neck supple. No thyromegaly present.       No carotid bruits   Cardiovascular: Normal rate, regular rhythm and intact distal pulses.  Exam reveals no gallop and no friction rub.   Murmur heard.      He has his usual aortic stenosis murmur   Pulmonary/Chest: Effort normal and breath sounds normal. No respiratory distress. He has no wheezes. He has no rales.  Lymphadenopathy:    He has no cervical adenopathy.  Neurological: He is alert and oriented to person, place, and time. No cranial nerve deficit. He exhibits normal muscle tone.       His reflexes at the right knee are definitely less brisk than at the left knee. Strength in the legs is symmetrical           Assessment & Plan:  He seems to have weakness in the right leg, and this seems to be the reason he fell. No  evidence of a central CNS process. He could have spinal stenosis or peripheral neuropathy. We will refer to Neurology to evaluate. In the meantime I wrote a rx for him to get a rolling walker, and I stressed that he should use this at all times.

## 2011-07-20 ENCOUNTER — Encounter: Payer: Self-pay | Admitting: Neurology

## 2011-07-22 NOTE — Progress Notes (Signed)
  Subjective:    Patient ID: Thomas [name], male    DOB: 1916/09/17, 78 y.o.   MRN: [id number]  HPI    Review of Systems     Objective:   Physical Exam        Assessment & Plan:  B12 shot given

## 2011-07-25 ENCOUNTER — Telehealth: Payer: Self-pay | Admitting: Family Medicine

## 2011-07-25 DIAGNOSIS — R2681 Unsteadiness on feet: Secondary | ICD-10-CM

## 2011-07-25 NOTE — Telephone Encounter (Signed)
Pt could not get an appointment with nero until September 22, 2011 and wants to know if we can call to get pt seen any sooner? Also she would like to know if there is any particular life alert system company that you can  recommend? She wants to get one for herself & husband.

## 2011-07-26 NOTE — Telephone Encounter (Signed)
I spoke with pt and went over the below message. 

## 2011-07-26 NOTE — Telephone Encounter (Signed)
Thomas [name] will try to get him in sooner. Tell them I cannot recommend any particular life alert system

## 2011-09-22 ENCOUNTER — Ambulatory Visit: Payer: Medicare Other | Admitting: Neurology

## 2011-10-03 ENCOUNTER — Inpatient Hospital Stay (HOSPITAL_COMMUNITY)
Admission: EM | Admit: 2011-10-03 | Discharge: 2011-10-06 | DRG: 194 | Disposition: A | Discharge status: Home Health | Payer: Medicare Other | Attending: Internal Medicine | Admitting: Internal Medicine

## 2011-10-03 ENCOUNTER — Emergency Department (HOSPITAL_COMMUNITY): Payer: Medicare Other

## 2011-10-03 ENCOUNTER — Encounter (HOSPITAL_COMMUNITY): Payer: Self-pay | Admitting: *Deleted

## 2011-10-03 DIAGNOSIS — H6120 Impacted cerumen, unspecified ear: Secondary | ICD-10-CM

## 2011-10-03 DIAGNOSIS — I252 Old myocardial infarction: Secondary | ICD-10-CM

## 2011-10-03 DIAGNOSIS — N400 Enlarged prostate without lower urinary tract symptoms: Secondary | ICD-10-CM

## 2011-10-03 DIAGNOSIS — N138 Other obstructive and reflux uropathy: Secondary | ICD-10-CM

## 2011-10-03 DIAGNOSIS — K222 Esophageal obstruction: Secondary | ICD-10-CM

## 2011-10-03 DIAGNOSIS — I10 Essential (primary) hypertension: Secondary | ICD-10-CM

## 2011-10-03 DIAGNOSIS — C679 Malignant neoplasm of bladder, unspecified: Secondary | ICD-10-CM

## 2011-10-03 DIAGNOSIS — J209 Acute bronchitis, unspecified: Secondary | ICD-10-CM

## 2011-10-03 DIAGNOSIS — I359 Nonrheumatic aortic valve disorder, unspecified: Secondary | ICD-10-CM

## 2011-10-03 DIAGNOSIS — K5900 Constipation, unspecified: Secondary | ICD-10-CM

## 2011-10-03 DIAGNOSIS — I2510 Atherosclerotic heart disease of native coronary artery without angina pectoris: Secondary | ICD-10-CM

## 2011-10-03 DIAGNOSIS — R42 Dizziness and giddiness: Secondary | ICD-10-CM

## 2011-10-03 DIAGNOSIS — G589 Mononeuropathy, unspecified: Secondary | ICD-10-CM

## 2011-10-03 DIAGNOSIS — E559 Vitamin D deficiency, unspecified: Secondary | ICD-10-CM | POA: Diagnosis present

## 2011-10-03 DIAGNOSIS — R05 Cough: Secondary | ICD-10-CM

## 2011-10-03 DIAGNOSIS — I129 Hypertensive chronic kidney disease with stage 1 through stage 4 chronic kidney disease, or unspecified chronic kidney disease: Secondary | ICD-10-CM | POA: Diagnosis present

## 2011-10-03 DIAGNOSIS — J309 Allergic rhinitis, unspecified: Secondary | ICD-10-CM

## 2011-10-03 DIAGNOSIS — N401 Enlarged prostate with lower urinary tract symptoms: Secondary | ICD-10-CM

## 2011-10-03 DIAGNOSIS — J449 Chronic obstructive pulmonary disease, unspecified: Secondary | ICD-10-CM | POA: Diagnosis present

## 2011-10-03 DIAGNOSIS — R1310 Dysphagia, unspecified: Secondary | ICD-10-CM

## 2011-10-03 DIAGNOSIS — J189 Pneumonia, unspecified organism: Principal | ICD-10-CM

## 2011-10-03 DIAGNOSIS — N179 Acute kidney failure, unspecified: Secondary | ICD-10-CM | POA: Diagnosis not present

## 2011-10-03 DIAGNOSIS — E538 Deficiency of other specified B group vitamins: Secondary | ICD-10-CM

## 2011-10-03 DIAGNOSIS — K219 Gastro-esophageal reflux disease without esophagitis: Secondary | ICD-10-CM

## 2011-10-03 DIAGNOSIS — J4489 Other specified chronic obstructive pulmonary disease: Secondary | ICD-10-CM | POA: Diagnosis present

## 2011-10-03 DIAGNOSIS — N189 Chronic kidney disease, unspecified: Secondary | ICD-10-CM | POA: Diagnosis not present

## 2011-10-03 DIAGNOSIS — M1990 Unspecified osteoarthritis, unspecified site: Secondary | ICD-10-CM

## 2011-10-03 DIAGNOSIS — R059 Cough, unspecified: Secondary | ICD-10-CM

## 2011-10-03 DIAGNOSIS — I6529 Occlusion and stenosis of unspecified carotid artery: Secondary | ICD-10-CM

## 2011-10-03 DIAGNOSIS — E785 Hyperlipidemia, unspecified: Secondary | ICD-10-CM

## 2011-10-03 LAB — URINALYSIS, ROUTINE W REFLEX MICROSCOPIC
Ketones, ur: NEGATIVE mg/dL
Leukocytes, UA: NEGATIVE
Nitrite: NEGATIVE
Protein, ur: NEGATIVE mg/dL
pH: 6 (ref 5.0–8.0)

## 2011-10-03 LAB — CBC WITH DIFFERENTIAL/PLATELET
Basophils Relative: 0 % (ref 0–1)
Eosinophils Absolute: 0 10*3/uL (ref 0.0–0.7)
HCT: 43 % (ref 39.0–52.0)
Hemoglobin: 15.4 g/dL (ref 13.0–17.0)
Lymphocytes Relative: 5 % — ABNORMAL LOW (ref 12–46)
MCH: 32.9 pg (ref 26.0–34.0)
MCHC: 35.8 g/dL (ref 30.0–36.0)
Monocytes Absolute: 0.7 10*3/uL (ref 0.1–1.0)
Neutro Abs: 13.5 10*3/uL — ABNORMAL HIGH (ref 1.7–7.7)

## 2011-10-03 LAB — BASIC METABOLIC PANEL
BUN: 26 mg/dL — ABNORMAL HIGH (ref 6–23)
CO2: 24 mEq/L (ref 19–32)
GFR calc non Af Amer: 43 mL/min — ABNORMAL LOW (ref >90)
Glucose, Bld: 133 mg/dL — ABNORMAL HIGH (ref 70–99)
Potassium: 4.2 mEq/L (ref 3.5–5.1)

## 2011-10-03 LAB — LIPASE, BLOOD: Lipase: 26 U/L (ref 11–59)

## 2011-10-03 MED ORDER — IOHEXOL 350 MG/ML SOLN
100.0000 mL | Freq: Once | INTRAVENOUS | Status: AC | PRN
  Administered 2011-10-03: 100 mL via INTRAVENOUS

## 2011-10-03 MED ORDER — DEXTROSE 5 % IV SOLN
500.0000 mg | Freq: Once | INTRAVENOUS | Status: DC
  Filled 2011-10-03: qty 500

## 2011-10-03 MED ORDER — ONDANSETRON HCL 4 MG/2ML IJ SOLN
4.0000 mg | Freq: Once | INTRAMUSCULAR | Status: AC
  Administered 2011-10-03: 4 mg via INTRAVENOUS
  Filled 2011-10-03: qty 2

## 2011-10-03 MED ORDER — DEXTROSE 5 % IV SOLN
1.0000 g | Freq: Once | INTRAVENOUS | Status: AC
  Administered 2011-10-03: 1 g via INTRAVENOUS
  Filled 2011-10-03: qty 10

## 2011-10-03 MED ORDER — HYDROMORPHONE HCL PF 1 MG/ML IJ SOLN
0.5000 mg | Freq: Once | INTRAMUSCULAR | Status: AC
  Administered 2011-10-03: 0.5 mg via INTRAVENOUS
  Filled 2011-10-03: qty 1

## 2011-10-03 NOTE — ED Notes (Signed)
Pt started dry heaving.  Gave Zofran.  This is the first time today this has happened.

## 2011-10-03 NOTE — ED Notes (Signed)
Per EMS:  Pt st's he felt fine earlier today and then later started feeling short of breath and had a fever.  Pt had wet cough on arrival, EMS st's breath sounds were very congested.  Pt from home.  Resp effort isn't too labored if he stays still.  Pt conscious, alert, oriented, no hx of pneumonia.

## 2011-10-03 NOTE — ED Notes (Signed)
Bed:WA18<BR> Expected date:<BR> Expected time:<BR> Means of arrival:<BR> Comments:<BR> ems

## 2011-10-03 NOTE — ED Provider Notes (Signed)
History     CSN: [id number]  Arrival date & time 10/03/11  1940   First MD Initiated Contact with Patient 10/03/11 2000      No chief complaint on file.   (Consider location/radiation/quality/duration/timing/severity/associated sxs/prior treatment) HPI This patient presents with concerns of cough, fever, dyspnea.  He notes that he was in his usual state of health until approximately 4 hours prior to admission.  His symptoms began gradually, but progressed rapidly.  He notes significant generalized discomfort with his symptoms.  He denies concurrent confusion or disorientation.  He notes that symptoms persisted in spite of rest.  He also notes fevers and chills.  He also complains of pain between his scapula Past Medical History  Diagnosis Date  . Mild aortic stenosis     last echo 08/2006  . Coronary artery disease     nonobstructive  . Myocardial infarction   . Hyperlipidemia   . Hypertension   . Vitamin B12 deficiency   . Neuropathy   . Allergic rhinitis   . OA (osteoarthritis)   . History of bladder cancer   . GERD (gastroesophageal reflux disease)   . Esophageal stricture     Past Surgical History  Procedure Date  . Coronary angioplasty   . Appendectomy   . Cholecystectomy   . Tonsillectomy   . Sigmoidoscopy 1999  . Colonoscopy   . Bilateral elbow surgery   . Left knee replacement   . Carotid endarterectomy 08/2008    Family History  Problem Relation Age of Onset  . Coronary artery disease Brother   . Prostate cancer      first degree relative    History  Substance Use Topics  . Smoking status: Former Smoker    Quit date: 03/14/1968  . Smokeless tobacco: Never Used  . Alcohol Use: 0.0 oz/week     7      Review of Systems  Constitutional:       Per HPI, otherwise negative  HENT:       Per HPI, otherwise negative  Eyes: Negative.   Respiratory:       Per HPI, otherwise negative  Cardiovascular:       Per HPI, otherwise negative    Gastrointestinal: Negative for vomiting.  Genitourinary: Negative.   Musculoskeletal:       Per HPI, otherwise negative  Skin: Negative.   Neurological: Negative for syncope.    Allergies  Sulfamethoxazole  Home Medications   Current Outpatient Rx  Name Route Sig Dispense Refill  . ASPIRIN 81 MG PO TABS Oral Take 81 mg by mouth daily.      Marland Kitchen CLOPIDOGREL BISULFATE 75 MG PO TABS Oral Take 1 tablet (75 mg total) by mouth daily. 90 tablet 3  . DILTIAZEM HCL ER BEADS 240 MG PO CP24 Oral Take 1 capsule (240 mg total) by mouth daily. 90 capsule 3  . FLUTICASONE PROPIONATE 50 MCG/ACT NA SUSP Nasal Place 2 sprays into the nose daily. 16 g 3    Please send pt a 3 month supply, refill X 3  . ADULT MULTIVITAMIN W/MINERALS CH Oral Take 1 tablet by mouth daily.    Marland Kitchen NITROGLYCERIN 0.4 MG SL SUBL Sublingual Place 1 tablet (0.4 mg total) under the tongue every 5 (five) minutes as needed. 75 tablet 3    Script called in to [facility] [phone] per pt reque ...  . SENNA 8.6 MG PO TABS Oral Take 3 tablets by mouth at bedtime as needed. For constipation.    Marland Kitchen  TAMSULOSIN HCL 0.4 MG PO CAPS Oral Take 1 capsule (0.4 mg total) by mouth daily. 90 capsule 3  . TRANDOLAPRIL 4 MG PO TABS Oral Take 1 tablet (4 mg total) by mouth daily. 90 tablet 3    BP 186/87  Pulse 96  Temp 98.6 F (37 C) (Oral)  Resp 22  SpO2 95%  Physical Exam  Nursing note and vitals reviewed. Constitutional: He is oriented to person, place, and time. He appears well-developed. No distress.       Elderly M resting in bed  HENT:  Head: Normocephalic and atraumatic.  Eyes: Conjunctivae and EOM are normal.  Cardiovascular: Regular rhythm.  Tachycardia present.   Pulmonary/Chest: No stridor. Tachypnea noted. No respiratory distress.  Abdominal: He exhibits no distension.  Musculoskeletal: He exhibits no edema.  Neurological: He is alert and oriented to person, place, and time.       Poor hearing  Skin: Skin is warm and dry.   Psychiatric: He has a normal mood and affect.    ED Course  Procedures (including critical care time)  Labs Reviewed  CBC WITH DIFFERENTIAL - Abnormal; Notable for the following:    WBC 14.9 (*)     Neutrophils Relative 90 (*)     Lymphocytes Relative 5 (*)     Neutro Abs 13.5 (*)     All other components within normal limits  BASIC METABOLIC PANEL - Abnormal; Notable for the following:    Glucose, Bld 133 (*)     BUN 26 (*)     Creatinine, Ser 1.36 (*)     GFR calc non Af Amer 43 (*)     GFR calc Af Amer 50 (*)     All other components within normal limits  D-DIMER, QUANTITATIVE - Abnormal; Notable for the following:    D-Dimer, Quant 1.31 (*)     All other components within normal limits  LIPASE, BLOOD  URINALYSIS, ROUTINE W REFLEX MICROSCOPIC   Dg Chest 2 View  10/03/2011  *RADIOLOGY REPORT*  Clinical Data: Shortness of breath, weakness, cough and congestion.  CHEST - 2 VIEW  Comparison: 04/13/2010  Findings: Stable chest x-ray with some degree of probable underlying COPD.  Bibasilar scarring present.  No evidence of pulmonary infiltrate, edema or pleural fluid.  Stable tortuosity of the thoracic aorta.  Stable degenerative disease of the spine.  IMPRESSION: No acute process identified.  Stable chronic lung disease.  Original Report Authenticated By: Reola Calkins, M.D.   Ct Angio Chest W/cm &/or Wo Cm  10/03/2011  *RADIOLOGY REPORT*  Clinical Data: Dyspnea.  Chest pain.  CT ANGIOGRAPHY CHEST  Technique:  Multidetector CT imaging of the chest using the standard protocol during bolus administration of intravenous contrast. Multiplanar reconstructed images including MIPs were obtained and reviewed to evaluate the vascular anatomy.  Contrast: OMNIPAQUE IOHEXOL 350 MG/ML SOLN  Comparison: Chest x-ray 10/03/2011.  Findings: The chest wall is unremarkable.  No supraclavicular or axillary mass or adenopathy.  The thyroid gland is grossly normal. The bony thorax is intact.  No  destructive bone changes.  Moderate degenerative changes.  The heart is normal in size.  No pericardial effusion.  No mediastinal or hilar lymphadenopathy.  The esophagus is grossly normal.  The aorta is tortuous, ectatic and demonstrates advanced atherosclerotic change but no findings for aortic dissection or focal aneurysm.  Coronary artery calcifications are noted.  The pulmonary arterial tree is fairly well opacified.  No filling defects to suggest pulmonary embolism.  Examination of the lung parenchyma demonstrates a left lower lobe pneumonia.  Underlying emphysematous changes are noted.  No pleural effusion or pulmonary edema.  No worrisome mass lesions.  IMPRESSION:  1.  Tortuous ectatic thoracic aorta with advanced atherosclerotic changes but no focal aneurysm or dissection. 2.  Left lower lobe pneumonia. 3.  Emphysema.  Original Report Authenticated By: P. Loralie Champagne, M.D.     No diagnosis found.  Cardiac: 91 st, abnormal  Pulse ox 95%[place], abnormal   Date: 10/03/2011  Rate: 80  Rhythm: normal sinus rhythm  QRS Axis: left  Intervals: normal  ST/T Wave abnormalities: nonspecific T wave changes  Conduction Disutrbances:nonspecific intraventricular conduction delay  Narrative Interpretation:   Old EKG Reviewed: changes noted  ABNORMAL  MDM  This young male presents with fevers, chills, dyspnea.  On exam the patient is tachycardic, requiring some oxygen for oxygen saturations in the mid 90s.  Given the patient's tachypnea, description of pain in his back, and his dyspnea, or some suspicion of dissection versus infectious etiology.  The patient's evaluation is most notable for opacification of the left lower lobe: pneumonia.  Given this, the leukocytosis, the patient's age and comorbidities he'll be admitted for further evaluation and management       Gerhard Munch, MD 10/03/11 2353

## 2011-10-04 ENCOUNTER — Encounter (HOSPITAL_COMMUNITY): Payer: Self-pay | Admitting: Internal Medicine

## 2011-10-04 DIAGNOSIS — J189 Pneumonia, unspecified organism: Secondary | ICD-10-CM | POA: Diagnosis present

## 2011-10-04 DIAGNOSIS — I252 Old myocardial infarction: Secondary | ICD-10-CM

## 2011-10-04 LAB — COMPREHENSIVE METABOLIC PANEL
Albumin: 3.2 g/dL — ABNORMAL LOW (ref 3.5–5.2)
BUN: 26 mg/dL — ABNORMAL HIGH (ref 6–23)
Calcium: 8.5 mg/dL (ref 8.4–10.5)
Creatinine, Ser: 1.52 mg/dL — ABNORMAL HIGH (ref 0.50–1.35)
GFR calc Af Amer: 44 mL/min — ABNORMAL LOW (ref >90)
Potassium: 4.5 mEq/L (ref 3.5–5.1)
Total Protein: 6.2 g/dL (ref 6.0–8.3)

## 2011-10-04 LAB — CBC WITH DIFFERENTIAL/PLATELET
Basophils Relative: 0 % (ref 0–1)
Eosinophils Absolute: 0 10*3/uL (ref 0.0–0.7)
Eosinophils Relative: 0 % (ref 0–5)
Hemoglobin: 13.5 g/dL (ref 13.0–17.0)
MCH: 31.4 pg (ref 26.0–34.0)
MCHC: 33.8 g/dL (ref 30.0–36.0)
MCV: 93 fL (ref 78.0–100.0)
Monocytes Relative: 5 % (ref 3–12)
Neutrophils Relative %: 90 % — ABNORMAL HIGH (ref 43–77)

## 2011-10-04 LAB — LEGIONELLA ANTIGEN, URINE: Legionella Antigen, Urine: NEGATIVE

## 2011-10-04 MED ORDER — HYDROCODONE-ACETAMINOPHEN 5-325 MG PO TABS
1.0000 | ORAL_TABLET | Freq: Four times a day (QID) | ORAL | Status: DC | PRN
  Administered 2011-10-04: 1 via ORAL
  Filled 2011-10-04: qty 1

## 2011-10-04 MED ORDER — TAMSULOSIN HCL 0.4 MG PO CAPS
0.4000 mg | ORAL_CAPSULE | Freq: Every day | ORAL | Status: DC
  Administered 2011-10-04 – 2011-10-06 (×3): 0.4 mg via ORAL
  Filled 2011-10-04 (×3): qty 1

## 2011-10-04 MED ORDER — BOOST PLUS PO LIQD
237.0000 mL | Freq: Every day | ORAL | Status: DC
  Administered 2011-10-04 – 2011-10-05 (×2): 237 mL via ORAL
  Filled 2011-10-04 (×3): qty 237

## 2011-10-04 MED ORDER — ENOXAPARIN SODIUM 40 MG/0.4ML ~~LOC~~ SOLN
40.0000 mg | SUBCUTANEOUS | Status: DC
  Administered 2011-10-04: 40 mg via SUBCUTANEOUS
  Filled 2011-10-04 (×2): qty 0.4

## 2011-10-04 MED ORDER — CLOPIDOGREL BISULFATE 75 MG PO TABS
75.0000 mg | ORAL_TABLET | Freq: Every day | ORAL | Status: DC
  Administered 2011-10-04 – 2011-10-06 (×3): 75 mg via ORAL
  Filled 2011-10-04 (×4): qty 1

## 2011-10-04 MED ORDER — MOXIFLOXACIN HCL IN NACL 400 MG/250ML IV SOLN
400.0000 mg | INTRAVENOUS | Status: DC
  Administered 2011-10-04 – 2011-10-05 (×2): 400 mg via INTRAVENOUS
  Filled 2011-10-04 (×2): qty 250

## 2011-10-04 MED ORDER — ASPIRIN 81 MG PO CHEW
81.0000 mg | CHEWABLE_TABLET | Freq: Every day | ORAL | Status: DC
  Administered 2011-10-04 – 2011-10-06 (×3): 81 mg via ORAL
  Filled 2011-10-04 (×3): qty 1

## 2011-10-04 MED ORDER — FLUTICASONE PROPIONATE 50 MCG/ACT NA SUSP
2.0000 | Freq: Every day | NASAL | Status: DC
  Administered 2011-10-04 – 2011-10-05 (×2): 2 via NASAL
  Filled 2011-10-04: qty 16

## 2011-10-04 MED ORDER — SODIUM CHLORIDE 0.9 % IV SOLN: INTRAVENOUS | Status: AC

## 2011-10-04 MED ORDER — ZOLPIDEM TARTRATE 5 MG PO TABS
5.0000 mg | ORAL_TABLET | Freq: Every evening | ORAL | Status: DC | PRN
  Administered 2011-10-04 – 2011-10-05 (×2): 5 mg via ORAL
  Filled 2011-10-04 (×3): qty 1

## 2011-10-04 MED ORDER — NITROGLYCERIN 0.4 MG SL SUBL: 0.4000 mg | SUBLINGUAL_TABLET | SUBLINGUAL | Status: DC | PRN

## 2011-10-04 MED ORDER — PNEUMOCOCCAL VAC POLYVALENT 25 MCG/0.5ML IJ INJ
0.5000 mL | INJECTION | INTRAMUSCULAR | Status: AC
  Filled 2011-10-04: qty 0.5

## 2011-10-04 MED ORDER — L-METHYLFOLATE-B6-B12 3-35-2 MG PO TABS
1.0000 | ORAL_TABLET | Freq: Every day | ORAL | Status: DC
  Administered 2011-10-04 – 2011-10-06 (×3): 1 via ORAL
  Filled 2011-10-04 (×3): qty 1

## 2011-10-04 MED ORDER — SENNA 8.6 MG PO TABS
3.0000 | ORAL_TABLET | Freq: Every evening | ORAL | Status: DC | PRN
  Administered 2011-10-04 – 2011-10-05 (×2): 25.8 mg via ORAL
  Filled 2011-10-04 (×2): qty 3

## 2011-10-04 MED ORDER — TRANDOLAPRIL 4 MG PO TABS
4.0000 mg | ORAL_TABLET | Freq: Every day | ORAL | Status: DC
  Administered 2011-10-04 – 2011-10-06 (×3): 4 mg via ORAL
  Filled 2011-10-04 (×3): qty 1

## 2011-10-04 MED ORDER — SODIUM CHLORIDE 0.9 % IV SOLN
INTRAVENOUS | Status: DC
Start: 2011-10-04 — End: 2011-10-04
  Administered 2011-10-04: 01:00:00 via INTRAVENOUS

## 2011-10-04 MED ORDER — ASPIRIN 81 MG PO TABS: 81.0000 mg | ORAL_TABLET | Freq: Every day | ORAL | Status: DC

## 2011-10-04 MED ORDER — DILTIAZEM HCL ER COATED BEADS 180 MG PO CP24
180.0000 mg | ORAL_CAPSULE | Freq: Every day | ORAL | Status: DC
  Administered 2011-10-05 – 2011-10-06 (×2): 180 mg via ORAL
  Filled 2011-10-04 (×2): qty 1

## 2011-10-04 MED ORDER — DILTIAZEM HCL ER BEADS 240 MG PO CP24
240.0000 mg | ORAL_CAPSULE | Freq: Every day | ORAL | Status: DC
  Administered 2011-10-04: 240 mg via ORAL
  Filled 2011-10-04: qty 1

## 2011-10-04 NOTE — Progress Notes (Signed)
INITIAL ADULT NUTRITION ASSESSMENT Date: 10/04/2011   Time: 3:21 PM Reason for Assessment: Nutrition Risk for unintentional weight loss > 10 lb over 1 month  ASSESSMENT: Male 78 y.o.  Dx: cough, fever, dyspnea  Hx:  Past Medical History  Diagnosis Date  . Mild aortic stenosis     last echo 08/2006  . Coronary artery disease     nonobstructive  . Myocardial infarction   . Hyperlipidemia   . Hypertension   . Vitamin B12 deficiency   . Neuropathy   . Allergic rhinitis   . OA (osteoarthritis)   . History of bladder cancer   . GERD (gastroesophageal reflux disease)   . Esophageal stricture     Related Meds:  Scheduled Meds:   . aspirin  81 mg Oral Daily  . cefTRIAXone (ROCEPHIN)  IV  1 g Intravenous Once  . clopidogrel  75 mg Oral Daily  . diltiazem  240 mg Oral Daily  . enoxaparin (LOVENOX) injection  40 mg Subcutaneous Q24H  . fluticasone  2 spray Each Nare Daily  .  HYDROmorphone (DILAUDID) injection  0.5 mg Intravenous Once  . l-methylfolate-B6-B12  1 tablet Oral Daily  . moxifloxacin  400 mg Intravenous Q24H  . ondansetron (ZOFRAN) IV  4 mg Intravenous Once  . pneumococcal 23 valent vaccine  0.5 mL Intramuscular Tomorrow-1000  . Tamsulosin HCl  0.4 mg Oral Daily  . trandolapril  4 mg Oral Daily  . DISCONTD: aspirin  81 mg Oral Daily  . DISCONTD: azithromycin  500 mg Intravenous Once   Continuous Infusions:   . sodium chloride 50 mL/hr at 10/04/11 1115  . DISCONTD: sodium chloride 75 mL/hr at 10/04/11 0103   PRN Meds:.HYDROcodone-acetaminophen, iohexol, nitroGLYCERIN, senna, zolpidem   Ht: 5\' 11"  (180.3 cm)  Wt: 154 lb 12.2 oz (70.2 kg)  Ideal Wt: 78 kg % Ideal Wt: 89.5% Wt Readings from Last 10 Encounters:  10/04/11 154 lb 12.2 oz (70.2 kg)  07/19/11 156 lb (70.761 kg)  01/07/11 155 lb (70.308 kg)  12/03/10 156 lb (70.761 kg)  04/13/10 155 lb (70.308 kg)  11/27/09 158 lb 12.8 oz (72.031 kg)  06/30/09 155 lb 8 oz (70.534 kg)  04/09/09 161 lb (73.029  kg)  11/05/08 159 lb (72.122 kg)  10/22/08 158 lb (71.668 kg)  *PT reported unintentional weight los of 10-12 lb over the past 5 years.   Body mass index is 21.59 kg/(m^2). (WNL)  Food/Nutrition Related Hx: Patient denies any problems chewing or swallowing. He reported a good appetite. He reported he has not eaten anything yet today. Patient ordered lunch just prior to RD visit. Patient would like to try Boost nutrition supplement.   Labs:  CMP     Component Value Date/Time   NA 138 10/04/2011 0415   K 4.5 10/04/2011 0415   CL 104 10/04/2011 0415   CO2 24 10/04/2011 0415   GLUCOSE 116* 10/04/2011 0415   BUN 26* 10/04/2011 0415   CREATININE 1.52* 10/04/2011 0415   CALCIUM 8.5 10/04/2011 0415   PROT 6.2 10/04/2011 0415   ALBUMIN 3.2* 10/04/2011 0415   AST 34 10/04/2011 0415   ALT 13 10/04/2011 0415   ALKPHOS 46 10/04/2011 0415   BILITOT 0.6 10/04/2011 0415   GFRNONAA 38* 10/04/2011 0415   GFRAA 44* 10/04/2011 0415    Intake/Output Summary (Last 24 hours) at 10/04/11 1527 Last data filed at 10/04/11 1356  Gross per 24 hour  Intake    240 ml  Output  400 ml  Net   -160 ml     Diet Order: Cardiac  Supplements/Tube Feeding: none at this time.   IVF:    sodium chloride Last Rate: 50 mL/hr at 10/04/11 1115  DISCONTD: sodium chloride Last Rate: 75 mL/hr at 10/04/11 0103    Estimated Nutritional Needs:   Kcal: 1500-1750 Protein: 77-91 grams Fluid: 1 ml per kcal intake  NUTRITION DIAGNOSIS: -Increased nutrient needs, Status Ongoing.   RELATED TO: pt 89.5% of IBW  AS EVIDENCE BY: increased calorie and protein needs to promote weight gain/ prevent further weight loss.   MONITORING/EVALUATION(Goals): PO intake, weights, labs 1. PO intake > 75% at meals and supplements.  2. Promote weight maintenance.   EDUCATION NEEDS: -No education needs identified at this time  INTERVENTION: 1. Will order patient Boost nutrition supplement once daily. Provides 360 kcal and 14 grams of  protein daily.  2. RD to follow for nutrition plan of care.   Dietitian [phone]  DOCUMENTATION CODES Per approved criteria  -Not Applicable    Iven Finn [facility] 10/04/2011, 3:21 PM

## 2011-10-04 NOTE — Progress Notes (Signed)
[facility]                                                                                Patient Demographics  Thomas [name], is a 78 y.o. male  ZOX:[id number]  WJX:[id number]  DOB - 19-Sep-1916  Admit date - 10/03/2011  Admitting Physician Massie Maroon, MD  Outpatient Primary MD for the patient is Nelwyn Salisbury, MD  LOS - 1  Chief complaint-  fever, Chills, cough shortness of       Assessment & Plan    1. Community acquired pneumonia - patient looks and feels much better, blood cultures done in the ER, continue when necessary nebulizer treatment oxygen as needed to keep pulse ox of 90% and continue empiric Avelox.   2. History of CAD, dyslipidemia, hypertension and neuropathy - no acute issues continue home medications, outpatient monitoring and risk factor modulation.     Family Communication: Discussed with patient bedside  Disposition Plan: Likely to home    Procedures CT chest    Consults  none   Antibiotics  Avelox started on 10/04/2011   Time Spent in minutes  35   DVT Prophylaxis  Lovenox    Leroy Sea M.D on 10/04/2011 at 11:11 AM  Between 7am to 7pm - Pager - [phone]  After 7pm go to [email or web address] - password [email or web address]  And look for the night coverage person covering for me after hours  [facility] Office  [phone]    Subjective:   [name] today has, No headache, No chest pain, No abdominal pain - No Nausea, No new weakness tingling or numbness, No Cough - SOB is much improved and he feels close to his baseline.  Objective:   Filed Vitals:   10/03/11 1955 10/04/11 0241 10/04/11 0628 10/04/11 0936  BP: 186/87 147/77 129/62 147/107  Pulse: 96 83 84   Temp: 98.6 F (37 C) 98.7 F (37.1 C) 98.2 F (36.8 C)   TempSrc: Oral Oral Oral   Resp: 22 20 20    Height:  5\' 11"  (1.803 m)    Weight:  70.2 kg (154 lb 12.2 oz)    SpO2: 95% 95% 95%     Wt Readings from Last 3 Encounters:    10/04/11 70.2 kg (154 lb 12.2 oz)  07/19/11 70.761 kg (156 lb)  01/07/11 70.308 kg (155 lb)     Intake/Output Summary (Last 24 hours) at 10/04/11 1111 Last data filed at 10/04/11 1030  Gross per 24 hour  Intake      0 ml  Output    350 ml  Net   -350 ml    Exam Awake Alert, Oriented X 3, No new F.N deficits, Normal affect [place].AT,PERRAL Supple Neck,No JVD, No cervical lymphadenopathy appriciated.  Symmetrical Chest wall movement, Good air movement bilaterally, few rales in the left base RRR,No Gallops,Rubs or new Murmurs, No Parasternal Heave +ve B.Sounds, Abd Soft, Non tender, No organomegaly appriciated, No rebound - guarding or rigidity. No Cyanosis, Clubbing or edema, No new Rash or bruise     Data Review   CBC  Lab 10/04/11 0415 10/03/11 2036  WBC 15.2* 14.9*  HGB 13.5 15.4  HCT 40.0  43.0  PLT 159 201  MCV 93.0 91.9  MCH 31.4 32.9  MCHC 33.8 35.8  RDW 12.8 12.8  LYMPHSABS 0.7 0.7  MONOABS 0.8 0.7  EOSABS 0.0 0.0  BASOSABS 0.0 0.0  BANDABS -- --    Chemistries   Lab 10/04/11 0415 10/03/11 2036  NA 138 137  K 4.5 4.2  CL 104 101  CO2 24 24  GLUCOSE 116* 133*  BUN 26* 26*  CREATININE 1.52* 1.36*  CALCIUM 8.5 9.2  MG -- --  AST 34 --  ALT 13 --  ALKPHOS 46 --  BILITOT 0.6 --   ------------------------------------------------------------------------------------------------------------------ estimated creatinine clearance is 30.1 ml/min (by C-G formula based on Cr of 1.52). ------------------------------------------------------------------------------------------------------------------ No results found for this basename: HGBA1C:2 in the last 72 hours ------------------------------------------------------------------------------------------------------------------ No results found for this basename: CHOL:2,HDL:2,LDLCALC:2,TRIG:2,CHOLHDL:2,LDLDIRECT:2 in the last 72  hours ------------------------------------------------------------------------------------------------------------------ No results found for this basename: TSH,T4TOTAL,FREET3,T3FREE,THYROIDAB in the last 72 hours ------------------------------------------------------------------------------------------------------------------ No results found for this basename: VITAMINB12:2,FOLATE:2,FERRITIN:2,TIBC:2,IRON:2,RETICCTPCT:2 in the last 72 hours  Coagulation profile No results found for this basename: INR:5,PROTIME:5 in the last 168 hours   Basename 10/03/11 2048  DDIMER 1.31*    Cardiac Enzymes No results found for this basename: CK:3,CKMB:3,TROPONINI:3,MYOGLOBIN:3 in the last 168 hours ------------------------------------------------------------------------------------------------------------------ No components found with this basename: POCBNP:3  Micro Results No results found for this or any previous visit (from the past 240 hour(s)).  Radiology Reports Dg Chest 2 View  10/03/2011  *RADIOLOGY REPORT*  Clinical Data: Shortness of breath, weakness, cough and congestion.  CHEST - 2 VIEW  Comparison: 04/13/2010  Findings: Stable chest x-ray with some degree of probable underlying COPD.  Bibasilar scarring present.  No evidence of pulmonary infiltrate, edema or pleural fluid.  Stable tortuosity of the thoracic aorta.  Stable degenerative disease of the spine.  IMPRESSION: No acute process identified.  Stable chronic lung disease.  Original Report Authenticated By: Reola Calkins, M.D.   Ct Angio Chest W/cm &/or Wo Cm  10/03/2011  *RADIOLOGY REPORT*  Clinical Data: Dyspnea.  Chest pain.  CT ANGIOGRAPHY CHEST  Technique:  Multidetector CT imaging of the chest using the standard protocol during bolus administration of intravenous contrast. Multiplanar reconstructed images including MIPs were obtained and reviewed to evaluate the vascular anatomy.  Contrast: OMNIPAQUE IOHEXOL 350 MG/ML  SOLN  Comparison: Chest x-ray 10/03/2011.  Findings: The chest wall is unremarkable.  No supraclavicular or axillary mass or adenopathy.  The thyroid gland is grossly normal. The bony thorax is intact.  No destructive bone changes.  Moderate degenerative changes.  The heart is normal in size.  No pericardial effusion.  No mediastinal or hilar lymphadenopathy.  The esophagus is grossly normal.  The aorta is tortuous, ectatic and demonstrates advanced atherosclerotic change but no findings for aortic dissection or focal aneurysm.  Coronary artery calcifications are noted.  The pulmonary arterial tree is fairly well opacified.  No filling defects to suggest pulmonary embolism.  Examination of the lung parenchyma demonstrates a left lower lobe pneumonia.  Underlying emphysematous changes are noted.  No pleural effusion or pulmonary edema.  No worrisome mass lesions.  IMPRESSION:  1.  Tortuous ectatic thoracic aorta with advanced atherosclerotic changes but no focal aneurysm or dissection. 2.  Left lower lobe pneumonia. 3.  Emphysema.  Original Report Authenticated By: P. Loralie Champagne, M.D.    Scheduled Meds:   . aspirin  81 mg Oral Daily  . cefTRIAXone (ROCEPHIN)  IV  1 g Intravenous Once  . clopidogrel  75  mg Oral Daily  . diltiazem  240 mg Oral Daily  . enoxaparin (LOVENOX) injection  40 mg Subcutaneous Q24H  . fluticasone  2 spray Each Nare Daily  .  HYDROmorphone (DILAUDID) injection  0.5 mg Intravenous Once  . l-methylfolate-B6-B12  1 tablet Oral Daily  . moxifloxacin  400 mg Intravenous Q24H  . ondansetron (ZOFRAN) IV  4 mg Intravenous Once  . pneumococcal 23 valent vaccine  0.5 mL Intramuscular Tomorrow-1000  . Tamsulosin HCl  0.4 mg Oral Daily  . trandolapril  4 mg Oral Daily  . DISCONTD: aspirin  81 mg Oral Daily  . DISCONTD: azithromycin  500 mg Intravenous Once   Continuous Infusions:   . sodium chloride    . DISCONTD: sodium chloride 75 mL/hr at 10/04/11 0103   PRN  Meds:.HYDROcodone-acetaminophen, iohexol, nitroGLYCERIN, senna, zolpidem

## 2011-10-04 NOTE — Progress Notes (Signed)
MD was notified of patients HR dropping in the 40's earlier while he was asleep. Plans are to decrease his diltalizem to 180 daily. Will continue to monitor patient. Setzer, Don Broach

## 2011-10-04 NOTE — H&P (Addendum)
Thomas [name] is an 78 y.o. male.     Pcp: Shellia Carwin  Chief Complaint:  HPI: 78 yo male with hx of htn, hyperlipidemia, cad, mild AS, neuropathy c/o chills around 4pm yesterday afternoon.  Came to ER for evaluation.    In ED, cxr=>negative for any acute process.  CTA chest=>negative for disection and + for L lower lung pneumonia.  + fatigue,   Denies fever, cp, palp, sob, n/v, diarrhea.   Pt will be admitted for pneumonia.   Past Medical History  Diagnosis Date  . Mild aortic stenosis     last echo 08/2006  . Coronary artery disease     nonobstructive  . Myocardial infarction   . Hyperlipidemia   . Hypertension   . Vitamin B12 deficiency   . Neuropathy   . Allergic rhinitis   . OA (osteoarthritis)   . History of bladder cancer   . GERD (gastroesophageal reflux disease)   . Esophageal stricture     Past Surgical History  Procedure Date  . Coronary angioplasty   . Appendectomy   . Cholecystectomy   . Tonsillectomy   . Sigmoidoscopy 1999  . Colonoscopy   . Bilateral elbow surgery   . Left knee replacement   . Carotid endarterectomy 08/2008  . Esophagogastroduodenoscopy     dilation    Family History  Problem Relation Age of Onset  . Coronary artery disease Brother   . Prostate cancer      first degree relative   Social History:  reports that he quit smoking about 61 years ago. He has never used smokeless tobacco. He reports that he drinks alcohol. He reports that he does not use illicit drugs.  Allergies:  Allergies  Allergen Reactions  . Sulfamethoxazole     REACTION: unspecified     (Not in a hospital admission)  Results for orders placed during the hospital encounter of 10/03/11 (from the past 48 hour(s))  CBC WITH DIFFERENTIAL     Status: Abnormal   Collection Time   10/03/11  8:36 PM      Component Value Range Comment   WBC 14.9 (*) 4.0 - 10.5 K/uL    RBC 4.68  4.22 - 5.81 MIL/uL    Hemoglobin 15.4  13.0 - 17.0 g/dL    HCT 16.1  09.6 - 04.5 %    MCV 91.9  78.0 - 100.0 fL    MCH 32.9  26.0 - 34.0 pg    MCHC 35.8  30.0 - 36.0 g/dL    RDW 40.9  81.1 - 91.4 %    Platelets 201  150 - 400 K/uL    Neutrophils Relative 90 (*) 43 - 77 %    Lymphocytes Relative 5 (*) 12 - 46 %    Monocytes Relative 5  3 - 12 %    Eosinophils Relative 0  0 - 5 %    Basophils Relative 0  0 - 1 %    Neutro Abs 13.5 (*) 1.7 - 7.7 K/uL    Lymphs Abs 0.7  0.7 - 4.0 K/uL    Monocytes Absolute 0.7  0.1 - 1.0 K/uL    Eosinophils Absolute 0.0  0.0 - 0.7 K/uL    Basophils Absolute 0.0  0.0 - 0.1 K/uL    Smear Review MORPHOLOGY UNREMARKABLE     BASIC METABOLIC PANEL     Status: Abnormal   Collection Time   10/03/11  8:36 PM      Component Value Range Comment  Sodium 137  135 - 145 mEq/L    Potassium 4.2  3.5 - 5.1 mEq/L    Chloride 101  96 - 112 mEq/L    CO2 24  19 - 32 mEq/L    Glucose, Bld 133 (*) 70 - 99 mg/dL    BUN 26 (*) 6 - 23 mg/dL    Creatinine, Ser 1.61 (*) 0.50 - 1.35 mg/dL    Calcium 9.2  8.4 - 09.6 mg/dL    GFR calc non Af Amer 43 (*) >90 mL/min    GFR calc Af Amer 50 (*) >90 mL/min   D-DIMER, QUANTITATIVE     Status: Abnormal   Collection Time   10/03/11  8:48 PM      Component Value Range Comment   D-Dimer, Quant 1.31 (*) 0.00 - 0.48 ug/mL-FEU   LIPASE, BLOOD     Status: Normal   Collection Time   10/03/11  8:48 PM      Component Value Range Comment   Lipase 26  11 - 59 U/L   URINALYSIS, ROUTINE W REFLEX MICROSCOPIC     Status: Normal   Collection Time   10/03/11  8:49 PM      Component Value Range Comment   Color, Urine YELLOW  YELLOW    APPearance CLEAR  CLEAR    Specific Gravity, Urine 1.014  1.005 - 1.030    pH 6.0  5.0 - 8.0    Glucose, UA NEGATIVE  NEGATIVE mg/dL    Hgb urine dipstick NEGATIVE  NEGATIVE    Bilirubin Urine NEGATIVE  NEGATIVE    Ketones, ur NEGATIVE  NEGATIVE mg/dL    Protein, ur NEGATIVE  NEGATIVE mg/dL    Urobilinogen, UA 0.2  0.0 - 1.0 mg/dL    Nitrite NEGATIVE  NEGATIVE    Leukocytes, UA NEGATIVE   NEGATIVE MICROSCOPIC NOT DONE ON URINES WITH NEGATIVE PROTEIN, BLOOD, LEUKOCYTES, NITRITE, OR GLUCOSE <1000 mg/dL.   Dg Chest 2 View  10/03/2011  *RADIOLOGY REPORT*  Clinical Data: Shortness of breath, weakness, cough and congestion.  CHEST - 2 VIEW  Comparison: 04/13/2010  Findings: Stable chest x-ray with some degree of probable underlying COPD.  Bibasilar scarring present.  No evidence of pulmonary infiltrate, edema or pleural fluid.  Stable tortuosity of the thoracic aorta.  Stable degenerative disease of the spine.  IMPRESSION: No acute process identified.  Stable chronic lung disease.  Original Report Authenticated By: Reola Calkins, M.D.   Ct Angio Chest W/cm &/or Wo Cm  10/03/2011  *RADIOLOGY REPORT*  Clinical Data: Dyspnea.  Chest pain.  CT ANGIOGRAPHY CHEST  Technique:  Multidetector CT imaging of the chest using the standard protocol during bolus administration of intravenous contrast. Multiplanar reconstructed images including MIPs were obtained and reviewed to evaluate the vascular anatomy.  Contrast: OMNIPAQUE IOHEXOL 350 MG/ML SOLN  Comparison: Chest x-ray 10/03/2011.  Findings: The chest wall is unremarkable.  No supraclavicular or axillary mass or adenopathy.  The thyroid gland is grossly normal. The bony thorax is intact.  No destructive bone changes.  Moderate degenerative changes.  The heart is normal in size.  No pericardial effusion.  No mediastinal or hilar lymphadenopathy.  The esophagus is grossly normal.  The aorta is tortuous, ectatic and demonstrates advanced atherosclerotic change but no findings for aortic dissection or focal aneurysm.  Coronary artery calcifications are noted.  The pulmonary arterial tree is fairly well opacified.  No filling defects to suggest pulmonary embolism.  Examination of the lung parenchyma demonstrates  a left lower lobe pneumonia.  Underlying emphysematous changes are noted.  No pleural effusion or pulmonary edema.  No worrisome mass lesions.   IMPRESSION:  1.  Tortuous ectatic thoracic aorta with advanced atherosclerotic changes but no focal aneurysm or dissection. 2.  Left lower lobe pneumonia. 3.  Emphysema.  Original Report Authenticated By: P. Loralie Champagne, M.D.    Review of Systems  Constitutional: Positive for chills. Negative for fever, weight loss, malaise/fatigue and diaphoresis.  HENT: Negative for hearing loss, ear pain, nosebleeds, congestion, neck pain, tinnitus and ear discharge.   Eyes: Negative for blurred vision, double vision, photophobia, pain, discharge and redness.  Respiratory: Positive for cough. Negative for hemoptysis, sputum production, shortness of breath, wheezing and stridor.        Clear phlegm  Cardiovascular: Negative for chest pain, palpitations, orthopnea, claudication, leg swelling and PND.  Gastrointestinal: Negative for heartburn, nausea, vomiting, abdominal pain, diarrhea, constipation, blood in stool and melena.  Genitourinary: Negative for dysuria, urgency, frequency, hematuria and flank pain.  Musculoskeletal: Negative for myalgias, back pain, joint pain and falls.  Skin: Negative for itching and rash.  Neurological: Negative for dizziness, tingling, tremors, sensory change, speech change, focal weakness, seizures, loss of consciousness, weakness and headaches.  Endo/Heme/Allergies: Negative for environmental allergies and polydipsia. Does not bruise/bleed easily.  Psychiatric/Behavioral: Negative for depression, suicidal ideas, hallucinations, memory loss and substance abuse. The patient is not nervous/anxious and does not have insomnia.     Blood pressure 186/87, pulse 96, temperature 98.6 F (37 C), temperature source Oral, resp. rate 22, SpO2 95.00%. Physical Exam  Constitutional: He is oriented to person, place, and time. He appears well-developed and well-nourished. No distress.  HENT:  Head: Normocephalic and atraumatic.  Mouth/Throat: No oropharyngeal exudate.  Eyes: EOM are  normal. Pupils are equal, round, and reactive to light. Right eye exhibits no discharge. Left eye exhibits no discharge. No scleral icterus.  Neck: Normal range of motion. Neck supple. No JVD present. No tracheal deviation present. No thyromegaly present.  Cardiovascular: Normal rate and regular rhythm.  Exam reveals no gallop and no friction rub.   Murmur heard. Respiratory: Effort normal. No stridor. No respiratory distress. He has no wheezes. He has rales. He exhibits no tenderness.  GI: Soft. Bowel sounds are normal. He exhibits no distension and no mass. There is no tenderness. There is no rebound and no guarding.  Musculoskeletal: Normal range of motion. He exhibits no edema and no tenderness.  Lymphadenopathy:    He has no cervical adenopathy.  Neurological: He is alert and oriented to person, place, and time. He has normal reflexes. He displays normal reflexes. No cranial nerve deficit. He exhibits normal muscle tone. Coordination normal.  Skin: Skin is warm and dry. No rash noted. He is not diaphoretic. No erythema. No pallor.  Psychiatric: He has a normal mood and affect. His behavior is normal. Judgment and thought content normal.     Assessment/Plan Pneumonia Check blood cx x 2 sets, start on avelox 400mg  iv qday for pneumonia Hypertension: cont diltizaem, mavik Carotid stenosis: cont aspirin, plavix.  Neuropathy:  Start on metanx.  Bph: cont flomax  Pearson Grippe 10/04/2011, 12:28 AM

## 2011-10-05 DIAGNOSIS — R1310 Dysphagia, unspecified: Secondary | ICD-10-CM

## 2011-10-05 DIAGNOSIS — R42 Dizziness and giddiness: Secondary | ICD-10-CM

## 2011-10-05 LAB — CBC
HCT: 34.6 % — ABNORMAL LOW (ref 39.0–52.0)
Hemoglobin: 11.6 g/dL — ABNORMAL LOW (ref 13.0–17.0)
MCV: 94 fL (ref 78.0–100.0)
RBC: 3.68 MIL/uL — ABNORMAL LOW (ref 4.22–5.81)
WBC: 9.4 10*3/uL (ref 4.0–10.5)

## 2011-10-05 LAB — BASIC METABOLIC PANEL
BUN: 29 mg/dL — ABNORMAL HIGH (ref 6–23)
CO2: 25 mEq/L (ref 19–32)
Chloride: 107 mEq/L (ref 96–112)
Creatinine, Ser: 1.66 mg/dL — ABNORMAL HIGH (ref 0.50–1.35)
Potassium: 3.9 mEq/L (ref 3.5–5.1)

## 2011-10-05 LAB — EXPECTORATED SPUTUM ASSESSMENT W GRAM STAIN, RFLX TO RESP C

## 2011-10-05 MED ORDER — GUAIFENESIN 100 MG/5ML PO SOLN
200.0000 mg | ORAL | Status: DC | PRN
  Administered 2011-10-05 (×2): 200 mg via ORAL
  Filled 2011-10-05: qty 10
  Filled 2011-10-05: qty 118
  Filled 2011-10-05: qty 10

## 2011-10-05 MED ORDER — ENOXAPARIN SODIUM 30 MG/0.3ML ~~LOC~~ SOLN
30.0000 mg | SUBCUTANEOUS | Status: DC
  Administered 2011-10-05: 30 mg via SUBCUTANEOUS
  Filled 2011-10-05 (×2): qty 0.3

## 2011-10-05 MED ORDER — MOXIFLOXACIN HCL 400 MG PO TABS
400.0000 mg | ORAL_TABLET | Freq: Every day | ORAL | Status: DC
  Administered 2011-10-05: 400 mg via ORAL
  Filled 2011-10-05 (×2): qty 1

## 2011-10-05 NOTE — Evaluation (Signed)
Physical Therapy Evaluation Patient Details Name: Thomas [name] MRN: [id number] DOB: [birth year] Today's Date: 10/05/2011 Time: 4696-2952 PT Time Calculation (min): 22 min  PT Assessment / Plan / Recommendation Clinical Impression  Pt admitted for community acquired pneumonia.  Pt SaO2 dropped to 86% on room air during ambulation. Pt reports at baseline he is modified independent with straight cane.  Pt would benefit from acute PT services in order to improve independence with transfers and ambulation to prepare for d/c home with spouse.  Spouse into room upon exiting and reports she would be unable to assist him at home.  Pt currently minA for bed mobility and sit to stand due to weakness and posterior lean with stand.  Will likely progress to HHPT.    PT Assessment  Patient needs continued PT services    Follow Up Recommendations  Home health PT;Supervision for mobility/OOB    Barriers to Discharge Decreased caregiver support      Equipment Recommendations  None recommended by PT    Recommendations for Other Services     Frequency Min 3X/week    Precautions / Restrictions Precautions Precautions: Fall Restrictions Weight Bearing Restrictions: No   Pertinent Vitals/Pain No pain  SATURATION QUALIFICATIONS:  Patient Saturations on Room Air at Rest = 90%  Patient Saturations on Room Air while Ambulating = 86%  Patient Saturations on 2 Liters of oxygen while Ambulating = 92%  Pt ambulated on room air and dropped to 86%, so pt required 2L to increase to 92%.  Pt given verbal cues for breathing in through nose (pt tends to mouth breathe)      Mobility  Bed Mobility Bed Mobility: Rolling Left;Left Sidelying to Sit Rolling Left: 5: Supervision Left Sidelying to Sit: 4: Min assist;HOB flat Supine to Sit: 5: Supervision;HOB flat Details for Bed Mobility Assistance: verbal cues for technique, assist for trunk upright Transfers Transfers: Stand to Sit;Sit to Stand Sit to  Stand: 4: Min assist;From bed;From elevated surface Stand to Sit: 4: Min assist;To bed;To elevated surface Details for Transfer Assistance: assist to rise and steady, posterior lean observed upon standing and pt reported feeling unsteady, waited for pt to maintain balance prior to ambulation Ambulation/Gait Ambulation/Gait Assistance: 4: Min guard Ambulation Distance (Feet): 100 Feet Assistive device: Rolling walker Ambulation/Gait Assistance Details: pt SaO2 dropped to 86% on room air so 2L [place] applied, verbal cues for negotiating RW Gait Pattern: Trunk flexed;Step-through pattern Gait velocity: decreased    Exercises     PT Diagnosis: Difficulty walking;Generalized weakness  PT Problem List: Decreased strength;Decreased activity tolerance;Decreased mobility;Decreased balance;Decreased knowledge of use of DME;Cardiopulmonary status limiting activity PT Treatment Interventions: DME instruction;Gait training;Stair training;Functional mobility training;Therapeutic activities;Therapeutic exercise;Balance training;Neuromuscular re-education;Patient/family education   PT Goals Acute Rehab PT Goals PT Goal Formulation: With patient Time For Goal Achievement: 10/12/11 Potential to Achieve Goals: Good Pt will go Supine/Side to Sit: with modified independence PT Goal: Supine/Side to Sit - Progress: Goal set today Pt will go Sit to Stand: with modified independence PT Goal: Sit to Stand - Progress: Goal set today Pt will go Stand to Sit: with modified independence PT Goal: Stand to Sit - Progress: Goal set today Pt will Ambulate: >150 feet;with supervision;with least restrictive assistive device;Other (comment) (SaO2 >92%) PT Goal: Ambulate - Progress: Goal set today  Visit Information  Last PT Received On: 10/05/11 Assistance Needed: +1    Subjective Data  Subjective: I'm just tired.   Prior Functioning  Home Living Lives With: Spouse Type of Home: House  Home Access: Stairs to  enter Entrance Stairs-Number of Steps: 3 Entrance Stairs-Rails: Can reach both Home Layout: Two level;Able to live on main level with bedroom/bathroom Home Adaptive Equipment: Straight cane;Walker - rolling Prior Function Level of Independence: Independent with assistive device(s) Comments: Pt reports he usually uses cane for ambulation. Communication Communication: HOH    Cognition  Overall Cognitive Status: Appears within functional limits for tasks assessed/performed Arousal/Alertness: Awake/alert Orientation Level: Appears intact for tasks assessed Behavior During Session: [facility] for tasks performed    Extremity/Trunk Assessment Right Upper Extremity Assessment RUE ROM/Strength/Tone: [facility] for tasks assessed Left Upper Extremity Assessment LUE ROM/Strength/Tone: WFL for tasks assessed Right Lower Extremity Assessment RLE ROM/Strength/Tone: Deficits RLE ROM/Strength/Tone Deficits: grossly 3+/5 throughout per functional observation, 2+/5 for hip flexion due to posterior lean Left Lower Extremity Assessment LLE ROM/Strength/Tone: Deficits LLE ROM/Strength/Tone Deficits: grossly 3+/5 throughout per functional observation, 2+/5 for hip flexion due to posterior lean   Balance    End of Session PT - End of Session Equipment Utilized During Treatment: Gait belt;Oxygen Activity Tolerance: Patient limited by fatigue Patient left: in bed;with call bell/phone within reach;with bed alarm set;with family/visitor present Nurse Communication: Mobility status (nsg tech assisted with pulse ox during ambulation)  GP     Travanti Mcmanus,KATHrine E 10/05/2011, 11:47 AM Pager: [phone]

## 2011-10-05 NOTE — Progress Notes (Signed)
Patient seen, examined and discussed with my nurse practitioner. His oxygen saturations dropped while on room air. Seen by physical therapy and recommending home health. We'll work on setting up home oxygen and physical therapy. Possible discharge later on today.

## 2011-10-05 NOTE — Progress Notes (Signed)
[facility] PROGRESS NOTE  Thomas [name] WUJ:[id number] DOB: [birth year] DOA: 10/03/2011 PCP: Nelwyn Salisbury, MD  Assessment/Plan: Active Problems:    HYPERTENSION controlled. Continue home meds. Monitor   CORONARY ARTERY DISEASE: no CP. Continue home meds. Monitor on tele   Community acquired pneumonia: improved. Afebrile, WC within normal limits, blood cultures neg to date. Avelox day #2. Will change to po. Will continue nebs as needed  Will trial sats on room air as are >90 on 2L.   Acute renal insufficiency : Likely related to decrease po intake over last couple of days. Will encourage po fluids, hold any nephrotoxins. Recheck in am or if discharged this afternoon close OP follow up with dr. Clent Ridges. Chart review indicates baseline range 1.1-1.4.   Code Status:  Family Communication: patient at bedside Disposition Plan: likely home   Brief narrative: 77 yo male with hx of htn, hyperlipidemia, cad, mild AS, neuropathy c/o chills , fever weakness cough Came to ER for evaluation and admitted for CAP 10/04/11   Consultants:    Procedures:    Antibiotics:  avelox  HPI/Subjective: Lying in bed eyes closed. Easily aroused. Denies pain/discomfort. NAD  Objective: Filed Vitals:   10/04/11 0936 10/04/11 1355 10/04/11 2209 10/05/11 0616  BP: 147/107 138/80 145/77 109/63  Pulse:  76 77 55  Temp:  97.5 F (36.4 C) 98.1 F (36.7 C) 98.3 F (36.8 C)  TempSrc:  Oral Oral Oral  Resp:  20 20 18   Height:      Weight:      SpO2:  94% 95% 94%    Intake/Output Summary (Last 24 hours) at 10/05/11 1108 Last data filed at 10/05/11 0630  Gross per 24 hour  Intake 1682.5 ml  Output    810 ml  Net  872.5 ml    Exam:   General:  Awake alert oriented x3 NAD  Cardiovascular: RRR no MGR no LEE  Respiratory: normal effort but somewhat shallow and distant. BSCTAB no wheeze or rhonchi  Abdomen: soft +BS non-tender to palp  Data Reviewed: Basic Metabolic Panel:  Lab  10/05/11 0404 10/04/11 0415 10/03/11 2036  NA 138 138 137  K 3.9 4.5 4.2  CL 107 104 101  CO2 25 24 24   GLUCOSE 107* 116* 133*  BUN 29* 26* 26*  CREATININE 1.66* 1.52* 1.36*  CALCIUM 8.4 8.5 9.2  MG -- -- --  PHOS -- -- --   Liver Function Tests:  Lab 10/04/11 0415  AST 34  ALT 13  ALKPHOS 46  BILITOT 0.6  PROT 6.2  ALBUMIN 3.2*    Lab 10/03/11 2048  LIPASE 26  AMYLASE --   No results found for this basename: AMMONIA:5 in the last 168 hours CBC:  Lab 10/05/11 0404 10/04/11 0415 10/03/11 2036  WBC 9.4 15.2* 14.9*  NEUTROABS -- 13.7* 13.5*  HGB 11.6* 13.5 15.4  HCT 34.6* 40.0 43.0  MCV 94.0 93.0 91.9  PLT 147* 159 201   Cardiac Enzymes: No results found for this basename: CKTOTAL:5,CKMB:5,CKMBINDEX:5,TROPONINI:5 in the last 168 hours BNP (last 3 results) No results found for this basename: PROBNP:3 in the last 8760 hours CBG: No results found for this basename: GLUCAP:5 in the last 168 hours  Recent Results (from the past 240 hour(s))  CULTURE, BLOOD (ROUTINE X 2)     Status: Normal (Preliminary result)   Collection Time   10/04/11  4:15 AM      Component Value Range Status Comment   Specimen Description BLOOD R ARM  Final    Special Requests Normal BOTTLES DRAWN AEROBIC AND ANAEROBIC 5CC   Final    Culture  Setup Time 10/04/2011 08:51   Final    Culture     Final    Value:        BLOOD CULTURE RECEIVED NO GROWTH TO DATE CULTURE WILL BE HELD FOR 5 DAYS BEFORE ISSUING A FINAL NEGATIVE REPORT   Report Status PENDING   Incomplete   CULTURE, BLOOD (ROUTINE X 2)     Status: Normal (Preliminary result)   Collection Time   10/04/11  4:20 AM      Component Value Range Status Comment   Specimen Description BLOOD R HAND   Final    Special Requests BOTTLES DRAWN AEROBIC AND ANAEROBIC 5CC   Final    Culture  Setup Time 10/04/2011 08:51   Final    Culture     Final    Value:        BLOOD CULTURE RECEIVED NO GROWTH TO DATE CULTURE WILL BE HELD FOR 5 DAYS BEFORE ISSUING  A FINAL NEGATIVE REPORT   Report Status PENDING   Incomplete      Studies: Dg Chest 2 View  10/03/2011  *RADIOLOGY REPORT*  Clinical Data: Shortness of breath, weakness, cough and congestion.  CHEST - 2 VIEW  Comparison: 04/13/2010  Findings: Stable chest x-ray with some degree of probable underlying COPD.  Bibasilar scarring present.  No evidence of pulmonary infiltrate, edema or pleural fluid.  Stable tortuosity of the thoracic aorta.  Stable degenerative disease of the spine.  IMPRESSION: No acute process identified.  Stable chronic lung disease.  Original Report Authenticated By: Reola Calkins, M.D.   Ct Angio Chest W/cm &/or Wo Cm  10/03/2011  *RADIOLOGY REPORT*  Clinical Data: Dyspnea.  Chest pain.  CT ANGIOGRAPHY CHEST  Technique:  Multidetector CT imaging of the chest using the standard protocol during bolus administration of intravenous contrast. Multiplanar reconstructed images including MIPs were obtained and reviewed to evaluate the vascular anatomy.  Contrast: OMNIPAQUE IOHEXOL 350 MG/ML SOLN  Comparison: Chest x-ray 10/03/2011.  Findings: The chest wall is unremarkable.  No supraclavicular or axillary mass or adenopathy.  The thyroid gland is grossly normal. The bony thorax is intact.  No destructive bone changes.  Moderate degenerative changes.  The heart is normal in size.  No pericardial effusion.  No mediastinal or hilar lymphadenopathy.  The esophagus is grossly normal.  The aorta is tortuous, ectatic and demonstrates advanced atherosclerotic change but no findings for aortic dissection or focal aneurysm.  Coronary artery calcifications are noted.  The pulmonary arterial tree is fairly well opacified.  No filling defects to suggest pulmonary embolism.  Examination of the lung parenchyma demonstrates a left lower lobe pneumonia.  Underlying emphysematous changes are noted.  No pleural effusion or pulmonary edema.  No worrisome mass lesions.  IMPRESSION:  1.  Tortuous ectatic  thoracic aorta with advanced atherosclerotic changes but no focal aneurysm or dissection. 2.  Left lower lobe pneumonia. 3.  Emphysema.  Original Report Authenticated By: P. Loralie Champagne, M.D.    Scheduled Meds:   . aspirin  81 mg Oral Daily  . clopidogrel  75 mg Oral Daily  . diltiazem  180 mg Oral Daily  . enoxaparin (LOVENOX) injection  40 mg Subcutaneous Q24H  . fluticasone  2 spray Each Nare Daily  . l-methylfolate-B6-B12  1 tablet Oral Daily  . lactose free nutrition  237 mL Oral Q1500  . moxifloxacin  400 mg Intravenous Q24H  . pneumococcal 23 valent vaccine  0.5 mL Intramuscular Tomorrow-1000  . Tamsulosin HCl  0.4 mg Oral Daily  . trandolapril  4 mg Oral Daily  . DISCONTD: diltiazem  240 mg Oral Daily   Continuous Infusions:   . sodium chloride 50 mL/hr at 10/04/11 1115  . DISCONTD: sodium chloride 75 mL/hr at 10/04/11 0103    Active Problems:  VITAMIN B12 DEFICIENCY  HYPERLIPIDEMIA  NEUROPATHY  HYPERTENSION  CORONARY ARTERY DISEASE  Community acquired pneumonia    Time spent: 35 minutes    [facility] M  [facility] Pager [phone] If 8PM-8AM, please contact night-coverage at [email or web address], password [facility] 10/05/2011, 11:08 AM  LOS: 2 days

## 2011-10-05 NOTE — Clinical Documentation Improvement (Signed)
RENAL FAILURE DOCUMENTATION CLARIFICATION QUERY  THIS DOCUMENT IS NOT A PERMANENT PART OF THE MEDICAL RECORD  TO RESPOND TO THE THIS QUERY, FOLLOW THE INSTRUCTIONS BELOW:  1. If needed, update documentation for the patient's encounter via the notes activity.  2. Access this query again and click edit on the In [employer].  3. After updating, or not, click F2 to complete all highlighted (required) fields concerning your review. Select "additional documentation in the medical record" OR "no additional documentation provided".  4. Click Sign note button.  5. The deficiency will fall out of your In Basket *Please let us know if you are not able to complete this workflow by phone or e-mail (listed below).  Please update your documentation within the medical record to reflect your response to this query.                                                                                    10/05/11  Dear Dr. [facility],  In a better effort to capture your patient's severity of illness, reflect appropriate length of stay and utilization of resources, a review of the patient medical record has revealed the following indicators.    Based on your clinical judgment, please clarify and document in a progress note and/or discharge summary the clinical condition associated with the following supporting information:  In responding to this query please exercise your independent judgment.  The fact that a query is asked, does not imply that any particular answer is desired or expected.  10/05/11 prog note per NP.Marland KitchenMarland Kitchen"Acute renal insufficiency." For accurate Dx specificity & severity can noted "Acute renal insufficiency" be further clarified/specified w/ cond being mon'd, eval'd & tx'd. Thank you   Possible Clinical Conditions?  __Acute Renal Failure __Acute Kidney Injury __Acute Tubular Necrosis __Acute Renal Cortical Necrosis __Acute Renal Medullary Necrosis __Acute on Chronic Renal  Failure __Chronic Renal Failure __Anuria __Oliguria __Other Condition ___Cannot Clinically Determine   Supporting Information:  Risk Factors: see note below  Signs and Symptoms: see note and labs below  Diagnostics: 10/05/11 0404        10/04/11 0415        10/03/11 2036  NA 138                              138                         137  K 3.9                                  4.5                          4.2  CL 107                               104  101  CO2 25                              24                           24   GLUCOSE 107*               116*                       133*  BUN 29*                            26*                          26*  CREATININE 1.66*          1.52*                     1.36*   Treatments: 10/05/11 prog note per NP.Marland KitchenMarland Kitchen"Acute renal insufficiency : Likely related to decrease po intake over last couple of days. Will encourage po fluids, hold any nephrotoxins. Recheck in am or if discharged this afternoon close OP follow up with dr. Clent Ridges. Chart review indicates baseline range 1.1-1.4."              You may use possible, probable, or suspect with inpatient documentation. possible, probable, suspected diagnoses MUST be documented at the time of discharge  Reviewed: additional documentation in the medical record  Thank You,  Toribio Harbour, RN, BSN, CCDS Certified Clinical Documentation Specialist Pager: [phone]  Health Information Management [place]

## 2011-10-06 DIAGNOSIS — I2510 Atherosclerotic heart disease of native coronary artery without angina pectoris: Secondary | ICD-10-CM

## 2011-10-06 DIAGNOSIS — N189 Chronic kidney disease, unspecified: Secondary | ICD-10-CM | POA: Diagnosis not present

## 2011-10-06 DIAGNOSIS — J209 Acute bronchitis, unspecified: Secondary | ICD-10-CM

## 2011-10-06 DIAGNOSIS — N179 Acute kidney failure, unspecified: Secondary | ICD-10-CM | POA: Diagnosis not present

## 2011-10-06 LAB — BASIC METABOLIC PANEL
CO2: 26 mEq/L (ref 19–32)
Calcium: 8.9 mg/dL (ref 8.4–10.5)
Chloride: 104 mEq/L (ref 96–112)
Creatinine, Ser: 1.44 mg/dL — ABNORMAL HIGH (ref 0.50–1.35)
Glucose, Bld: 107 mg/dL — ABNORMAL HIGH (ref 70–99)

## 2011-10-06 MED ORDER — DILTIAZEM HCL ER COATED BEADS 180 MG PO CP24: 180.0000 mg | ORAL_CAPSULE | Freq: Every day | ORAL | Status: DC

## 2011-10-06 MED ORDER — GUAIFENESIN 100 MG/5ML PO SOLN: 200.0000 mg | ORAL | Status: DC | PRN

## 2011-10-06 MED ORDER — POLYETHYLENE GLYCOL 3350 17 G PO PACK
17.0000 g | PACK | Freq: Every day | ORAL | Status: DC
  Filled 2011-10-06: qty 1

## 2011-10-06 MED ORDER — MOXIFLOXACIN HCL 400 MG PO TABS: 400.0000 mg | ORAL_TABLET | Freq: Every day | ORAL | Status: AC

## 2011-10-06 MED ORDER — SENNA-DOCUSATE SODIUM 8.6-50 MG PO TABS: 1.0000 | ORAL_TABLET | Freq: Every day | ORAL | Status: DC

## 2011-10-06 MED ORDER — BOOST PLUS PO LIQD: 237.0000 mL | Freq: Every day | ORAL | Status: DC

## 2011-10-06 MED ORDER — POLYETHYLENE GLYCOL 3350 17 G PO PACK: 17.0000 g | PACK | Freq: Every day | ORAL | Status: AC

## 2011-10-06 MED ORDER — L-METHYLFOLATE-B6-B12 3-35-2 MG PO TABS: 1.0000 | ORAL_TABLET | Freq: Every day | ORAL | Status: DC

## 2011-10-06 NOTE — Progress Notes (Signed)
Private sitter list given to the daughter and spouse for additional support at home. Patient is competent and does not want to go to a SNF and desires to return home with home health care services provided by [facility] as prior to admission. Patient also requires a hospital bed; Per Attending MD, Dr Rito Ehrlich, patient suffers from COPD and has trouble breathing at night when head of bed is elevated less than 35 degrees. Bed wedges do not provide enough elevation to resolve the breathing issues. SOB cause patient to require frequent changes in body positions which cannot be achieved with a normal bed;  Darrian with [facility] called for hospital bed to be delivered to the patient's home today; B Lobbyist, BSN, [place].

## 2011-10-06 NOTE — Progress Notes (Signed)
Physical Therapy Treatment Patient Details Name: Thomas [name] MRN: [id number] DOB: 13-Jun-1916 Today's Date: 10/06/2011 Time: 4098-1191 PT Time Calculation (min): 24 min  PT Assessment / Plan / Recommendation Comments on Treatment Session  Pt ambulated in hallway and currently min/guard assist.  Pt did well with mobility.  Recommended BSC to pt and family to ease with toilet transfers.  Spouse reports she will not be able to physically assist at home.    Follow Up Recommendations  Home health PT;Supervision for mobility/OOB    Barriers to Discharge        Equipment Recommendations  3 in 1 bedside comode    Recommendations for Other Services    Frequency     Plan Discharge plan remains appropriate;Frequency remains appropriate    Precautions / Restrictions Precautions Precautions: Fall   Pertinent Vitals/Pain No pain    Mobility  Bed Mobility Rolling Left: 5: Supervision Left Sidelying to Sit: 5: Supervision Supine to Sit: 5: Supervision Details for Bed Mobility Assistance: pt with flat bed and required increased time and effort Transfers Transfers: Stand to Sit;Sit to Stand Sit to Stand: 4: Min guard;With upper extremity assist Stand to Sit: 4: Min guard;With upper extremity assist Details for Transfer Assistance: verbal cues for safe technique, pt steady today Ambulation/Gait Ambulation/Gait Assistance: 4: Min guard Ambulation Distance (Feet): 120 Feet Assistive device: Rolling walker Ambulation/Gait Assistance Details: pt SaO2 96% on 1L, dropped to 86% on 1L with ambulation so pt given visual and verbal cues for pursed lip breathing and increased back to 91%, SaO2 93% on 1L after ambulation, verbal cues for safe use of RW Gait Pattern: Step-through pattern;Decreased stride length;Trunk flexed Gait velocity: decreased    Exercises     PT Diagnosis:    PT Problem List:   PT Treatment Interventions:     PT Goals Acute Rehab PT Goals PT Goal: Supine/Side to Sit -  Progress: Progressing toward goal PT Goal: Sit to Stand - Progress: Progressing toward goal PT Goal: Stand to Sit - Progress: Progressing toward goal PT Goal: Ambulate - Progress: Progressing toward goal  Visit Information  Last PT Received On: 10/06/11 Assistance Needed: +1    Subjective Data  Subjective: "how are you feeling today?"  "oh don't ask me that, nothing is going as planned"   Cognition  Overall Cognitive Status: Appears within functional limits for tasks assessed/performed    Balance     End of Session PT - End of Session Equipment Utilized During Treatment: Gait belt Activity Tolerance: Patient tolerated treatment well Patient left: in bed;with call bell/phone within reach;with family/visitor present   GP     Itzell Bendavid,KATHrine E 10/06/2011, 1:52 PM Pager: [phone]

## 2011-10-06 NOTE — Progress Notes (Signed)
Talked to patient about DCP/ HHC; Patient lives at home with spouse, is active with [facility] for home health care services. Mary with [facility] called for resumption of services; Home 02 needed- [facility] to provide DME/ Norberta Keens RN with [facility] called for delivery of home 02; Abelino Derrick RN,BSN,MHA

## 2011-10-06 NOTE — Discharge Summary (Addendum)
Physician Discharge Summary  Thomas [name]:[id number] DOB: Oct 04, 1916 DOA: 10/03/2011  PCP: Nelwyn Salisbury, MD  Admit date: 10/03/2011 Discharge date: 10/06/2011  Recommendations for Outpatient Follow-up:  1. Appointment with Dr. Clent Ridges 10/10/11 will need BMET evaluate renal function and check Sats to evaluate need for continued oxygen. Pt will have HH PT, OT, RN as well as home oxygen and bedside commode. Has walker at home.   Discharge Diagnoses:  Principal Problem:  *Community acquired pneumonia Active Problems:  VITAMIN B12 DEFICIENCY  HYPERLIPIDEMIA  NEUROPATHY  HYPERTENSION  CORONARY ARTERY DISEASE Likely underlying COPD ARF on CKD  Discharge Condition: Pt medically stable and ready for discharge to home with [facility] RN/PT/OT oxygen and BSC.   Diet recommendation: heart healthy  History of present illness:  Delightful 78 yo presented to [facility] ED 10/03/11 with complaints cough, fever, dyspnea for 4 hours prior to presentation. Also reported general discomfort and weakness. Also reported mid scapular pain. Work up in ED yielded elevated WC, elevated creatinine, elevated d-dimer and CT angio neg for PE but concerning for pna. Pt admitted.  Hospital Course:   HYPERTENSION controlled during this hospitalization. Cardizem dose decreased to 180 on 10/05/11 for HR 50. HR range  65-74 since change.  CORONARY ARTERY DISEASE: no CP. Continue home meds.    Community acquired pneumonia: blood cultures negative chest xray as below. D-dimer elevated so CT chest obtained and yielded no PE but pna.  Pt remained afebrile. Initially WC elevated but at discharge WC with normal limits.  Avelox started and pt will continue at discharge to complete 7 day course.  Oxygen requirements improved but at discharge did sat to 86% with ambulation on room air. Will be discharged with home oxygen. Recommend evaluating sats on 7/29 appointment to evaluate need for continued oxygen. The patient himself has some underlying  chronic lung disease, most likely COPD given his continued hypoxia and evidence on chest x-ray. With his current severe weakness and attempts at raising himself out of bed lead to significant hypoxia and trouble breathing. Working with home health to set up a raised elevated hospital bed which will improve this as well.   Acute renal insufficiency : Likely related to decrease po intake over last couple of days. Creatinine 1.66 on 7.24 and trending down to 1.4 at discharge. Pt taking pos well. Recommend BMET on 7/29/ appointment with Dr. Clent Ridges.  Chart review indicates baseline range 1.1-1.4.   Leukocytosis: related to pna. Pt remained afebrile, blood cultures neg. At discharge WC within normal limits. Will continue Avelox at discharge to complete 7 day course. Recommend CBC on 7/29 appointment with Dr. Clent Ridges.  ARF on CKD: 3 years ago, the patient's creatinine was at 1.17.  On admission, it was 1.36.  It peaked to as high as 1.66 and on day of discharge was 1.44.  Likely in part from dehydration.   Procedures:  None  Consultations:  None  Discharge Exam: Filed Vitals:   10/06/11 0514  BP: 146/72  Pulse: 70  Temp: 97.9 F (36.6 C)  Resp: 22   Filed Vitals:   10/05/11 1303 10/05/11 2122 10/06/11 0514 10/06/11 0941  BP:  167/79 146/72   Pulse:  71 70   Temp: 98 F (36.7 C) 98.7 F (37.1 C) 97.9 F (36.6 C)   TempSrc: Oral Oral Oral   Resp:  20 22   Height:      Weight:      SpO2:  95% 94% 91%   General: Awake alert  oriented x3.  Cardiovascular: RRR no MGR No LEE Respiratory: Normal effort. BS somewhat distant but clear Abdomen soft +BS  Discharge Instructions  Discharge Orders    Future Appointments: Provider: Department: Dept Phone: Center:   10/10/2011 3:15 PM Nelwyn Salisbury, MD [facility] [phone] [facility]   12/08/2011 11:45 AM Gaylord Shih, MD [facility] [phone] [facility]     Future Orders Please Complete By Expires   Diet - low sodium heart  healthy      Increase activity slowly      Discharge instructions      Comments:   Appointment with Dr. Clent Ridges 10/11/11 at 3:15   Call MD for:  temperature >100.4      Call MD for:  difficulty breathing, headache or visual disturbances      Call MD for:  persistant dizziness or light-headedness        Medication List  As of 10/06/2011  9:42 AM   STOP taking these medications         diltiazem 240 MG 24 hr capsule         TAKE these medications         aspirin 81 MG tablet   Take 81 mg by mouth daily.      clopidogrel 75 MG tablet   Commonly known as: PLAVIX   Take 1 tablet (75 mg total) by mouth daily.      diltiazem 180 MG 24 hr capsule   Commonly known as: CARDIZEM CD   Take 1 capsule (180 mg total) by mouth daily.      fluticasone 50 MCG/ACT nasal spray   Commonly known as: FLONASE   Place 2 sprays into the nose daily.      guaiFENesin 100 MG/5ML Soln   Commonly known as: ROBITUSSIN   Take 10 mLs (200 mg total) by mouth every 4 (four) hours as needed.      l-methylfolate-B6-B12 3-35-2 MG Tabs   Commonly known as: METANX   Take 1 tablet by mouth daily.      lactose free nutrition Liqd   Take 237 mLs by mouth daily at 3 pm.      moxifloxacin 400 MG tablet   Commonly known as: AVELOX   Take 1 tablet (400 mg total) by mouth daily at 8 pm.      multivitamin with minerals Tabs   Take 1 tablet by mouth daily.      nitroGLYCERIN 0.4 MG SL tablet   Commonly known as: NITROSTAT   Place 1 tablet (0.4 mg total) under the tongue every 5 (five) minutes as needed.      senna 8.6 MG Tabs   Commonly known as: SENOKOT   Take 3 tablets by mouth at bedtime as needed. For constipation.      Tamsulosin HCl 0.4 MG Caps   Commonly known as: FLOMAX   Take 1 capsule (0.4 mg total) by mouth daily.      trandolapril 4 MG tablet   Commonly known as: MAVIK   Take 1 tablet (4 mg total) by mouth daily.           Follow-up Information    Follow up with Nelwyn Salisbury, MD on  10/10/2011. (Monday 10/10/11 at 3:15. Will need Bmet to evaluate renal function)    Contact information:   [address] [place] [address] [phone]           The results of significant diagnostics from this hospitalization (including imaging, microbiology,  ancillary and laboratory) are listed below for reference.    Significant Diagnostic Studies: Dg Chest 2 View  10/03/2011   IMPRESSION: No acute process identified.  Stable chronic lung disease.  Original Report Authenticated By: Reola Calkins, M.D.   Ct Angio Chest W/cm &/or Wo Cm  10/03/2011    IMPRESSION:  1.  Tortuous ectatic thoracic aorta with advanced atherosclerotic changes but no focal aneurysm or dissection. 2.  Left lower lobe pneumonia. 3.  Emphysema.  Original Report Authenticated By: P. Loralie Champagne, M.D.    Microbiology:    Labs: Basic Metabolic Panel:  Lab 10/06/11 1610 10/05/11 0404 10/04/11 0415 10/03/11 2036  NA 140 138 138 137  K 4.0 3.9 4.5 4.2  CL 104 107 104 101  CO2 26 25 24 24   GLUCOSE 107* 107* 116* 133*  BUN 22 29* 26* 26*  CREATININE 1.44* 1.66* 1.52* 1.36*  CALCIUM 8.9 8.4 8.5 9.2  MG -- -- -- --  PHOS -- -- -- --   Liver Function Tests:  Lab 10/04/11 0415  AST 34  ALT 13  ALKPHOS 46  BILITOT 0.6  PROT 6.2  ALBUMIN 3.2*    Lab 10/03/11 2048  LIPASE 26  AMYLASE --   CBC:  Lab 10/05/11 0404 10/04/11 0415 10/03/11 2036  WBC 9.4 15.2* 14.9*  NEUTROABS -- 13.7* 13.5*  HGB 11.6* 13.5 15.4  HCT 34.6* 40.0 43.0  MCV 94.0 93.0 91.9  PLT 147* 159 201    Time coordinating discharge: 60 minutes  Signed:  Gwenyth Bender NP [facility] 10/06/2011, 9:42 AM   Patient was seen, evaluated and discussed with my nurse practitioner. Have also spoken extensively today with the patient's daughter and wife for further coordination of his discharge planning. Patient will maximize home health services and follow up with his PCP at the start of next  week.  Virginia Rochester, M.D. [facility] 10/06/11, 12:41 PM

## 2011-10-07 ENCOUNTER — Telehealth: Payer: Self-pay | Admitting: Family Medicine

## 2011-10-07 NOTE — Telephone Encounter (Signed)
Beth a nurse from [employer] called [phone] ) and requested a order for home health aid. Pt was just discharged from hospital yesterday, diagnosed with pneumonia. Dr. Clent Ridges did approve this and I spoke with [facility] and gave a verbal order. She is also asking if we can order a trapeze for pt and [facility] # [phone].

## 2011-10-07 NOTE — Telephone Encounter (Signed)
Done, in your box

## 2011-10-08 LAB — CULTURE, RESPIRATORY W GRAM STAIN

## 2011-10-10 ENCOUNTER — Encounter: Payer: Self-pay | Admitting: Family Medicine

## 2011-10-10 ENCOUNTER — Ambulatory Visit (INDEPENDENT_AMBULATORY_CARE_PROVIDER_SITE_OTHER): Payer: Medicare Other | Admitting: Family Medicine

## 2011-10-10 VITALS — BP 112/76 | HR 94 | Temp 98.2°F | Wt 152.0 lb

## 2011-10-10 DIAGNOSIS — E539 Vitamin B deficiency, unspecified: Secondary | ICD-10-CM

## 2011-10-10 DIAGNOSIS — N289 Disorder of kidney and ureter, unspecified: Secondary | ICD-10-CM

## 2011-10-10 DIAGNOSIS — J189 Pneumonia, unspecified organism: Secondary | ICD-10-CM

## 2011-10-10 DIAGNOSIS — J4489 Other specified chronic obstructive pulmonary disease: Secondary | ICD-10-CM

## 2011-10-10 DIAGNOSIS — J449 Chronic obstructive pulmonary disease, unspecified: Secondary | ICD-10-CM | POA: Insufficient documentation

## 2011-10-10 LAB — CULTURE, BLOOD (ROUTINE X 2): Culture: NO GROWTH

## 2011-10-10 MED ORDER — CYANOCOBALAMIN 1000 MCG/ML IJ SOLN
1000.0000 ug | Freq: Once | INTRAMUSCULAR | Status: AC
  Administered 2011-10-10: 1000 ug via INTRAMUSCULAR

## 2011-10-10 NOTE — Telephone Encounter (Signed)
I faxed scrip to below number.

## 2011-10-10 NOTE — Progress Notes (Signed)
  Subjective:    Patient ID: Thomas [name], male    DOB: 07/21/1916, 78 y.o.   MRN: [id number]  HPI Here with family to follow up a hospital stay from 10-03-11 to 10-06-11 for community acquired pneumonia. He had a chest CT which revealed emphysema and a LLL pneumonia. He was treated with IV antibiotics and was sent home on Avelox. He desaturated to 86% when walking down the hall in the hospital, so he was sent home on [place] oxygen. He had some ARF with creatinine jumping up to 1.66, but this declined to 1.44 at DC. He was felt to have been dehydrated. His WBC went as high as 15 but normalized with treatment. He has Home Health coming for PT and OT. He feels better now but he is still weak and coughs occasionally. No chest pain or fever. His appetite is poor but his family is encouraging him to eat and drink fluids.    Review of Systems  Constitutional: Positive for fatigue.  Respiratory: Positive for cough and shortness of breath. Negative for chest tightness and wheezing.   Cardiovascular: Negative.        Objective:   Physical Exam  Constitutional: He is oriented to person, place, and time.       Thin, weak but alert. Walks slowly with a walker.   Neck: No thyromegaly present.  Cardiovascular: Normal rate, regular rhythm, normal heart sounds and intact distal pulses.   Pulmonary/Chest: He has no wheezes. He has no rales.       Scattered soft rhonchi at rest, no rales. After walking down the hall about 30 feet and back, he had audible wheezing and was mildly dyspneic. His oxygen sats dropped to 79% on RA while walking.   Lymphadenopathy:    He has no cervical adenopathy.  Neurological: He is alert and oriented to person, place, and time.          Assessment & Plan:  He is recovering from pneumonia as expected. After he takes his last dose of Avelox tonight, he can stop. He still requires continual oxygen, and I told him to never take it off. His COPD is now a major factor. We will send  him for a CBC and BMET today to follow up the lab abnormalities above.

## 2011-10-11 ENCOUNTER — Ambulatory Visit: Payer: Medicare Other | Admitting: Family Medicine

## 2011-10-11 LAB — CBC WITH DIFFERENTIAL/PLATELET
Basophils Absolute: 0 10*3/uL (ref 0.0–0.1)
Eosinophils Absolute: 0.2 10*3/uL (ref 0.0–0.7)
Eosinophils Relative: 2.1 % (ref 0.0–5.0)
Lymphs Abs: 1.7 10*3/uL (ref 0.7–4.0)
MCHC: 33.8 g/dL (ref 30.0–36.0)
MCV: 95.8 fl (ref 78.0–100.0)
Monocytes Absolute: 0.7 10*3/uL (ref 0.1–1.0)
Neutrophils Relative %: 67.9 % (ref 43.0–77.0)
Platelets: 241 10*3/uL (ref 150.0–400.0)
RDW: 13.1 % (ref 11.5–14.6)
WBC: 8.1 10*3/uL (ref 4.5–10.5)

## 2011-10-11 LAB — BASIC METABOLIC PANEL
Calcium: 8.9 mg/dL (ref 8.4–10.5)
GFR: 45.35 mL/min — ABNORMAL LOW (ref >60.00)
Potassium: 4.7 mEq/L (ref 3.5–5.1)
Sodium: 141 mEq/L (ref 135–145)

## 2011-10-14 ENCOUNTER — Other Ambulatory Visit: Payer: Self-pay | Admitting: Family Medicine

## 2011-10-17 ENCOUNTER — Encounter: Payer: Self-pay | Admitting: Family Medicine

## 2011-10-17 NOTE — Progress Notes (Signed)
Quick Note:  I spoke with pt's wife and put a copy of results in mail. ______ 

## 2011-10-19 ENCOUNTER — Telehealth: Payer: Self-pay | Admitting: Family Medicine

## 2011-10-19 DIAGNOSIS — R0902 Hypoxemia: Secondary | ICD-10-CM

## 2011-10-19 DIAGNOSIS — I10 Essential (primary) hypertension: Secondary | ICD-10-CM

## 2011-10-19 DIAGNOSIS — J189 Pneumonia, unspecified organism: Secondary | ICD-10-CM

## 2011-10-19 DIAGNOSIS — M6281 Muscle weakness (generalized): Secondary | ICD-10-CM

## 2011-10-19 NOTE — Telephone Encounter (Signed)
I spoke with pt and gave the below message.

## 2011-10-19 NOTE — Telephone Encounter (Signed)
Tell him to stay on the oxygen for now (continuously)

## 2011-10-19 NOTE — Telephone Encounter (Signed)
Pt calling with report from home nurse. States she walked him around house 3 times and then took his O2 sat. It was 91. Home nurse feels he can be taken off the O2. Requesting approval order on that (or not) from Dr. Clent Ridges. Pt wanting call back with answer please.

## 2011-10-22 ENCOUNTER — Emergency Department (HOSPITAL_COMMUNITY)
Admission: EM | Admit: 2011-10-22 | Discharge: 2011-10-22 | Disposition: A | Discharge status: Home / Self Care | Payer: Medicare Other | Attending: Emergency Medicine | Admitting: Emergency Medicine

## 2011-10-22 DIAGNOSIS — I2510 Atherosclerotic heart disease of native coronary artery without angina pectoris: Secondary | ICD-10-CM | POA: Insufficient documentation

## 2011-10-22 DIAGNOSIS — I10 Essential (primary) hypertension: Secondary | ICD-10-CM | POA: Insufficient documentation

## 2011-10-22 DIAGNOSIS — Z87891 Personal history of nicotine dependence: Secondary | ICD-10-CM | POA: Insufficient documentation

## 2011-10-22 DIAGNOSIS — M1990 Unspecified osteoarthritis, unspecified site: Secondary | ICD-10-CM | POA: Insufficient documentation

## 2011-10-22 DIAGNOSIS — R339 Retention of urine, unspecified: Secondary | ICD-10-CM | POA: Insufficient documentation

## 2011-10-22 DIAGNOSIS — Z79899 Other long term (current) drug therapy: Secondary | ICD-10-CM | POA: Insufficient documentation

## 2011-10-22 DIAGNOSIS — K219 Gastro-esophageal reflux disease without esophagitis: Secondary | ICD-10-CM | POA: Insufficient documentation

## 2011-10-22 DIAGNOSIS — E785 Hyperlipidemia, unspecified: Secondary | ICD-10-CM | POA: Insufficient documentation

## 2011-10-22 DIAGNOSIS — I252 Old myocardial infarction: Secondary | ICD-10-CM | POA: Insufficient documentation

## 2011-10-22 DIAGNOSIS — Z7982 Long term (current) use of aspirin: Secondary | ICD-10-CM | POA: Insufficient documentation

## 2011-10-22 LAB — URINALYSIS, MICROSCOPIC ONLY
Leukocytes, UA: NEGATIVE
Nitrite: NEGATIVE
Specific Gravity, Urine: 1.01 (ref 1.005–1.030)
Urobilinogen, UA: 0.2 mg/dL (ref 0.0–1.0)
pH: 6 (ref 5.0–8.0)

## 2011-10-22 MED ORDER — HYDROCODONE-ACETAMINOPHEN 5-325 MG PO TABS
1.0000 | ORAL_TABLET | Freq: Once | ORAL | Status: AC
  Administered 2011-10-22: 1 via ORAL
  Filled 2011-10-22: qty 1

## 2011-10-22 MED ORDER — HYDROCODONE-ACETAMINOPHEN 5-500 MG PO TABS: 1.0000 | ORAL_TABLET | Freq: Four times a day (QID) | ORAL | Status: AC | PRN

## 2011-10-22 NOTE — ED Notes (Signed)
Per EMS: Pt has been trying to urinate since about 9 pm last night.  States that he has been trying to urinate 15-20 times since.  Unable.  States he is in pain 10/10 in his penis.

## 2011-10-22 NOTE — ED Notes (Signed)
WUJ:[id number]<JY> Expected date:10/22/11<BR> Expected time: 7:50 AM<BR> Means of arrival:Ambulance<BR> Comments:<BR> Urinary retention

## 2011-10-22 NOTE — ED Provider Notes (Signed)
History     CSN: [id number]  Arrival date & time 10/22/11  7829   First MD Initiated Contact with Patient 10/22/11 0801      Chief Complaint  Patient presents with  . Urinary Retention    (Consider location/radiation/quality/duration/timing/severity/associated sxs/prior treatment) The history is provided by the patient and a relative.  pt c/o urinary retention since last pm at approximately 10 pm. Symptoms constant, no specific exacerbating or allev factors. States feels is unable to empty bladder, persistent urgency, has been up to bathroom multiple times, but only able to go small amt. Notes hx same, hx enlarged prostate, also hx bladder ca. Sees Dr Davis/urology. States has had to have urinary catheter previously, denies recent. Recent hospitalization for pna. States has completed abx, feels breathing much better/at baseline. No fever or chills. No nv. No back or flank pain.   Past Medical History  Diagnosis Date  . Mild aortic stenosis     last echo 08/2006  . Coronary artery disease     nonobstructive  . Myocardial infarction   . Hyperlipidemia   . Hypertension   . Vitamin B12 deficiency   . Neuropathy   . Allergic rhinitis   . OA (osteoarthritis)   . History of bladder cancer   . GERD (gastroesophageal reflux disease)   . Esophageal stricture     Past Surgical History  Procedure Date  . Coronary angioplasty   . Appendectomy   . Cholecystectomy   . Tonsillectomy   . Sigmoidoscopy 1999  . Colonoscopy   . Bilateral elbow surgery   . Left knee replacement   . Carotid endarterectomy 08/2008  . Esophagogastroduodenoscopy     dilation    Family History  Problem Relation Age of Onset  . Coronary artery disease Brother   . Prostate cancer      first degree relative    History  Substance Use Topics  . Smoking status: Former Smoker -- 1.0 packs/day for 10 years    Quit date: 03/14/1950  . Smokeless tobacco: Never Used  . Alcohol Use: 3.5 oz/week    7 drink(s)  per week      Review of Systems  Constitutional: Negative for fever and chills.  HENT: Negative for neck pain.   Eyes: Negative for redness.  Respiratory: Negative for shortness of breath.   Cardiovascular: Negative for chest pain.  Gastrointestinal: Negative for vomiting, abdominal pain and diarrhea.  Genitourinary: Negative for dysuria and hematuria.  Musculoskeletal: Negative for back pain.  Skin: Negative for rash.  Neurological: Negative for headaches.  Hematological: Does not bruise/bleed easily.  Psychiatric/Behavioral: Negative for confusion.    Allergies  Sulfamethoxazole  Home Medications   Current Outpatient Rx  Name Route Sig Dispense Refill  . ASPIRIN 81 MG PO TABS Oral Take 81 mg by mouth daily.      Marland Kitchen CLOPIDOGREL BISULFATE 75 MG PO TABS Oral Take 1 tablet (75 mg total) by mouth daily. 90 tablet 3  . DILTIAZEM HCL ER COATED BEADS 180 MG PO CP24 Oral Take 1 capsule (180 mg total) by mouth daily. 30 capsule 0  . FLUTICASONE PROPIONATE 50 MCG/ACT NA SUSP Nasal Place 2 sprays into the nose daily. 16 g 3    Please send pt a 3 month supply, refill X 3  . GUAIFENESIN 100 MG/5ML PO SOLN Oral Take 10 mLs (200 mg total) by mouth every 4 (four) hours as needed. 1200 mL 0  . L-METHYLFOLATE-B6-B12 3-35-2 MG PO TABS Oral Take 1 tablet by  mouth daily. 60 tablet 0  . BOOST PLUS PO LIQD Oral Take 237 mLs by mouth daily at 3 pm.    . ADULT MULTIVITAMIN W/MINERALS CH Oral Take 1 tablet by mouth daily.    Marland Kitchen NITROGLYCERIN 0.4 MG SL SUBL Sublingual Place 1 tablet (0.4 mg total) under the tongue every 5 (five) minutes as needed. 75 tablet 3    Script called in to [facility] [phone] per pt reque ...  . SENNA 8.6 MG PO TABS Oral Take 3 tablets by mouth at bedtime as needed. For constipation.    Marland Kitchen TAMSULOSIN HCL 0.4 MG PO CAPS Oral Take 1 capsule (0.4 mg total) by mouth daily. 90 capsule 3  . TRANDOLAPRIL 4 MG PO TABS Oral Take 1 tablet (4 mg total) by mouth daily. 90 tablet 3    BP  192/84  Pulse 78  Temp 98 F (36.7 C) (Oral)  Resp 16  SpO2 99%  Physical Exam  Nursing note and vitals reviewed. Constitutional: He appears well-developed and well-nourished. No distress.  HENT:  Head: Atraumatic.  Eyes: Pupils are equal, round, and reactive to light.  Neck: Neck supple. No tracheal deviation present.  Cardiovascular: Normal rate.   Pulmonary/Chest: Effort normal. No accessory muscle usage. No respiratory distress.  Abdominal: Soft. He exhibits no distension. There is no tenderness.  Genitourinary:       No cva tenderness. Normal ext genitalia.   Musculoskeletal: Normal range of motion.  Neurological: He is alert.  Skin: Skin is warm and dry.  Psychiatric: He has a normal mood and affect.    ED Course  Procedures (including critical care time)   Labs Reviewed  URINALYSIS, WITH MICROSCOPIC   Results for orders placed during the hospital encounter of 10/22/11  URINALYSIS, WITH MICROSCOPIC      Component Value Range   Color, Urine YELLOW  YELLOW   APPearance CLEAR  CLEAR   Specific Gravity, Urine 1.010  1.005 - 1.030   pH 6.0  5.0 - 8.0   Glucose, UA NEGATIVE  NEGATIVE mg/dL   Hgb urine dipstick TRACE (*) NEGATIVE   Bilirubin Urine NEGATIVE  NEGATIVE   Ketones, ur NEGATIVE  NEGATIVE mg/dL   Protein, ur NEGATIVE  NEGATIVE mg/dL   Urobilinogen, UA 0.2  0.0 - 1.0 mg/dL   Nitrite NEGATIVE  NEGATIVE   Leukocytes, UA NEGATIVE  NEGATIVE   WBC, UA 0-2  <3 WBC/hpf   RBC / HPF 0-2  <3 RBC/hpf   Squamous Epithelial / LPF RARE  RARE       MDM  Foley catheter, will check pvr. ua sent.  abd soft nt.   Pt notes pain in penis w catheter insertion. Cath was placed to hub prior to inflation, urine flowing freely, clear, no hematuria. No abd pain. abd soft nt.   Hydrocodone for pain.  pvr approx 400 cc, will give leg bag, f/u urologist Monday. Pt is on flomax. Will give small quantity rx vicodin re pain.           Suzi Roots, MD 10/22/11  [phone]

## 2011-10-22 NOTE — ED Notes (Signed)
Pt states that he has had problems with urinary retention before.  Has had catheters placed.  States that he has had prostate problems and sees a urologist for it.  Foley catheter is now in place.  Pt states that he feels so much better.

## 2011-10-22 NOTE — ED Notes (Signed)
Pt was seen at [facility] for pneumonia about a week ago and has been on oxygen 3 L since.

## 2011-10-24 ENCOUNTER — Telehealth: Payer: Self-pay | Admitting: Family Medicine

## 2011-10-24 NOTE — Telephone Encounter (Signed)
Caller: [name]; Patient Name: Thomas [name]; PCP: ; Janith Lima Phone Number: [phone].  Pt was seen in the ER on Saturday and was told to follow up with urologist today.  No triage done: pt needs an appt with urologist.  Caller given phone number for the urologist, Darvin Neighbours.  Caller will call office for an appt.

## 2011-10-27 ENCOUNTER — Telehealth: Payer: Self-pay | Admitting: Family Medicine

## 2011-10-27 NOTE — Telephone Encounter (Signed)
Please advise 

## 2011-10-27 NOTE — Telephone Encounter (Signed)
Pt called and wanted to know when Dr Clent Ridges wants to see pt again re: oxygen. Pls call.

## 2011-10-28 LAB — CULTURE, BLOOD (ROUTINE X 2): Special Requests: NORMAL

## 2011-10-28 NOTE — Telephone Encounter (Signed)
Called and lft vm for pt that Dr Clent Ridges said that pt should sch next ov for last wk in Aug.

## 2011-10-28 NOTE — Telephone Encounter (Signed)
Have him see me the last week of this month, as that would be one month from when I saw him last

## 2011-10-31 ENCOUNTER — Telehealth: Payer: Self-pay | Admitting: Family Medicine

## 2011-10-31 MED ORDER — DILTIAZEM HCL ER COATED BEADS 180 MG PO CP24: 180.0000 mg | ORAL_CAPSULE | Freq: Every day | ORAL | Status: DC

## 2011-10-31 NOTE — Telephone Encounter (Signed)
Pt returned call re: coming in for ov last week Aug, but pt req to get in sooner than that because pt is having problems with the oxygen. Pt is sch to come in on 11/01/11 2:15pm.

## 2011-10-31 NOTE — Telephone Encounter (Signed)
I sent script e-scribe. 

## 2011-10-31 NOTE — Telephone Encounter (Signed)
Patient called stating that he need a refill of his [facility]em [facility]. Please assist.

## 2011-11-01 ENCOUNTER — Encounter: Payer: Self-pay | Admitting: Family Medicine

## 2011-11-01 ENCOUNTER — Ambulatory Visit (INDEPENDENT_AMBULATORY_CARE_PROVIDER_SITE_OTHER): Payer: Medicare Other | Admitting: Family Medicine

## 2011-11-01 VITALS — BP 120/70 | HR 87 | Temp 98.5°F

## 2011-11-01 DIAGNOSIS — J449 Chronic obstructive pulmonary disease, unspecified: Secondary | ICD-10-CM

## 2011-11-01 DIAGNOSIS — J189 Pneumonia, unspecified organism: Secondary | ICD-10-CM

## 2011-11-01 MED ORDER — TEMAZEPAM 15 MG PO CAPS: 15.0000 mg | ORAL_CAPSULE | Freq: Every evening | ORAL | Status: DC | PRN

## 2011-11-01 NOTE — Progress Notes (Signed)
  Subjective:    Patient ID: Thomas [name], male    DOB: Feb 23, 1917, 78 y.o.   MRN: [id number]  HPI Here to follow up on COPD and pneumonia. He has been wearing [place] oxygen continuously at home. He feels much better and wants to come off oxygen. He is not coughing and he denies any SOB. He does have trouble sleeping.    Review of Systems  Constitutional: Negative.   Respiratory: Negative.   Cardiovascular: Negative.   Psychiatric/Behavioral: Positive for disturbed wake/sleep cycle. Negative for dysphoric mood. The patient is not nervous/anxious.        Objective:   Physical Exam  Constitutional: He appears well-developed and well-nourished.  Cardiovascular: Normal rate, regular rhythm, normal heart sounds and intact distal pulses.   Pulmonary/Chest: Effort normal and breath sounds normal.          Assessment & Plan:  He looks to be back at his baseline. We had him walk down the length of the building and back without the oxygen, and his RA sat dropped to 91% which is satisfactory. I stopped the oxygen, and we will have HH come take it away. Try Temazepam for sleep.

## 2011-11-06 ENCOUNTER — Telehealth: Payer: Self-pay | Admitting: Family Medicine

## 2011-11-06 NOTE — Telephone Encounter (Signed)
On-call note:  Call from [facility] nurse, pt with urinary retention x 24h, already on flomax and has hx of acute urinary retention during relatively recent acute illness/hospitalization with pneumonia. Has been urinating fine up until last 24h. Gave orders for I/O cath, send for UA with c/s, increase flomax to 0.8mg  qhs today.  If not able to urinate any again tonight, seek care at [facility] or ED.   Otherwise, f/u with Dr. Clent Ridges early next week.

## 2011-11-09 ENCOUNTER — Telehealth: Payer: Self-pay | Admitting: Family Medicine

## 2011-11-09 DIAGNOSIS — N320 Bladder-neck obstruction: Secondary | ICD-10-CM

## 2011-11-09 NOTE — Telephone Encounter (Signed)
Tell them I have done an urgent referral that Camelia Eng is working on. If he gets very uncomfortable he would need to go to the ER in the meantime

## 2011-11-09 NOTE — Telephone Encounter (Signed)
I spoke with pt and gave the below information. 

## 2011-11-09 NOTE — Telephone Encounter (Signed)
Caller: [name]/Spouse; Patient Name: Thomas [name]; PCP: Gershon Crane [facility]); Best Callback Phone Number: [phone]; Reason for call: Urinary Pain/Bleeding; Couldn't sleep last night-had to go every 15 minutes during the night.  Was only able to dribble.  Was able ( this morning) to drink fluids and walk until he was able to pass his urine; Onset of this episode was during the night on 11/09/11; Has been having on going issues since 10/21/11 with inability to urinate.  Triaged using Urinary Symptoms with a disposition to call provider immediately due to current or recent urinary track instrumentation and urinary tract symptoms. Caller is asking for a referral to a urologist closer to where they live since they do not drive any longer. Checked for available appointments for 11/09/11 with none noted.    OFFICE: PLEASE FOLLOW UP WITH PATIENT DUE TO DISPOSITION TO SEE (CALL) PROVIDER IMMEDIATELY.  NO APPOINTMENTS AVAILABLE-IS HAVING DIFFICULTY URINATING. ALSO REQUESTING A REFERRAL TO UROLOGIST CLOSER TO THEM SINCE THEY NO LONGER DRIVE.

## 2011-11-10 ENCOUNTER — Telehealth: Payer: Self-pay | Admitting: Family Medicine

## 2011-11-10 MED ORDER — NITROFURANTOIN MONOHYD MACRO 100 MG PO CAPS: 100.0000 mg | ORAL_CAPSULE | Freq: Two times a day (BID) | ORAL | Status: AC

## 2011-11-10 NOTE — Telephone Encounter (Signed)
Dr. Clent Ridges ordered Macrobid 100 mg take 1 bid for 10 days due to urine culture. I sent script e-scribe and spoke with pt's wife.

## 2011-11-11 ENCOUNTER — Telehealth: Payer: Self-pay | Admitting: Family Medicine

## 2011-11-11 NOTE — Telephone Encounter (Signed)
Triage Call Report Triage Record Num: [id number] Operator: Remonia Richter Patient Name: Thomas [name] Call Date & Time: 11/10/2011 6:34:39PM Patient Phone: [phone] PCP: Tera Mater. Clent Ridges Patient Gender: Male PCP Fax : [phone] Patient DOB: 22-Mar-1916 Practice Name: [facility] Reason for Call: Caller: [name]/Spouse; PCP: Gershon Crane (Family Practice); CB#: [phone]: meds not at pharmacy as told to her by Home Health Nurse for UTI, she uses [facility] at [phone] at [address] ,Epic record checked and [facility], she prefers to get the antibiotic started so it was called in per MD order in Epic to listed [facility], caller denies worsening symptoms in spouse or need for triage Protocol(s) Used: Medication Questions - Adult Recommended Outcome per Protocol: Provided Health Information Reason for Outcome: Caller has medication question(s) that was answered with available resources Care Advice: ~ 08/

## 2011-12-05 ENCOUNTER — Encounter: Payer: Self-pay | Admitting: Family Medicine

## 2011-12-05 ENCOUNTER — Ambulatory Visit (INDEPENDENT_AMBULATORY_CARE_PROVIDER_SITE_OTHER): Payer: Medicare Other | Admitting: Family Medicine

## 2011-12-05 ENCOUNTER — Ambulatory Visit: Payer: Medicare Other | Admitting: Cardiology

## 2011-12-05 VITALS — BP 128/76 | HR 105 | Temp 97.6°F | Wt 147.0 lb

## 2011-12-05 DIAGNOSIS — N401 Enlarged prostate with lower urinary tract symptoms: Secondary | ICD-10-CM

## 2011-12-05 DIAGNOSIS — J449 Chronic obstructive pulmonary disease, unspecified: Secondary | ICD-10-CM

## 2011-12-05 MED ORDER — FESOTERODINE FUMARATE ER 4 MG PO TB24: 4.0000 mg | ORAL_TABLET | Freq: Every day | ORAL | Status: DC

## 2011-12-05 NOTE — Progress Notes (Signed)
  Subjective:    Patient ID: Thomas [name], male    DOB: December 02, 1916, 78 y.o.   MRN: [id number]  HPI Here to follow up on COPD, BPH, and overactive bladder. At our last visit we stopped his [place] oxygen, and he has done very well since then. He gets around and does the things he wants to do. Ho still drives his car. He has mild SOB on exertion but no more coughing. He has been seeing Dr. Sherron Monday for urinary urgency and frequency. He has tried some samples of Myrbetriq for the past month with no improvement.    Review of Systems  Constitutional: Negative.   Respiratory: Negative.   Cardiovascular: Negative.   Genitourinary: Positive for urgency, frequency and difficulty urinating. Negative for dysuria, hematuria and flank pain.       Objective:   Physical Exam  Constitutional: He appears well-developed and well-nourished.       He looks good, alert, walking with his cane   Neck: No thyromegaly present.  Cardiovascular: Normal rate, regular rhythm, normal heart sounds and intact distal pulses.   Pulmonary/Chest: Effort normal and breath sounds normal.  Lymphadenopathy:    He has no cervical adenopathy.          Assessment & Plan:  His COPD is stable off all supplemental oxygen. As for the OAB, I gave him samples to try Toviaz once daily. Recheck one month

## 2011-12-06 ENCOUNTER — Encounter (HOSPITAL_COMMUNITY): Payer: Self-pay | Admitting: *Deleted

## 2011-12-06 ENCOUNTER — Emergency Department (HOSPITAL_COMMUNITY)
Admission: EM | Admit: 2011-12-06 | Discharge: 2011-12-06 | Disposition: A | Discharge status: Home / Self Care | Payer: Medicare Other | Attending: Emergency Medicine | Admitting: Emergency Medicine

## 2011-12-06 DIAGNOSIS — K219 Gastro-esophageal reflux disease without esophagitis: Secondary | ICD-10-CM | POA: Insufficient documentation

## 2011-12-06 DIAGNOSIS — Z9089 Acquired absence of other organs: Secondary | ICD-10-CM | POA: Insufficient documentation

## 2011-12-06 DIAGNOSIS — M1990 Unspecified osteoarthritis, unspecified site: Secondary | ICD-10-CM | POA: Insufficient documentation

## 2011-12-06 DIAGNOSIS — R338 Other retention of urine: Secondary | ICD-10-CM | POA: Insufficient documentation

## 2011-12-06 DIAGNOSIS — I359 Nonrheumatic aortic valve disorder, unspecified: Secondary | ICD-10-CM | POA: Insufficient documentation

## 2011-12-06 DIAGNOSIS — I10 Essential (primary) hypertension: Secondary | ICD-10-CM | POA: Insufficient documentation

## 2011-12-06 DIAGNOSIS — I252 Old myocardial infarction: Secondary | ICD-10-CM | POA: Insufficient documentation

## 2011-12-06 DIAGNOSIS — Z79899 Other long term (current) drug therapy: Secondary | ICD-10-CM | POA: Insufficient documentation

## 2011-12-06 DIAGNOSIS — Z87891 Personal history of nicotine dependence: Secondary | ICD-10-CM | POA: Insufficient documentation

## 2011-12-06 DIAGNOSIS — E785 Hyperlipidemia, unspecified: Secondary | ICD-10-CM | POA: Insufficient documentation

## 2011-12-06 DIAGNOSIS — I2510 Atherosclerotic heart disease of native coronary artery without angina pectoris: Secondary | ICD-10-CM | POA: Insufficient documentation

## 2011-12-06 LAB — BASIC METABOLIC PANEL
CO2: 25 mEq/L (ref 19–32)
Chloride: 97 mEq/L (ref 96–112)
GFR calc non Af Amer: 44 mL/min — ABNORMAL LOW (ref >90)
Glucose, Bld: 97 mg/dL (ref 70–99)
Potassium: 4 mEq/L (ref 3.5–5.1)
Sodium: 131 mEq/L — ABNORMAL LOW (ref 135–145)

## 2011-12-06 LAB — URINALYSIS, MICROSCOPIC ONLY
Bilirubin Urine: NEGATIVE
Glucose, UA: NEGATIVE mg/dL
Hgb urine dipstick: NEGATIVE
Ketones, ur: NEGATIVE mg/dL
Protein, ur: NEGATIVE mg/dL
Urobilinogen, UA: 1 mg/dL (ref 0.0–1.0)

## 2011-12-06 NOTE — ED Provider Notes (Signed)
Medical screening examination/treatment/procedure(s) were conducted as a shared visit with non-physician practitioner(s) and myself.  I personally evaluated the patient during the encounter. Patient has history of urinary retention in the past, presents with same presentation. Much improved after Foley placed. To followup with urology.  Olivia Mackie, MD 12/06/11 [phone]

## 2011-12-06 NOTE — ED Notes (Signed)
ZOX:[id number]<AV> Expected date:12/06/11<BR> Expected time: 5:39 AM<BR> Means of arrival:Ambulance<BR> Comments:<BR> Urinary retention

## 2011-12-06 NOTE — ED Notes (Addendum)
Pt from home. Unable to urinate since 1800 yesterday. History of bladder spasms. Requesting a foley catheter.  VS: 162/70, HR 70, RR 16, O2 sats 93% on room air. Alert and oriented x 4.

## 2011-12-06 NOTE — ED Provider Notes (Signed)
History     CSN: [id number]  Arrival date & time 12/06/11  0550   First MD Initiated Contact with Patient 12/06/11 [phone]      Chief Complaint  Patient presents with  . Urinary Retention    (Consider location/radiation/quality/duration/timing/severity/associated sxs/prior treatment) HPI  78 y.o. male in no acute distress complaining of urinary retention last urination was last night at 6 PM. Patient has a history of bladder spasms and is seen by the urologist Dr. Fara Chute. Took Azo 2 am last night. Denies any fever, nausea vomiting, chest pain or shortness of breath.  Past Medical History  Diagnosis Date  . Mild aortic stenosis     last echo 08/2006  . Coronary artery disease     nonobstructive  . Myocardial infarction   . Hyperlipidemia   . Hypertension   . Vitamin B12 deficiency   . Neuropathy   . Allergic rhinitis   . OA (osteoarthritis)   . History of bladder cancer   . GERD (gastroesophageal reflux disease)   . Esophageal stricture     Past Surgical History  Procedure Date  . Coronary angioplasty   . Appendectomy   . Cholecystectomy   . Tonsillectomy   . Sigmoidoscopy 1999  . Colonoscopy   . Bilateral elbow surgery   . Left knee replacement   . Carotid endarterectomy 08/2008  . Esophagogastroduodenoscopy     dilation    Family History  Problem Relation Age of Onset  . Coronary artery disease Brother   . Prostate cancer      first degree relative    History  Substance Use Topics  . Smoking status: Former Smoker -- 1.0 packs/day for 10 years    Quit date: 03/14/1950  . Smokeless tobacco: Never Used  . Alcohol Use: No      Review of Systems  Constitutional: Negative for fever.  Respiratory: Negative for shortness of breath.   Cardiovascular: Negative for chest pain.  Gastrointestinal: Negative for nausea, vomiting, abdominal pain and diarrhea.  Genitourinary: Positive for difficulty urinating.  All other systems reviewed and are  negative.    Allergies  Sulfamethoxazole  Home Medications   Current Outpatient Rx  Name Route Sig Dispense Refill  . ASPIRIN 81 MG PO TABS Oral Take 81 mg by mouth daily.      Marland Kitchen DILTIAZEM HCL ER COATED BEADS 180 MG PO CP24 Oral Take 1 capsule (180 mg total) by mouth daily. 90 capsule 3  . FESOTERODINE FUMARATE ER 4 MG PO TB24 Oral Take 1 tablet (4 mg total) by mouth daily. 14 tablet 0  . FLUTICASONE PROPIONATE 50 MCG/ACT NA SUSP Nasal Place 2 sprays into the nose daily.    . L-METHYLFOLATE-B6-B12 3-35-2 MG PO TABS Oral Take 1 tablet by mouth daily.    . ADULT MULTIVITAMIN W/MINERALS CH Oral Take 1 tablet by mouth daily.    Marland Kitchen NITROGLYCERIN 0.4 MG SL SUBL Sublingual Place 1 tablet (0.4 mg total) under the tongue every 5 (five) minutes as needed. 75 tablet 3    Script called in to [facility] [phone] per pt reque ...  . SENNA 8.6 MG PO TABS Oral Take 3 tablets by mouth every other day. For constipation.    Marland Kitchen TAMSULOSIN HCL 0.4 MG PO CAPS Oral Take 0.4 mg by mouth 2 (two) times daily.     . TRANDOLAPRIL 4 MG PO TABS Oral Take 4 mg by mouth daily.    Marland Kitchen TEMAZEPAM 15 MG PO CAPS Oral Take 1 capsule (  15 mg total) by mouth at bedtime as needed for sleep. 30 capsule 5    BP 159/85  Pulse 73  Temp 97.5 F (36.4 C) (Oral)  Resp 24  SpO2 94%  Physical Exam  Nursing note and vitals reviewed. Constitutional: He is oriented to person, place, and time. He appears well-developed and well-nourished. No distress.  HENT:  Head: Normocephalic.  Mouth/Throat: Oropharynx is clear and moist.  Eyes: Conjunctivae normal and EOM are normal.  Neck: Normal range of motion.  Cardiovascular: Normal rate.   Pulmonary/Chest: Effort normal. No stridor.  Abdominal: Soft. Bowel sounds are normal. He exhibits no distension and no mass. There is no tenderness. There is no rebound and no guarding.  Musculoskeletal: Normal range of motion.  Neurological: He is alert and oriented to person, place, and time.   Psychiatric: He has a normal mood and affect.    ED Course  Procedures (including critical care time)  Labs Reviewed  URINALYSIS, MICROSCOPIC ONLY - Abnormal; Notable for the following:    Color, Urine ORANGE (*)  BIOCHEMICALS MAY BE AFFECTED BY COLOR   Nitrite POSITIVE (*)     Leukocytes, UA TRACE (*)     All other components within normal limits  BASIC METABOLIC PANEL - Abnormal; Notable for the following:    Sodium 131 (*)     GFR calc non Af Amer 44 (*)     GFR calc Af Amer 51 (*)     All other components within normal limits  URINE CULTURE   No results found.   1. Acute urinary retention       MDM  Patient occasionally 2 AM which is likely affecting the urinalysis although it is nitrite and leukocyte positive there are no bacteria this for this reason I will culture and start treatment until culture results are obtained.  After Foley catheter was placed 800 mils of urine was obtained. I will discharge patient with Foley in place ask him to follow with urologist this week.  Shared visit with attending Dr. Norlene Campbell.   Pt verbalized understanding and agrees with care plan. Outpatient follow-up and return precautions given.         Wynetta Emery, PA-C 12/06/11 0740

## 2011-12-08 ENCOUNTER — Other Ambulatory Visit: Payer: Self-pay | Admitting: Family Medicine

## 2011-12-08 ENCOUNTER — Encounter: Payer: Self-pay | Admitting: Cardiology

## 2011-12-08 ENCOUNTER — Ambulatory Visit (INDEPENDENT_AMBULATORY_CARE_PROVIDER_SITE_OTHER): Payer: Medicare Other | Admitting: Cardiology

## 2011-12-08 VITALS — BP 123/76 | HR 79 | Ht 70.0 in | Wt 147.4 lb

## 2011-12-08 DIAGNOSIS — I252 Old myocardial infarction: Secondary | ICD-10-CM

## 2011-12-08 DIAGNOSIS — I6529 Occlusion and stenosis of unspecified carotid artery: Secondary | ICD-10-CM

## 2011-12-08 DIAGNOSIS — E785 Hyperlipidemia, unspecified: Secondary | ICD-10-CM

## 2011-12-08 DIAGNOSIS — I2510 Atherosclerotic heart disease of native coronary artery without angina pectoris: Secondary | ICD-10-CM

## 2011-12-08 DIAGNOSIS — I10 Essential (primary) hypertension: Secondary | ICD-10-CM

## 2011-12-08 DIAGNOSIS — I359 Nonrheumatic aortic valve disorder, unspecified: Secondary | ICD-10-CM

## 2011-12-08 NOTE — Telephone Encounter (Signed)
This should come from his Urologist

## 2011-12-08 NOTE — Assessment & Plan Note (Signed)
Stable by exam and history. Conservative therapy.

## 2011-12-08 NOTE — Assessment & Plan Note (Signed)
Stable. Continue conservative therapy and secondary prevention. Return the office in a year.

## 2011-12-08 NOTE — Progress Notes (Signed)
HPI Mr. Thomas [name] comes in today for followup and evaluation of his coronary artery disease and history of MI.  He developed pneumonia several months ago and has never quite recovered. He is very discouraged and feels like he wants to give up. He has a when all this will  be over. He does have end of life planning in place.  He denies any angina or ischemic symptoms. He denies orthopnea, PND or edema.  He denies a permanent urinary catheter in for obstruction.  Past Medical History  Diagnosis Date  . Mild aortic stenosis     last echo 08/2006  . Coronary artery disease     nonobstructive  . Myocardial infarction   . Hyperlipidemia   . Hypertension   . Vitamin B12 deficiency   . Neuropathy   . Allergic rhinitis   . OA (osteoarthritis)   . History of bladder cancer   . GERD (gastroesophageal reflux disease)   . Esophageal stricture     Current Outpatient Prescriptions  Medication Sig Dispense Refill  . aspirin 81 MG tablet Take 81 mg by mouth daily.        Marland Kitchen diltiazem (CARDIZEM CD) 180 MG 24 hr capsule Take 1 capsule (180 mg total) by mouth daily.  90 capsule  3  . fluticasone (FLONASE) 50 MCG/ACT nasal spray Place 2 sprays into the nose daily.      Marland Kitchen l-methylfolate-B6-B12 (METANX) 3-35-2 MG TABS Take 1 tablet by mouth daily.      . Multiple Vitamin (MULTIVITAMIN WITH MINERALS) TABS Take 1 tablet by mouth daily.      . nitroGLYCERIN (NITROSTAT) 0.4 MG SL tablet Place 1 tablet (0.4 mg total) under the tongue every 5 (five) minutes as needed.  75 tablet  3  . senna (SENOKOT) 8.6 MG TABS Take 3 tablets by mouth every other day. For constipation.      . Tamsulosin HCl (FLOMAX) 0.4 MG CAPS Take 0.4 mg by mouth 2 (two) times daily.       . trandolapril (MAVIK) 4 MG tablet Take 4 mg by mouth daily.      . temazepam (RESTORIL) 15 MG capsule Take 1 capsule (15 mg total) by mouth at bedtime as needed for sleep.  30 capsule  5    Allergies  Allergen Reactions  . Sulfamethoxazole    REACTION: unspecified    Family History  Problem Relation Age of Onset  . Coronary artery disease Brother   . Prostate cancer      first degree relative    History   Social History  . Marital Status: Married    Spouse Name: N/A    Number of Children: N/A  . Years of Education: N/A   Occupational History  . Not on file.   Social History Main Topics  . Smoking status: Former Smoker -- 1.0 packs/day for 10 years    Quit date: 03/14/1950  . Smokeless tobacco: Never Used  . Alcohol Use: No  . Drug Use: No  . Sexually Active: Not on file   Other Topics Concern  . 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    ROS ALL NEGATIVE EXCEPT THOSE NOTED IN HPI  PE  General Appearance: well developed, well nourished in no acute distress HEENT: symmetrical face, PERRLA, good dentition  Neck: no JVD, thyromegaly, or adenopathy, trachea midline Chest: symmetric without deformity Cardiac: PMI non-displaced, RRR, normal S1, S2, no  gallop, soft systolic murmur along the left sternal border, S2 splits Lung: clear to ausculation and percussion Vascular: all pulses full without bruits  Abdominal: nondistended, nontender, good bowel sounds, no HSM, no bruits Extremities: no cyanosis, clubbing or edema, no sign of DVT, no varicosities  Skin: normal color, no rashes Neuro: alert and oriented x 3, non-focal Pysch: normal affect  EKG Not repeated BMET    Component Value Date/Time   NA 131* 12/06/2011 0625   K 4.0 12/06/2011 0625   CL 97 12/06/2011 0625   CO2 25 12/06/2011 0625   GLUCOSE 97 12/06/2011 0625   BUN 23 12/06/2011 0625   CREATININE 1.33 12/06/2011 0625   CALCIUM 8.5 12/06/2011 0625   GFRNONAA 44* 12/06/2011 0625   GFRAA 51* 12/06/2011 0625    Lipid Panel     Component Value Date/Time   CHOL 267* 11/05/2008 0920   TRIG 224.0* 11/05/2008 0920   HDL 48.80 11/05/2008 0920   CHOLHDL 5 11/05/2008 0920   VLDL 44.8* 11/05/2008  0920    CBC    Component Value Date/Time   WBC 8.1 10/10/2011 1649   RBC 4.34 10/10/2011 1649   HGB 14.1 10/10/2011 1649   HCT 41.6 10/10/2011 1649   PLT 241.0 10/10/2011 1649   MCV 95.8 10/10/2011 1649   MCH 31.5 10/05/2011 0404   MCHC 33.8 10/10/2011 1649   RDW 13.1 10/10/2011 1649   LYMPHSABS 1.7 10/10/2011 1649   MONOABS 0.7 10/10/2011 1649   EOSABS 0.2 10/10/2011 1649   BASOSABS 0.0 10/10/2011 1649

## 2011-12-08 NOTE — Telephone Encounter (Signed)
Should pt take 1 or 2 tablets a day?

## 2011-12-08 NOTE — Patient Instructions (Addendum)
Your physician recommends that you continue on your current medications as directed. Please refer to the Current Medication list given to you today.   Your physician wants you to follow-up in:  As needed You will receive a reminder letter in the mail two months in advance. If you don't receive a letter, please call our office to schedule the follow-up appointment.

## 2011-12-08 NOTE — Assessment & Plan Note (Signed)
Asymptomatic. Continue secondary preventative therapy. 

## 2011-12-10 LAB — URINE CULTURE: Colony Count: >=100000

## 2011-12-11 NOTE — ED Notes (Signed)
+   Urine Chart sent to EDP office for review. 

## 2011-12-12 ENCOUNTER — Telehealth: Payer: Self-pay | Admitting: Family Medicine

## 2011-12-12 MED ORDER — NITROFURANTOIN MONOHYD MACRO 100 MG PO CAPS: 100.0000 mg | ORAL_CAPSULE | Freq: Two times a day (BID) | ORAL | Status: DC

## 2011-12-12 NOTE — Progress Notes (Signed)
Quick Note:  I spoke with pt's wife and sent script e-scribe. ______ 

## 2011-12-12 NOTE — Telephone Encounter (Signed)
I sent script e-scribe due to urine culture results and spoke with pt's wife.

## 2011-12-13 ENCOUNTER — Inpatient Hospital Stay (HOSPITAL_COMMUNITY)
Admission: EM | Admit: 2011-12-13 | Discharge: 2011-12-16 | DRG: 193 | Disposition: A | Discharge status: Home Health | Payer: Medicare Other | Attending: Internal Medicine | Admitting: Internal Medicine

## 2011-12-13 ENCOUNTER — Encounter (HOSPITAL_COMMUNITY): Payer: Self-pay | Admitting: Internal Medicine

## 2011-12-13 ENCOUNTER — Emergency Department (HOSPITAL_COMMUNITY): Payer: Medicare Other

## 2011-12-13 DIAGNOSIS — IMO0002 Reserved for concepts with insufficient information to code with codable children: Secondary | ICD-10-CM

## 2011-12-13 DIAGNOSIS — C679 Malignant neoplasm of bladder, unspecified: Secondary | ICD-10-CM

## 2011-12-13 DIAGNOSIS — J438 Other emphysema: Secondary | ICD-10-CM

## 2011-12-13 DIAGNOSIS — I252 Old myocardial infarction: Secondary | ICD-10-CM

## 2011-12-13 DIAGNOSIS — J449 Chronic obstructive pulmonary disease, unspecified: Secondary | ICD-10-CM

## 2011-12-13 DIAGNOSIS — R0902 Hypoxemia: Secondary | ICD-10-CM

## 2011-12-13 DIAGNOSIS — Z66 Do not resuscitate: Secondary | ICD-10-CM | POA: Diagnosis present

## 2011-12-13 DIAGNOSIS — R0602 Shortness of breath: Secondary | ICD-10-CM

## 2011-12-13 DIAGNOSIS — R1310 Dysphagia, unspecified: Secondary | ICD-10-CM

## 2011-12-13 DIAGNOSIS — Z8249 Family history of ischemic heart disease and other diseases of the circulatory system: Secondary | ICD-10-CM

## 2011-12-13 DIAGNOSIS — I2510 Atherosclerotic heart disease of native coronary artery without angina pectoris: Secondary | ICD-10-CM

## 2011-12-13 DIAGNOSIS — G589 Mononeuropathy, unspecified: Secondary | ICD-10-CM

## 2011-12-13 DIAGNOSIS — R42 Dizziness and giddiness: Secondary | ICD-10-CM

## 2011-12-13 DIAGNOSIS — J9600 Acute respiratory failure, unspecified whether with hypoxia or hypercapnia: Secondary | ICD-10-CM | POA: Diagnosis present

## 2011-12-13 DIAGNOSIS — K222 Esophageal obstruction: Secondary | ICD-10-CM

## 2011-12-13 DIAGNOSIS — K5900 Constipation, unspecified: Secondary | ICD-10-CM

## 2011-12-13 DIAGNOSIS — E785 Hyperlipidemia, unspecified: Secondary | ICD-10-CM

## 2011-12-13 DIAGNOSIS — R05 Cough: Secondary | ICD-10-CM

## 2011-12-13 DIAGNOSIS — R059 Cough, unspecified: Secondary | ICD-10-CM

## 2011-12-13 DIAGNOSIS — N139 Obstructive and reflux uropathy, unspecified: Secondary | ICD-10-CM | POA: Diagnosis present

## 2011-12-13 DIAGNOSIS — B9689 Other specified bacterial agents as the cause of diseases classified elsewhere: Secondary | ICD-10-CM | POA: Diagnosis present

## 2011-12-13 DIAGNOSIS — E440 Moderate protein-calorie malnutrition: Secondary | ICD-10-CM | POA: Diagnosis present

## 2011-12-13 DIAGNOSIS — M1990 Unspecified osteoarthritis, unspecified site: Secondary | ICD-10-CM

## 2011-12-13 DIAGNOSIS — Z23 Encounter for immunization: Secondary | ICD-10-CM

## 2011-12-13 DIAGNOSIS — J209 Acute bronchitis, unspecified: Secondary | ICD-10-CM

## 2011-12-13 DIAGNOSIS — J439 Emphysema, unspecified: Secondary | ICD-10-CM

## 2011-12-13 DIAGNOSIS — I10 Essential (primary) hypertension: Secondary | ICD-10-CM

## 2011-12-13 DIAGNOSIS — K219 Gastro-esophageal reflux disease without esophagitis: Secondary | ICD-10-CM

## 2011-12-13 DIAGNOSIS — H6120 Impacted cerumen, unspecified ear: Secondary | ICD-10-CM

## 2011-12-13 DIAGNOSIS — Z7982 Long term (current) use of aspirin: Secondary | ICD-10-CM

## 2011-12-13 DIAGNOSIS — G4700 Insomnia, unspecified: Secondary | ICD-10-CM

## 2011-12-13 DIAGNOSIS — Z9089 Acquired absence of other organs: Secondary | ICD-10-CM

## 2011-12-13 DIAGNOSIS — N179 Acute kidney failure, unspecified: Secondary | ICD-10-CM

## 2011-12-13 DIAGNOSIS — N401 Enlarged prostate with lower urinary tract symptoms: Secondary | ICD-10-CM

## 2011-12-13 DIAGNOSIS — I6529 Occlusion and stenosis of unspecified carotid artery: Secondary | ICD-10-CM

## 2011-12-13 DIAGNOSIS — Z79899 Other long term (current) drug therapy: Secondary | ICD-10-CM

## 2011-12-13 DIAGNOSIS — Z87891 Personal history of nicotine dependence: Secondary | ICD-10-CM

## 2011-12-13 DIAGNOSIS — N138 Other obstructive and reflux uropathy: Secondary | ICD-10-CM | POA: Diagnosis present

## 2011-12-13 DIAGNOSIS — I359 Nonrheumatic aortic valve disorder, unspecified: Secondary | ICD-10-CM

## 2011-12-13 DIAGNOSIS — Z9861 Coronary angioplasty status: Secondary | ICD-10-CM

## 2011-12-13 DIAGNOSIS — Z882 Allergy status to sulfonamides status: Secondary | ICD-10-CM

## 2011-12-13 DIAGNOSIS — N189 Chronic kidney disease, unspecified: Secondary | ICD-10-CM

## 2011-12-13 DIAGNOSIS — J189 Pneumonia, unspecified organism: Principal | ICD-10-CM

## 2011-12-13 DIAGNOSIS — E538 Deficiency of other specified B group vitamins: Secondary | ICD-10-CM

## 2011-12-13 DIAGNOSIS — J309 Allergic rhinitis, unspecified: Secondary | ICD-10-CM

## 2011-12-13 LAB — HEPATIC FUNCTION PANEL
ALT: 19 U/L (ref 0–53)
AST: 18 U/L (ref 0–37)
Albumin: 3.1 g/dL — ABNORMAL LOW (ref 3.5–5.2)
Alkaline Phosphatase: 47 U/L (ref 39–117)
Total Protein: 6.3 g/dL (ref 6.0–8.3)

## 2011-12-13 LAB — STREP PNEUMONIAE URINARY ANTIGEN: Strep Pneumo Urinary Antigen: NEGATIVE

## 2011-12-13 LAB — BASIC METABOLIC PANEL
BUN: 18 mg/dL (ref 6–23)
Calcium: 8.8 mg/dL (ref 8.4–10.5)
Chloride: 100 mEq/L (ref 96–112)
Creatinine, Ser: 1.34 mg/dL (ref 0.50–1.35)
GFR calc Af Amer: 51 mL/min — ABNORMAL LOW (ref >90)

## 2011-12-13 LAB — CBC
HCT: 37.5 % — ABNORMAL LOW (ref 39.0–52.0)
MCHC: 33.3 g/dL (ref 30.0–36.0)
MCV: 93.1 fL (ref 78.0–100.0)
RDW: 12.7 % (ref 11.5–15.5)

## 2011-12-13 LAB — URINALYSIS, ROUTINE W REFLEX MICROSCOPIC
Bilirubin Urine: NEGATIVE
Protein, ur: 100 mg/dL — AB
Urobilinogen, UA: 0.2 mg/dL (ref 0.0–1.0)

## 2011-12-13 LAB — URINE MICROSCOPIC-ADD ON

## 2011-12-13 LAB — TROPONIN I: Troponin I: <0.3 ng/mL (ref <0.30)

## 2011-12-13 MED ORDER — PREDNISONE 50 MG PO TABS
80.0000 mg | ORAL_TABLET | Freq: Two times a day (BID) | ORAL | Status: DC
  Administered 2011-12-13 – 2011-12-14 (×2): 80 mg via ORAL
  Filled 2011-12-13 (×4): qty 1

## 2011-12-13 MED ORDER — NITROGLYCERIN 0.4 MG SL SUBL
0.4000 mg | SUBLINGUAL_TABLET | SUBLINGUAL | Status: DC | PRN
Start: 2011-12-13 — End: 2011-12-16

## 2011-12-13 MED ORDER — IPRATROPIUM BROMIDE 0.02 % IN SOLN
0.5000 mg | Freq: Once | RESPIRATORY_TRACT | Status: AC
  Administered 2011-12-13: 0.5 mg via RESPIRATORY_TRACT

## 2011-12-13 MED ORDER — TEMAZEPAM 15 MG PO CAPS
15.0000 mg | ORAL_CAPSULE | Freq: Every evening | ORAL | Status: DC | PRN
  Administered 2011-12-13 – 2011-12-15 (×3): 15 mg via ORAL
  Filled 2011-12-13 (×3): qty 1

## 2011-12-13 MED ORDER — ALBUTEROL SULFATE (5 MG/ML) 0.5% IN NEBU
5.0000 mg | INHALATION_SOLUTION | Freq: Once | RESPIRATORY_TRACT | Status: AC
  Administered 2011-12-13: 5 mg via RESPIRATORY_TRACT
  Filled 2011-12-13 (×2): qty 0.5

## 2011-12-13 MED ORDER — ADULT MULTIVITAMIN W/MINERALS CH
1.0000 | ORAL_TABLET | Freq: Every day | ORAL | Status: DC
  Administered 2011-12-13 – 2011-12-16 (×4): 1 via ORAL
  Filled 2011-12-13 (×4): qty 1

## 2011-12-13 MED ORDER — FLUTICASONE-SALMETEROL 250-50 MCG/DOSE IN AEPB
1.0000 | INHALATION_SPRAY | Freq: Two times a day (BID) | RESPIRATORY_TRACT | Status: DC
  Administered 2011-12-13 – 2011-12-16 (×6): 1 via RESPIRATORY_TRACT
  Filled 2011-12-13: qty 14

## 2011-12-13 MED ORDER — TAMSULOSIN HCL 0.4 MG PO CAPS
0.4000 mg | ORAL_CAPSULE | Freq: Two times a day (BID) | ORAL | Status: DC
  Administered 2011-12-13 – 2011-12-16 (×7): 0.4 mg via ORAL
  Filled 2011-12-13 (×8): qty 1

## 2011-12-13 MED ORDER — PIPERACILLIN-TAZOBACTAM 3.375 G IVPB
3.3750 g | Freq: Three times a day (TID) | INTRAVENOUS | Status: DC
  Administered 2011-12-13 – 2011-12-15 (×6): 3.375 g via INTRAVENOUS
  Filled 2011-12-13 (×8): qty 50

## 2011-12-13 MED ORDER — SENNA 8.6 MG PO TABS
3.0000 | ORAL_TABLET | ORAL | Status: DC
  Administered 2011-12-13: 25.8 mg via ORAL
  Filled 2011-12-13: qty 2
  Filled 2011-12-13: qty 1

## 2011-12-13 MED ORDER — VANCOMYCIN HCL IN DEXTROSE 1-5 GM/200ML-% IV SOLN
1000.0000 mg | INTRAVENOUS | Status: DC
  Administered 2011-12-14: 1000 mg via INTRAVENOUS
  Filled 2011-12-13: qty 200

## 2011-12-13 MED ORDER — L-METHYLFOLATE-B6-B12 3-35-2 MG PO TABS
1.0000 | ORAL_TABLET | Freq: Every day | ORAL | Status: DC
  Administered 2011-12-13 – 2011-12-16 (×4): 1 via ORAL
  Filled 2011-12-13 (×4): qty 1

## 2011-12-13 MED ORDER — PREDNISONE 20 MG PO TABS
60.0000 mg | ORAL_TABLET | Freq: Once | ORAL | Status: AC
  Administered 2011-12-13: 60 mg via ORAL
  Filled 2011-12-13: qty 3

## 2011-12-13 MED ORDER — PIPERACILLIN-TAZOBACTAM 3.375 G IVPB
3.3750 g | Freq: Once | INTRAVENOUS | Status: AC
  Administered 2011-12-13: 3.375 g via INTRAVENOUS
  Filled 2011-12-13: qty 50

## 2011-12-13 MED ORDER — VANCOMYCIN HCL IN DEXTROSE 1-5 GM/200ML-% IV SOLN
1000.0000 mg | Freq: Once | INTRAVENOUS | Status: AC
  Administered 2011-12-13: 1000 mg via INTRAVENOUS
  Filled 2011-12-13: qty 200

## 2011-12-13 MED ORDER — TRANDOLAPRIL 4 MG PO TABS
4.0000 mg | ORAL_TABLET | Freq: Every day | ORAL | Status: DC
  Administered 2011-12-13 – 2011-12-16 (×4): 4 mg via ORAL
  Filled 2011-12-13 (×4): qty 1

## 2011-12-13 MED ORDER — ALBUTEROL SULFATE (5 MG/ML) 0.5% IN NEBU: 2.5000 mg | INHALATION_SOLUTION | Freq: Four times a day (QID) | RESPIRATORY_TRACT | Status: DC | PRN

## 2011-12-13 MED ORDER — INFLUENZA VIRUS VACC SPLIT PF IM SUSP
0.5000 mL | INTRAMUSCULAR | Status: AC
  Administered 2011-12-14: 0.5 mL via INTRAMUSCULAR
  Filled 2011-12-13: qty 0.5

## 2011-12-13 MED ORDER — SODIUM CHLORIDE 0.9 % IV SOLN
INTRAVENOUS | Status: AC
  Administered 2011-12-13: 13:00:00 via INTRAVENOUS

## 2011-12-13 MED ORDER — PANTOPRAZOLE SODIUM 40 MG PO TBEC
40.0000 mg | DELAYED_RELEASE_TABLET | Freq: Every day | ORAL | Status: DC
  Administered 2011-12-13 – 2011-12-16 (×4): 40 mg via ORAL
  Filled 2011-12-13 (×4): qty 1

## 2011-12-13 MED ORDER — ASPIRIN EC 81 MG PO TBEC
81.0000 mg | DELAYED_RELEASE_TABLET | Freq: Every day | ORAL | Status: DC
  Administered 2011-12-13 – 2011-12-16 (×4): 81 mg via ORAL
  Filled 2011-12-13 (×4): qty 1

## 2011-12-13 MED ORDER — DILTIAZEM HCL ER COATED BEADS 180 MG PO CP24
180.0000 mg | ORAL_CAPSULE | Freq: Every day | ORAL | Status: DC
  Administered 2011-12-13 – 2011-12-16 (×4): 180 mg via ORAL
  Filled 2011-12-13 (×4): qty 1

## 2011-12-13 MED ORDER — IPRATROPIUM BROMIDE 0.02 % IN SOLN
RESPIRATORY_TRACT | Status: AC
  Filled 2011-12-13: qty 2.5

## 2011-12-13 NOTE — ED Notes (Signed)
Pt's wife states pt wants to be a DNR, when talking w/ pt, pt denies wanting to be a DNR, states he wants to be brought back to life, admitting MD at bedside now.

## 2011-12-13 NOTE — H&P (Signed)
[facility] History and Physical  OTTAVIO NOREM ZOX:[id number] DOB: 08/07/1916 DOA: 12/13/2011  Referring physician: Dr. Patria Mane PCP: Nelwyn Salisbury, MD   Chief Complaint: SOB, cough and hypoxia  HPI: Thomas [name] is a 78 y.o. male with the past medical history significant for coronary artery disease, history of a past myocardial infarction, hypertension, BPH, gastroesophageal reflux disease and history of bladder cancer; came to the hospital complaining of increased shortness of breath, cough and wheezing. Patient reports that he has not been feeling well for the last 1-2 days with a sudden worsening of shortness of breath and cough the night piror to admission. Patient denies any chest pain, any fever, no abdominal pain, no nausea/vomiting, this a positive whitish sputum production along with a cough but no hemoptysis. Per patient's wife present on examination, she reports decreased fluid intake and appetite along with his symptoms. In the emergency department patient was found hypoxic with oxygen saturation of 84%, after albuterol nebulization and oxygen supplementation 2 L his oxygen saturation improved into the 9596%; a chest x-ray demonstrated by basilar opacities/infiltrates with concerns for pneumonia. WBCs were within normal limits but given his age, symptoms, chest x-ray findings and hypoxia [facility] has been called to admit the patient for further evaluation and treatment.  Of note patient had a remote history of more than 30 years of tobacco abuse; has never had a pulmonary function tests done or visit a pulmonologist. There is chronic emphysematous changes on x-ray and the patient has had intermittent wheezing along with his symptoms.  Review of Systems:  Negative except as otherwise mentioned on history of present illness.  Past Medical History  Diagnosis Date  . Mild aortic stenosis     last echo 08/2006  . Coronary artery disease     nonobstructive  . Myocardial  infarction   . Hyperlipidemia   . Hypertension   . Vitamin B12 deficiency   . Neuropathy   . Allergic rhinitis   . OA (osteoarthritis)   . History of bladder cancer   . GERD (gastroesophageal reflux disease)   . Esophageal stricture    Past Surgical History  Procedure Date  . Coronary angioplasty   . Appendectomy   . Cholecystectomy   . Tonsillectomy   . Sigmoidoscopy 1999  . Colonoscopy   . Bilateral elbow surgery   . Left knee replacement   . Carotid endarterectomy 08/2008  . Esophagogastroduodenoscopy     dilation   Social History:  reports that he quit smoking about 61 years ago. He has never used smokeless tobacco. He reports that he does not drink alcohol or use illicit drugs. patient came from home at this moment has not require assistance with activities of daily living.   Allergies  Allergen Reactions  . Sulfamethoxazole     REACTION: unspecified    Family History  Problem Relation Age of Onset  . Coronary artery disease Brother   . Prostate cancer      first degree relative    Prior to Admission medications   Medication Sig Start Date End Date Taking? Authorizing Provider  aspirin EC 81 MG tablet Take 81 mg by mouth daily.   Yes Historical Provider, MD  diltiazem (CARDIZEM CD) 180 MG 24 hr capsule Take 1 capsule (180 mg total) by mouth daily. 10/31/11 10/30/12 Yes Nelwyn Salisbury, MD  fluticasone [facility]) 50 MCG/ACT nasal spray Place 2 sprays into the nose daily. 06/21/11 06/20/12 Yes Nelwyn Salisbury, MD  Ibuprofen-Diphenhydramine HCl (ADVIL PM)  200-25 MG CAPS Take 1 tablet by mouth at bedtime.   Yes Historical Provider, MD  l-methylfolate-B6-B12 (METANX) 3-35-2 MG TABS Take 1 tablet by mouth daily. 10/06/11  Yes Gwenyth Bender, NP  Multiple Vitamin (MULTIVITAMIN WITH MINERALS) TABS Take 1 tablet by mouth daily.   Yes Historical Provider, MD  nitrofurantoin, macrocrystal-monohydrate, (MACROBID) 100 MG capsule Take 1 capsule (100 mg total) by mouth 2 (two) times daily.  12/12/11  Yes Nelwyn Salisbury, MD  nitroGLYCERIN (NITROSTAT) 0.4 MG SL tablet Place 1 tablet (0.4 mg total) under the tongue every 5 (five) minutes as needed. 12/03/10  Yes Nelwyn Salisbury, MD  senna (SENOKOT) 8.6 MG TABS Take 3 tablets by mouth every other day. For constipation.   Yes Historical Provider, MD  Tamsulosin HCl (FLOMAX) 0.4 MG CAPS Take 0.4 mg by mouth 2 (two) times daily.  12/03/10  Yes Nelwyn Salisbury, MD  trandolapril (MAVIK) 4 MG tablet Take 4 mg by mouth daily. 02/21/11 02/21/12 Yes Nelwyn Salisbury, MD  temazepam (RESTORIL) 15 MG capsule Take 1 capsule (15 mg total) by mouth at bedtime as needed for sleep. 11/01/11 12/01/11  Nelwyn Salisbury, MD   Physical Exam: Daiva Eves:   12/13/11 4098 12/13/11 0532 12/13/11 0545 12/13/11 0554  BP:      Pulse:   104   Temp:      TempSrc:      Resp:   22   SpO2: 92% 85% 94% 96%     General:  ill appearing 78 year old white male, able to speak in full sentences, had a period in, feeling weak and beat up  Eyes: PERRLA, EOMI, no icterus, no nystagmus  ENT: No erythema or exudates, mild dryness of MM; dentures in place; no discharges from ears or nostrils  Neck: no JVD, no thyromegaly  Cardiovascular: mild tachycardia, positive SEM; no rubs or gallops  Respiratory: decreased BS bilaterally, positive mild expiratory wheezing and also scattered rhonchi   Abdomen: soft, NT, ND; positive BS  Skin: No rashes or petechiae at present on exam.  Musculoskeletal: no joint swelling or erythema  Psychiatric: no SI, mood stable  Neurologic: AAOX3; no focal deficit; CN intact; MS 3-4/5 due to poor effort and weakness  Labs on Admission:  Basic Metabolic Panel:  Lab 12/13/11 1191  NA 135  K 3.7  CL 100  CO2 24  GLUCOSE 137*  BUN 18  CREATININE 1.34  CALCIUM 8.8  MG --  PHOS --   CBC:  Lab 12/13/11 0420  WBC 9.6  NEUTROABS --  HGB 12.5*  HCT 37.5*  MCV 93.1  PLT 190   Cardiac Enzymes:  Lab 12/13/11 0510  CKTOTAL --  CKMB --    CKMBINDEX --  TROPONINI <0.30    Radiological Exams on Admission: Dg Chest 2 View (if Patient Has Fever And/or Copd)  12/13/2011  *RADIOLOGY REPORT*  Clinical Data: Shortness of breath  CHEST - 2 VIEW  Comparison: 10/03/2011  Findings: Emphysematous changes.  Mild lower lobe opacities. Aortic tortuosity and scattered atherosclerosis.  Heart size upper normal.  No definite pleural effusion or pneumothorax.  Multilevel degenerative changes.  IMPRESSION: Mild lung base opacities; atelectasis, aspiration, or infiltrate.  Emphysematous changes.   Original Report Authenticated By: Waneta Martins, M.D.     EKG: Mild tachycardia, nonspecific lateral ST changes, normal QRS, normal PR and QT.  Assessment/Plan 1-Shortness of breath, cough and hypoxia: Most likely secondary to pneumonia (patient with in the hospital in the last 90  days, making this as healthcare assess her pneumonia); also with wheezing and emphysematous changes of his x-ray may have a component of COPD (no formal diagnosis or PFTs ever done, patient with remote history of more than 30 years tobacco abuse ). Patient also have a history of coronary artery disease but denies chest pain, will be less likely that he is having ACS as the cause of his shortness of breath. Patient will be admitted to telemetry, will be started on vancomycin and Zosyn for treatment of healthcare assess her pneumonia, when necessary nebulizers, will empirically start him on Advair twice a day, will also use prednisone (initially twice a day, with inpatient taper once his breathing status and wheezing improved); will check blood cultures, urine culture, a strep urine Legionella antigen; patient will be started on supplemental oxygen with inpatient to keep oxygen saturation above 90%. Prior to discharge we'll evaluate the need of home oxygen.  2-BLADDER CANCER: Followed by urology as an outpatient. At this point requiring indwelling catheter. He is having mild  hematuria in the urine but appears to be no new. Hemoglobin is a stable. Will avoid heparin products.  3-HYPERLIPIDEMIA: Patient currently not taking any medications for cholesterol control, given his age doubt that statins will provide any benefit or protection at this point. Would recommend heart healthy diet.  4-HYPERTENSION: stable and well controlled. Will continue diltiazem.  5-CORONARY ARTERY DISEASE: Currently without any chest pain. Due to the history of coronary disease in the past and presentation of shortness of breath will cycle cardiac enzymes, will admit to telemetry bed and will continue his aspirin.  6-GERD: Continue PPI.  7-BENIGN PROSTATIC HYPERTROPHY, WITH OBSTRUCTION: Patient with Foley catheter in place and plans for voiding trials in October at the urologist office; continue Flomax.  8-Emphysema: Chronic); changes on his x-ray, no formal PFTs ever done or pulmonologist deficit. At this point due to decreased breath sounds and wheezing will go ahead and use Advair twice a day. Will be important to arrange followup visit with pulmonology service at discharge; for further evaluation and long-term medication plans as an outpatient.  9-Insomnia:continue restoril.   Code Status: DNR Family Communication: Wife at bedtime Disposition Plan: Depending clinical evolution; patient will like to go home when medically stable for further history feeling weak and deconditioned (will follow physical therapy recommendations for possible physical rehabilitation short-term SNF placement if needed)  Time spent: More than 30 minutes  Xavius Spadafore [facility] Pager [phone]  If 7PM-7AM, please contact night-coverage [email or web address] Password [email or web address] 12/13/2011, 8:32 AM

## 2011-12-13 NOTE — ED Notes (Signed)
[id number]<NF> Expected date:<BR> Expected time:<BR> Means of arrival:<BR> Comments:<BR> EMS-elderly male with respiratory distress

## 2011-12-13 NOTE — Progress Notes (Signed)
ANTIBIOTIC CONSULT NOTE - INITIAL  Pharmacy Consult for Vanco and Zosyn Indication: R/O PNA  Allergies  Allergen Reactions  . Sulfamethoxazole     REACTION: unspecified    Patient Measurements:    Vital Signs: Temp: 98.8 F (37.1 C) (10/01 0835) Temp src: Oral (10/01 0835) BP: 106/44 mmHg (10/01 0835) Pulse Rate: 89  (10/01 0835) Intake/Output from previous day:   Intake/Output from this shift:    Labs:  [facility] 12/13/11 0420  WBC 9.6  HGB 12.5*  PLT 190  LABCREA --  CREATININE 1.34   The CrCl is unknown because both a height and weight (above a minimum accepted value) are required for this calculation. No results found for this basename: VANCOTROUGH:2,VANCOPEAK:2,VANCORANDOM:2,GENTTROUGH:2,GENTPEAK:2,GENTRANDOM:2,TOBRATROUGH:2,TOBRAPEAK:2,TOBRARND:2,AMIKACINPEAK:2,AMIKACINTROU:2,AMIKACIN:2, in the last 72 hours   Microbiology: Recent Results (from the past 720 hour(s))  URINE CULTURE     Status: Normal   Collection Time   12/06/11  5:59 AM      Component Value Range Status Comment   Specimen Description URINE, CLEAN CATCH   Final    Special Requests NONE   Final    Culture  Setup Time 12/07/2011 02:00   Final    Colony Count >=100,000 COLONIES/ML   Final    Culture ENTEROCOCCUS SPECIES   Final    Report Status 12/10/2011 FINAL   Final    Organism ID, Bacteria ENTEROCOCCUS SPECIES   Final     Medical History: Past Medical History  Diagnosis Date  . Mild aortic stenosis     last echo 08/2006  . Coronary artery disease     nonobstructive  . Myocardial infarction   . Hyperlipidemia   . Hypertension   . Vitamin B12 deficiency   . Neuropathy   . Allergic rhinitis   . OA (osteoarthritis)   . History of bladder cancer   . GERD (gastroesophageal reflux disease)   . Esophageal stricture     Medications:  Anti-infectives     Start     Dose/Rate Route Frequency Ordered Stop   12/13/11 0715   vancomycin (VANCOCIN) IVPB 1000 mg/200 mL premix        1,000  mg 200 mL/hr over 60 Minutes Intravenous  Once 12/13/11 0645 12/13/11 0810   12/13/11 0715   piperacillin-tazobactam (ZOSYN) IVPB 3.375 g        3.375 g 100 mL/hr over 30 Minutes Intravenous  Once 12/13/11 0645           Assessment: 78 yo M admitted 12/13/11 with r/o HCAP vs COPD exacerbation. Reports have CAP in July 2013 and took abx. Received Vanco 1g x1 ~ 8am in ER. Zosyn 3.375g IV x1 given ~ 08:30am (30 min infusion, not EI infusion). CrCl(n) ~ 78ml/min. Wt=67 kg on 12/08/11. SCr=1.34.  Goal of Therapy:  Vancomycin trough level 15-20 mcg/ml  Plan:  1) Zosyn 3.375g IV q4h (EI) - next dose due at 14:00 2) Vanco 1g IV q24h - next due 10/2 at 8am 3) F/U updated height/weigh info.   Darrol Angel, PharmD Pager: [phone] 12/13/2011,8:53 AM

## 2011-12-13 NOTE — ED Provider Notes (Addendum)
History     CSN: [id number]  Arrival date & time 12/13/11  4098   First MD Initiated Contact with Patient 12/13/11 [phone]      Chief Complaint  Patient presents with  . Shortness of Breath     Patient is a 78 y.o. male presenting with shortness of breath.  Shortness of Breath  Associated symptoms include shortness of breath.   The patient reports developing worsening shortness of breath last night over the past 4-5 hours.  He presents to the ER tachypneic with a respiratory rate of around 37.  He is given an albuterol treatment on arrival reports that his breathing feels better.  His had no recent fevers or chills.  He reports cough.  He had a bout of community-acquired pneumonia in July of 2013 for which a history of antibiotics and was discharged home with oxygen.  He reports his been weaned off his oxygen altogether.  He denies recent long travel or surgery.  His had no unilateral leg swelling.  He denies a history of DVT or pulmonary embolism.  He has no chest pain at this time.  He denies orthopnea or dyspnea on exertion.  He does report a history of emphysema but has never seen a pulmonologist.   Past Medical History  Diagnosis Date  . Mild aortic stenosis     last echo 08/2006  . Coronary artery disease     nonobstructive  . Myocardial infarction   . Hyperlipidemia   . Hypertension   . Vitamin B12 deficiency   . Neuropathy   . Allergic rhinitis   . OA (osteoarthritis)   . History of bladder cancer   . GERD (gastroesophageal reflux disease)   . Esophageal stricture     Past Surgical History  Procedure Date  . Coronary angioplasty   . Appendectomy   . Cholecystectomy   . Tonsillectomy   . Sigmoidoscopy 1999  . Colonoscopy   . Bilateral elbow surgery   . Left knee replacement   . Carotid endarterectomy 08/2008  . Esophagogastroduodenoscopy     dilation    Family History  Problem Relation Age of Onset  . Coronary artery disease Brother   . Prostate cancer     first degree relative    History  Substance Use Topics  . Smoking status: Former Smoker -- 1.0 packs/day for 10 years    Quit date: 03/14/1950  . Smokeless tobacco: Never Used  . Alcohol Use: No      Review of Systems  Respiratory: Positive for shortness of breath.   All other systems reviewed and are negative.    Allergies  Sulfamethoxazole  Home Medications   Current Outpatient Rx  Name Route Sig Dispense Refill  . ASPIRIN EC 81 MG PO TBEC Oral Take 81 mg by mouth daily.    Marland Kitchen DILTIAZEM HCL ER COATED BEADS 180 MG PO CP24 Oral Take 1 capsule (180 mg total) by mouth daily. 90 capsule 3  . FLUTICASONE PROPIONATE 50 MCG/ACT NA SUSP Nasal Place 2 sprays into the nose daily.    Drema Balzarine HCL 200-25 MG PO CAPS Oral Take 1 tablet by mouth at bedtime.    . L-METHYLFOLATE-B6-B12 3-35-2 MG PO TABS Oral Take 1 tablet by mouth daily.    . ADULT MULTIVITAMIN W/MINERALS CH Oral Take 1 tablet by mouth daily.    Marland Kitchen NITROFURANTOIN MONOHYD MACRO 100 MG PO CAPS Oral Take 1 capsule (100 mg total) by mouth 2 (two) times daily. 20 capsule 0  .  NITROGLYCERIN 0.4 MG SL SUBL Sublingual Place 1 tablet (0.4 mg total) under the tongue every 5 (five) minutes as needed. 75 tablet 3    Script called in to [facility] [phone] per pt reque ...  . SENNA 8.6 MG PO TABS Oral Take 3 tablets by mouth every other day. For constipation.    Marland Kitchen TAMSULOSIN HCL 0.4 MG PO CAPS Oral Take 0.4 mg by mouth 2 (two) times daily.     . TRANDOLAPRIL 4 MG PO TABS Oral Take 4 mg by mouth daily.    Marland Kitchen TEMAZEPAM 15 MG PO CAPS Oral Take 1 capsule (15 mg total) by mouth at bedtime as needed for sleep. 30 capsule 5    BP 124/75  Pulse 104  Temp 97.6 F (36.4 C) (Oral)  Resp 22  SpO2 96%  Physical Exam  Nursing note and vitals reviewed. Constitutional: He is oriented to person, place, and time. He appears well-developed and well-nourished.  HENT:  Head: Normocephalic and atraumatic.  Eyes: EOM are normal.    Neck: Normal range of motion.  Cardiovascular: Normal rate, regular rhythm, normal heart sounds and intact distal pulses.   Pulmonary/Chest: Effort normal and breath sounds normal. No respiratory distress. He has no wheezes.  Abdominal: Soft. He exhibits no distension. There is no tenderness.  Musculoskeletal: Normal range of motion.  Neurological: He is alert and oriented to person, place, and time.  Skin: Skin is warm and dry.  Psychiatric: He has a normal mood and affect. Judgment normal.    ED Course  Procedures (including critical care time)   Date: 12/13/2011  Rate: 116  Rhythm: sinus tachycardia  QRS Axis: normal  Intervals: normal  ST/T Wave abnormalities: Nonspecific lateral ST changes  Conduction Disutrbances: none  Narrative Interpretation:   Old EKG Reviewed: Nonspecific ST changes as compared to prior EKG    Labs Reviewed  BASIC METABOLIC PANEL - Abnormal; Notable for the following:    Glucose, Bld 137 (*)     GFR calc non Af Amer 44 (*)     GFR calc Af Amer 51 (*)     All other components within normal limits  CBC - Abnormal; Notable for the following:    RBC 4.03 (*)     Hemoglobin 12.5 (*)     HCT 37.5 (*)     All other components within normal limits  URINALYSIS, ROUTINE W REFLEX MICROSCOPIC - Abnormal; Notable for the following:    Color, Urine AMBER (*)  BIOCHEMICALS MAY BE AFFECTED BY COLOR   APPearance CLOUDY (*)     Hgb urine dipstick LARGE (*)     Ketones, ur TRACE (*)     Protein, ur 100 (*)     Leukocytes, UA MODERATE (*)     All other components within normal limits  URINE MICROSCOPIC-ADD ON - Abnormal; Notable for the following:    Bacteria, UA FEW (*)     All other components within normal limits  LACTIC ACID, PLASMA  TROPONIN I  POCT I-STAT TROPONIN I  CULTURE, BLOOD (ROUTINE X 2)  CULTURE, BLOOD (ROUTINE X 2)  URINE CULTURE   Dg Chest 2 View (if Patient Has Fever And/or Copd)  12/13/2011  *RADIOLOGY REPORT*  Clinical Data:  Shortness of breath  CHEST - 2 VIEW  Comparison: 10/03/2011  Findings: Emphysematous changes.  Mild lower lobe opacities. Aortic tortuosity and scattered atherosclerosis.  Heart size upper normal.  No definite pleural effusion or pneumothorax.  Multilevel degenerative changes.  IMPRESSION:  Mild lung base opacities; atelectasis, aspiration, or infiltrate.  Emphysematous changes.   Original Report Authenticated By: Waneta Martins, M.D.     I personally reviewed the imaging tests through PACS system I reviewed available ER/hospitalization records thought the EMR    1. Hypoxia   2. HCAP (healthcare-associated pneumonia)       MDM  The patient's had significant improvement in his breathing after an albuterol treatment however his room air sat was taken off oxygen is 85%.  He is placed back on oxygen and the patient will need admission the hospital for hypoxia.  The fact that his breathing is significantly better after albuterol makes me suspect a lot of this may represent COPD exacerbation however his hypoxia may be representative of a new pneumonia as the patient does have mild lower lobe opacities bilaterally concerning for possible infiltrate.  His count is normal this time.  He is afebrile.  Thank mycin and Zosyn given as the patient is on broad-spectrum antibiotics with past 3 months therefore this never pursued healthcare associated pneumonia.        Lyanne Co, MD 12/13/11 0710  Lyanne Co, MD 12/13/11 [phone]

## 2011-12-13 NOTE — ED Notes (Signed)
As per EMS pt c/o SOB for 4 hours. Pt is in acute distress. Hx COPD.Respatory notified.

## 2011-12-14 DIAGNOSIS — I359 Nonrheumatic aortic valve disorder, unspecified: Secondary | ICD-10-CM

## 2011-12-14 DIAGNOSIS — J309 Allergic rhinitis, unspecified: Secondary | ICD-10-CM

## 2011-12-14 DIAGNOSIS — J209 Acute bronchitis, unspecified: Secondary | ICD-10-CM

## 2011-12-14 LAB — LEGIONELLA ANTIGEN, URINE

## 2011-12-14 LAB — CBC
HCT: 31.8 % — ABNORMAL LOW (ref 39.0–52.0)
MCHC: 34.3 g/dL (ref 30.0–36.0)
RDW: 13.1 % (ref 11.5–15.5)

## 2011-12-14 LAB — BASIC METABOLIC PANEL
BUN: 26 mg/dL — ABNORMAL HIGH (ref 6–23)
GFR calc Af Amer: 44 mL/min — ABNORMAL LOW (ref >90)
GFR calc non Af Amer: 38 mL/min — ABNORMAL LOW (ref >90)
Potassium: 4 mEq/L (ref 3.5–5.1)

## 2011-12-14 LAB — TROPONIN I: Troponin I: <0.3 ng/mL (ref <0.30)

## 2011-12-14 MED ORDER — ALBUTEROL SULFATE (5 MG/ML) 0.5% IN NEBU: 2.5000 mg | INHALATION_SOLUTION | RESPIRATORY_TRACT | Status: DC | PRN

## 2011-12-14 MED ORDER — PREDNISONE 50 MG PO TABS
80.0000 mg | ORAL_TABLET | Freq: Every day | ORAL | Status: DC
  Administered 2011-12-15: 80 mg via ORAL
  Filled 2011-12-14: qty 1

## 2011-12-14 MED ORDER — VANCOMYCIN HCL 1000 MG IV SOLR
750.0000 mg | INTRAVENOUS | Status: DC
  Administered 2011-12-15: 750 mg via INTRAVENOUS
  Filled 2011-12-14: qty 750

## 2011-12-14 MED ORDER — BISACODYL 5 MG PO TBEC
5.0000 mg | DELAYED_RELEASE_TABLET | Freq: Every day | ORAL | Status: DC | PRN
  Administered 2011-12-14: 5 mg via ORAL
  Filled 2011-12-14: qty 1

## 2011-12-14 MED ORDER — BOOST PLUS PO LIQD
237.0000 mL | Freq: Three times a day (TID) | ORAL | Status: DC
  Administered 2011-12-14 – 2011-12-16 (×5): 237 mL via ORAL
  Filled 2011-12-14 (×8): qty 237

## 2011-12-14 NOTE — Care Management Note (Addendum)
    Page 1 of 2   12/16/2011     1:11:06 PM   CARE MANAGEMENT NOTE 12/16/2011  Patient:  Thomas [name], Thomas [name]   Account Number:  [id number]  Date Initiated:  12/14/2011  Documentation initiated by:  Lanier Clam  Subjective/Objective Assessment:   ADMITTED W/SOB.PNA/HYPOXIA.WU:[id number] CA.     Action/Plan:   FROM HOME W/FAMILY.HAS CANE,RW.   Anticipated DC Date:  12/20/2011   Anticipated DC Plan:  HOME W HOME HEALTH SERVICES      DC Planning Services  CM consult      Choice offered to / List presented to:  C-1 Patient        HH arranged  HH-1 RN  HH-2 PT      Thomas [name] agency  Thomas [name]   Status of service:  Completed, signed off Medicare Important Message given?  NA - LOS <3 / Initial given by admissions (If response is "NO", the following Medicare IM given date fields will be blank) Date Medicare IM given:   Date Additional Medicare IM given:    Discharge Disposition:  HOME W HOME HEALTH SERVICES  Per UR Regulation:  Reviewed for med. necessity/level of care/duration of stay  If discussed at Long Length of Stay Meetings, dates discussed:    Comments:  12/16/11 Thomas Kloth RN,BSN [facility] [phone] [facility] Thomas [name] (LIASON) AWARE OF D/C TODAY,WILL ALSO SET UP MEALS ON WHEELS VIA HHRN.  12/15/11 Thomas Sanford RN,BSN [place] [phone] PT-HH.NOTED PATIENT DECLINES HH.  12/14/11 Thomas Arvin RN,BSN [facility] [phone] AWAIT PT RECOMMENDATIONS.

## 2011-12-14 NOTE — Progress Notes (Signed)
INITIAL ADULT NUTRITION ASSESSMENT Date: 12/14/2011   Time: 12:11 PM Reason for Assessment: Nutrition Risk for unintentional weight loss and decreased intake.  ASSESSMENT: Male 78 y.o.  Dx: Shortness of breath  Hx: bladder Ca Past Medical History  Diagnosis Date  . Mild aortic stenosis     last echo 08/2006  . Coronary artery disease     nonobstructive  . Myocardial infarction   . Hyperlipidemia   . Hypertension   . Vitamin B12 deficiency   . Neuropathy   . Allergic rhinitis   . OA (osteoarthritis)   . History of bladder cancer   . GERD (gastroesophageal reflux disease)   . Esophageal stricture    Past Surgical History  Procedure Date  . Coronary angioplasty   . Appendectomy   . Cholecystectomy   . Tonsillectomy   . Sigmoidoscopy 1999  . Colonoscopy   . Bilateral elbow surgery   . Left knee replacement   . Carotid endarterectomy 08/2008  . Esophagogastroduodenoscopy     dilation    Related Meds:     . aspirin EC  81 mg Oral Daily  . diltiazem  180 mg Oral Daily  . Fluticasone-Salmeterol  1 puff Inhalation BID  . influenza  inactive virus vaccine  0.5 mL Intramuscular Tomorrow-1000  . ipratropium      . l-methylfolate-B6-B12  1 tablet Oral Daily  . multivitamin with minerals  1 tablet Oral Daily  . pantoprazole  40 mg Oral Q1200  . piperacillin-tazobactam (ZOSYN)  IV  3.375 g Intravenous Q8H  . predniSONE  80 mg Oral BID WC  . senna  3 tablet Oral QODAY  . Tamsulosin HCl  0.4 mg Oral BID  . trandolapril  4 mg Oral Daily  . vancomycin  1,000 mg Intravenous Q24H     Ht: 6' (182.9 cm)  Wt: 143 lb 14.4 oz (65.273 kg)  Ideal Wt: 80.9 kg % Ideal Wt: 81   Usual Wt: 167# in 2011 and since a young man Wt Readings from Last 10 Encounters:  12/13/11 143 lb 14.4 oz (65.273 kg)  12/08/11 147 lb 6.4 oz (66.86 kg)  12/05/11 147 lb (66.679 kg)  10/10/11 152 lb (68.947 kg)  10/04/11 154 lb 12.2 oz (70.2 kg)  07/19/11 156 lb (70.761 kg)  01/07/11 155 lb (70.308  kg)  12/03/10 156 lb (70.761 kg)  04/13/10 155 lb (70.308 kg)  11/27/09 158 lb 12.8 oz (72.031 kg)   % Usual Wt: 93% of weight 2 months ago  Body mass index is 19.52 kg/(m^2).  Labs:  CMP     Component Value Date/Time   NA 136 12/14/2011 0443   K 4.0 12/14/2011 0443   CL 102 12/14/2011 0443   CO2 24 12/14/2011 0443   GLUCOSE 145* 12/14/2011 0443   BUN 26* 12/14/2011 0443   CREATININE 1.52* 12/14/2011 0443   CALCIUM 8.5 12/14/2011 0443   PROT 6.3 12/13/2011 1204   ALBUMIN 3.1* 12/13/2011 1204   AST 18 12/13/2011 1204   ALT 19 12/13/2011 1204   ALKPHOS 47 12/13/2011 1204   BILITOT 0.5 12/13/2011 1204   GFRNONAA 38* 12/14/2011 0443   GFRAA 44* 12/14/2011 0443    I/O last 3 completed shifts: In: 835 [I.V.:685; IV Piggyback:150] Out: 1100 [Urine:1100] Total I/O In: 360 [P.O.:360] Out: -    Diet Order: Cardiac  Supplements/Tube Feeding:  none  IVF:    sodium chloride Last Rate: 75 mL/hr at 12/13/11 1239    Estimated Nutritional Needs:   Kcal:  1900-2000 Protein: 80-90 gm Fluid: >1.9L  Food/Nutrition Related Hx: Regular diet at home with variable appetite and intake.  Drinks 1 boost daily.  Weight loss of 7% in the past 2 months related to difficulty breathing.  81% of IBW.  Pt with decreased muscle mass and body fat.  Pt meets criteria for Moderate malnutrition related to chronic illness. Current intake is very good.  100% of meals.  NUTRITION DIAGNOSIS: -Increased nutrient needs (NI-5.1).  Status: Ongoing  RELATED TO: decreased weight  AS EVIDENCE BY: 81% IBW  MONITORING/EVALUATION(Goals): Monitor:  Intake, labs, weight Goal:  Intake of >75% meals and supplements.  Prevention of weight loss.  EDUCATION NEEDS: -Education needs addressed-Increased needs discussed with pt  INTERVENTION: Boost bid Continue current diet.  Encouraged po.  Provide preferences.  Dietitian [phone]  DOCUMENTATION CODES Per approved criteria  -Non-severe (moderate) malnutrition in the  context of chronic illness    Derrell Lolling Anastasia Fiedler 12/14/2011, 12:11 PM

## 2011-12-14 NOTE — Progress Notes (Signed)
ANTIBIOTIC CONSULT NOTE - INITIAL  Pharmacy Consult for vancomycin/zosyn Indication: rule out pneumonia  Allergies  Allergen Reactions  . Sulfamethoxazole     REACTION: unspecified    Patient Measurements: Height: 6' (182.9 cm) Weight: 143 lb 14.4 oz (65.273 kg) IBW/kg (Calculated) : 77.6  Adjusted Body Weight:   Vital Signs: Temp: 97.6 F (36.4 C) (10/02 1340) Temp src: Oral (10/02 1340) BP: 119/49 mmHg (10/02 1340) Pulse Rate: 70  (10/02 1340) Intake/Output from previous day: 10/01 0701 - 10/02 0700 In: 835 [I.V.:685; IV Piggyback:150] Out: 1100 [Urine:1100] Intake/Output from this shift: Total I/O In: 600 [P.O.:600] Out: 250 [Urine:250]  Labs:  [facility] 12/14/11 0443 12/13/11 0420  WBC 9.0 9.6  HGB 10.9* 12.5*  PLT 181 190  LABCREA -- --  CREATININE 1.52* 1.34   Estimated Creatinine Clearance: 28 ml/min (by C-G formula based on Cr of 1.52). No results found for this basename: VANCOTROUGH:2,VANCOPEAK:2,VANCORANDOM:2,GENTTROUGH:2,GENTPEAK:2,GENTRANDOM:2,TOBRATROUGH:2,TOBRAPEAK:2,TOBRARND:2,AMIKACINPEAK:2,AMIKACINTROU:2,AMIKACIN:2, in the last 72 hours   Microbiology: Recent Results (from the past 720 hour(s))  URINE CULTURE     Status: Normal   Collection Time   12/06/11  5:59 AM      Component Value Range Status Comment   Specimen Description URINE, CLEAN CATCH   Final    Special Requests NONE   Final    Culture  Setup Time 12/07/2011 02:00   Final    Colony Count >=100,000 COLONIES/ML   Final    Culture ENTEROCOCCUS SPECIES   Final    Report Status 12/10/2011 FINAL   Final    Organism ID, Bacteria ENTEROCOCCUS SPECIES   Final   CULTURE, BLOOD (ROUTINE X 2)     Status: Normal (Preliminary result)   Collection Time   12/13/11  5:10 AM      Component Value Range Status Comment   Specimen Description BLOOD RIGHT ANTECUBITAL   Final    Special Requests BOTTLES DRAWN AEROBIC AND ANAEROBIC 10CC   Final    Culture  Setup Time 12/13/2011 09:03   Final    Culture     Final    Value:        BLOOD CULTURE RECEIVED NO GROWTH TO DATE CULTURE WILL BE HELD FOR 5 DAYS BEFORE ISSUING A FINAL NEGATIVE REPORT   Report Status PENDING   Incomplete   CULTURE, BLOOD (ROUTINE X 2)     Status: Normal (Preliminary result)   Collection Time   12/13/11  5:18 AM      Component Value Range Status Comment   Specimen Description BLOOD LEFT ANTECUBITAL   Final    Special Requests BOTTLES DRAWN AEROBIC AND ANAEROBIC [facility]   Final    Culture  Setup Time 12/13/2011 09:03   Final    Culture     Final    Value:        BLOOD CULTURE RECEIVED NO GROWTH TO DATE CULTURE WILL BE HELD FOR 5 DAYS BEFORE ISSUING A FINAL NEGATIVE REPORT   Report Status PENDING   Incomplete   URINE CULTURE     Status: Normal (Preliminary result)   Collection Time   12/13/11  5:34 AM      Component Value Range Status Comment   Specimen Description URINE, CATHETERIZED   Final    Special Requests NONE   Final    Culture  Setup Time 12/13/2011 09:15   Final    Colony Count PENDING   Incomplete    Culture Culture reincubated for better growth   Final    Report Status PENDING  Incomplete     Medical History: Past Medical History  Diagnosis Date  . Mild aortic stenosis     last echo 08/2006  . Coronary artery disease     nonobstructive  . Myocardial infarction   . Hyperlipidemia   . Hypertension   . Vitamin B12 deficiency   . Neuropathy   . Allergic rhinitis   . OA (osteoarthritis)   . History of bladder cancer   . GERD (gastroesophageal reflux disease)   . Esophageal stricture     Medications:  Scheduled:    . aspirin EC  81 mg Oral Daily  . diltiazem  180 mg Oral Daily  . Fluticasone-Salmeterol  1 puff Inhalation BID  . influenza  inactive virus vaccine  0.5 mL Intramuscular Tomorrow-1000  . ipratropium      . l-methylfolate-B6-B12  1 tablet Oral Daily  . lactose free nutrition  237 mL Oral TID WC  . multivitamin with minerals  1 tablet Oral Daily  . pantoprazole  40 mg Oral  Q1200  . piperacillin-tazobactam (ZOSYN)  IV  3.375 g Intravenous Q8H  . predniSONE  80 mg Oral BID WC  . senna  3 tablet Oral QODAY  . Tamsulosin HCl  0.4 mg Oral BID  . trandolapril  4 mg Oral Daily  . vancomycin  750 mg Intravenous Q24H  . DISCONTD: vancomycin  1,000 mg Intravenous Q24H   Assessment: 78 yo M admitted 12/13/11 with r/o HCAP vs COPD exacerbation. Reports have CAP in July 2013 and took abx Labs this am reveal SCr trending up, UOP decreased  Goal of Therapy:  Vancomycin trough level 15-20 mcg/ml  Plan:  -change vancomycin to 750mg  IV q24h, if SCr continues to rise will need to change frequency.    Dannielle Huh 12/14/2011,2:21 PM

## 2011-12-14 NOTE — Evaluation (Signed)
Physical Therapy Evaluation Patient Details Name: Thomas [name] MRN: [id number] DOB: 11-18-1916 Today's Date: 12/14/2011 Time: 4098-1191 PT Time Calculation (min): 21 min  PT Assessment / Plan / Recommendation Clinical Impression  Pt presents with SOB and history of MI, CAD, HTN and bladder cancer with indwelling catheter.  Tolerated OOB and ambulation in hallway well with RW.  Noted some imbalance, esp when negotiating around objects with RW, however pt states that he uses cane at home to walk.  Will take [facility] on next visit to determine safeness with it.  Ambulated on RA with O2 sats at 96% before ambulation and 95% following amb.  Pt will benefit from skilled PT in acute venue to address deficits.  PT recommends HHPT for follow up at D/C, however pt declines at this time.     PT Assessment  Patient needs continued PT services    Follow Up Recommendations  No PT follow up;Home health PT    Barriers to Discharge None      Equipment Recommendations  None recommended by PT    Recommendations for Other Services     Frequency Min 3X/week    Precautions / Restrictions Precautions Precautions: Fall Restrictions Weight Bearing Restrictions: No   Pertinent Vitals/Pain No pain      Mobility  Bed Mobility Bed Mobility: Rolling Left;Left Sidelying to Sit Rolling Left: 5: Supervision Left Sidelying to Sit: 5: Supervision;HOB flat Details for Bed Mobility Assistance: Supervision for safety, however pt performed log rolling well with only min cues for technique.  Transfers Transfers: Sit to Stand;Stand to Sit Sit to Stand: With upper extremity assist;From bed;4: Min assist Stand to Sit: 4: Min guard;With upper extremity assist;To bed Details for Transfer Assistance: Min assist to rise with gaurding for safety when sitting with cues for hand placement when sitting/standing and keeping RW with him until all the way at seating surface.  Ambulation/Gait Ambulation/Gait Assistance: 4: Min  assist Ambulation Distance (Feet): 200 Feet Assistive device: Rolling walker Ambulation/Gait Assistance Details: Cues for upright posture, safety and some assist when negotiating around objects.  States that he uses cane at home.  Will use at next session.  Gait Pattern: Step-through pattern;Trunk flexed Gait velocity: decreased Stairs: No Wheelchair Mobility Wheelchair Mobility: No    Shoulder Instructions     Exercises     PT Diagnosis: Difficulty walking;Generalized weakness  PT Problem List: Decreased strength;Decreased balance;Decreased mobility;Decreased knowledge of use of DME PT Treatment Interventions: DME instruction;Gait training;Stair training;Functional mobility training;Therapeutic activities;Therapeutic exercise;Balance training;Patient/family education   PT Goals Acute Rehab PT Goals PT Goal Formulation: With patient Time For Goal Achievement: 12/21/11 Potential to Achieve Goals: Good Pt will go Sit to Stand: with modified independence PT Goal: Sit to Stand - Progress: Goal set today Pt will go Stand to Sit: with modified independence PT Goal: Stand to Sit - Progress: Goal set today Pt will Ambulate: >150 feet;with supervision;with least restrictive assistive device PT Goal: Ambulate - Progress: Goal set today Pt will Go Up / Down Stairs: 3-5 stairs;with supervision;with least restrictive assistive device;with rail(s) PT Goal: Up/Down Stairs - Progress: Goal set today Pt will Perform Home Exercise Program: with supervision, verbal cues required/provided PT Goal: Perform Home Exercise Program - Progress: Goal set today  Visit Information  Last PT Received On: 12/14/11 Assistance Needed: +1    Subjective Data  Subjective: I'll get up and walk.  Patient Stated Goal: to get back home.    Prior Functioning  Home Living Lives With: Spouse Available Help  at Discharge: Family;Available 24 hours/day Type of Home: House Home Access: Stairs to enter [employer] of Steps: 3 Entrance Stairs-Rails: Right Home Layout: Multi-level;Able to live on main level with bedroom/bathroom Bathroom Shower/Tub: Health visitor: Handicapped height Home Adaptive Equipment: Grab bars in shower;Straight cane Additional Comments: Pt states that his son is with him when he using shower.  He also mentioned that he has trouble getting pants and socks on.  Educated pt on OT and adaptive equipment, however pt states he will do it on his own and is not interested in OT.  Prior Function Level of Independence: Independent with assistive device(s) Able to Take Stairs?: Yes Driving: No Vocation: Retired Musician: No difficulties    Cognition  Overall Cognitive Status: Appears within functional limits for tasks assessed/performed Arousal/Alertness: Awake/alert Orientation Level: Appears intact for tasks assessed Behavior During Session: [facility] for tasks performed    Extremity/Trunk Assessment Right Lower Extremity Assessment RLE ROM/Strength/Tone: WFL for tasks assessed RLE Sensation: History of peripheral neuropathy RLE Coordination: WFL - gross/fine motor Left Lower Extremity Assessment LLE ROM/Strength/Tone: WFL for tasks assessed LLE Sensation: History of peripheral neuropathy LLE Coordination: WFL - gross/fine motor Trunk Assessment Trunk Assessment: Kyphotic   Balance    End of Session PT - End of Session Equipment Utilized During Treatment: Gait belt Activity Tolerance: Patient tolerated treatment well Patient left: in bed;with call bell/phone within reach;with bed alarm set Nurse Communication: Mobility status  GP     Page, Meribeth Mattes 12/14/2011, 2:43 PM

## 2011-12-14 NOTE — Progress Notes (Addendum)
Patient ID: Thomas [name], male   DOB: [birth year], 78 y.o.   MRN: [id number]  [facility] PROGRESS NOTE  Thomas [name] NWG:[id number] DOB: Jul 26, 1916 DOA: 12/13/2011 PCP: Nelwyn Salisbury, MD  Brief narrative: Pt is 78 y.o. male with the past medical history significant for coronary artery disease, past myocardial infarction, hypertension, BPH, gastroesophageal reflux disease and history of bladder cancer; who presented to emergency department with progressively worsening shortness of breath associated with productive cough of yellow sputum and wheezing, subjective fevers and chills that initially started 2 days prior to admission. Upon arrival to emergency department patient was found to be hypoxic with oxygen saturations in the low 80's, with chest x-ray findings concerning for pneumonia. Patient was admitted 12/13/2011 for further evaluation and management.   Principal Problem:  *Shortness of breath - This was determined to be most likely secondary to pneumonia imposed on chronic emphysema - Patient is clinically improving and maintaining oxygen saturations above 95% on 2 L nasal cannula - Will attempt to wean off of nasal cannula and monitor saturations on room air - We'll plan to narrow antibiotics in the morning - For now we'll continue supportive care with steroid, nebulizers as needed, Advair  Active Problems:  BLADDER CANCER - Patient has a Foley placed by primary urologist - Plan is to have Foley removed in 6 days   HYPERTENSION - Remains stable and at target goal - We'll continue to monitor vitals per floor protocol   CORONARY ARTERY DISEASE - Clinically stable - Continue aspirin   GERD - Continue Protonix   Emphysema - Patient will likely need followup with pulmonologist upon discharge for further evaluation of lung function - For now we'll continue supportive care and will monitor oxygen saturation per floor  protocol  Consultants:  None  Procedures/Studies: Dg Chest 2 View (if Patient Has Fever And/or Copd) 12/13/2011   IMPRESSION:  Mild lung base opacities; atelectasis, aspiration, or infiltrate.  Emphysematous changes.    Antibiotics:  Vancomycin 12/13/2011 -->  Zosyn 12/13/2011 -->  Code Status: DNR Family Communication: Pt at bedside Disposition Plan: Home when medically stable  HPI/Subjective: No events overnight.   Objective: Filed Vitals:   12/14/11 0849 12/14/11 0934 12/14/11 0946 12/14/11 1340  BP:  93/44 98/64 119/49  Pulse:  75  70  Temp:    97.6 F (36.4 C)  TempSrc:    Oral  Resp:  18  20  Height:      Weight:      SpO2: 92% 92%  96%    Intake/Output Summary (Last 24 hours) at 12/14/11 1416 Last data filed at 12/14/11 1345  Gross per 24 hour  Intake   1385 ml  Output   1150 ml  Net    235 ml    Exam:   General:  Pt is alert, follows commands appropriately, not in acute distress  Cardiovascular: Regular rate and rhythm, S1/S2, no murmurs, no rubs, no gallops  Respiratory: Clear to auscultation bilaterally with bibasilar crackles  Abdomen: Soft, non tender, non distended, bowel sounds present, no guarding  Extremities: No edema, pulses DP and PT palpable bilaterally  Neuro: Grossly nonfocal  Data Reviewed: Basic Metabolic Panel:  Lab 12/14/11 5784 12/13/11 0420  NA 136 135  K 4.0 3.7  CL 102 100  CO2 24 24  GLUCOSE 145* 137*  BUN 26* 18  CREATININE 1.52* 1.34  CALCIUM 8.5 8.8  MG -- --  PHOS -- --   Liver Function Tests:  Lab 12/13/11  1204  AST 18  ALT 19  ALKPHOS 47  BILITOT 0.5  PROT 6.3  ALBUMIN 3.1*   CBC:  Lab 12/14/11 0443 12/13/11 0420  WBC 9.0 9.6  NEUTROABS -- --  HGB 10.9* 12.5*  HCT 31.8* 37.5*  MCV 92.2 93.1  PLT 181 190   Cardiac Enzymes:  Lab 12/13/11 2338 12/13/11 1727 12/13/11 1204 12/13/11 0510  CKTOTAL -- -- -- --  CKMB -- -- -- --  CKMBINDEX -- -- -- --  TROPONINI <0.30 <0.30 <0.30 <0.30    Recent Results (from the past 240 hour(s))  URINE CULTURE     Status: Normal   Collection Time   12/06/11  5:59 AM      Component Value Range Status Comment   Specimen Description URINE, CLEAN CATCH   Final    Special Requests NONE   Final    Culture  Setup Time 12/07/2011 02:00   Final    Colony Count >=100,000 COLONIES/ML   Final    Culture ENTEROCOCCUS SPECIES   Final    Report Status 12/10/2011 FINAL   Final    Organism ID, Bacteria ENTEROCOCCUS SPECIES   Final   CULTURE, BLOOD (ROUTINE X 2)     Status: Normal (Preliminary result)   Collection Time   12/13/11  5:10 AM      Component Value Range Status Comment   Specimen Description BLOOD RIGHT ANTECUBITAL   Final    Special Requests BOTTLES DRAWN AEROBIC AND ANAEROBIC 10CC   Final    Culture  Setup Time 12/13/2011 09:03   Final    Culture     Final    Value:        BLOOD CULTURE RECEIVED NO GROWTH TO DATE CULTURE WILL BE HELD FOR 5 DAYS BEFORE ISSUING A FINAL NEGATIVE REPORT   Report Status PENDING   Incomplete   CULTURE, BLOOD (ROUTINE X 2)     Status: Normal (Preliminary result)   Collection Time   12/13/11  5:18 AM      Component Value Range Status Comment   Specimen Description BLOOD LEFT ANTECUBITAL   Final    Special Requests BOTTLES DRAWN AEROBIC AND ANAEROBIC [facility]   Final    Culture  Setup Time 12/13/2011 09:03   Final    Culture     Final    Value:        BLOOD CULTURE RECEIVED NO GROWTH TO DATE CULTURE WILL BE HELD FOR 5 DAYS BEFORE ISSUING A FINAL NEGATIVE REPORT   Report Status PENDING   Incomplete   URINE CULTURE     Status: Normal (Preliminary result)   Collection Time   12/13/11  5:34 AM      Component Value Range Status Comment   Specimen Description URINE, CATHETERIZED   Final    Special Requests NONE   Final    Culture  Setup Time 12/13/2011 09:15   Final    Colony Count PENDING   Incomplete    Culture Culture reincubated for better growth   Final    Report Status PENDING   Incomplete      Scheduled  Meds:   . aspirin EC  81 mg Oral Daily  . diltiazem  180 mg Oral Daily  . Fluticasone-Salmeterol  1 puff Inhalation BID  . l-methylfolate-B6-B12  1 tablet Oral Daily  . multivitamin  1 tablet Oral Daily  . pantoprazole  40 mg Oral Q1200  . piperacillin-tazobactam   3.375 g Intravenous Q8H  . predniSONE  80 mg Oral BID WC  . senna  3 tablet Oral QODAY  . Tamsulosin HCl  0.4 mg Oral BID  . trandolapril  4 mg Oral Daily  . vancomycin  1,000 mg Intravenous Q24H   Continuous Infusions:   . sodium chloride 75 mL/hr at 12/13/11 1239     Debbora Presto, MD  [facility] Pager [phone]  If 7PM-7AM, please contact night-coverage [email or web address] Password [email or web address] 12/14/2011, 2:16 PM   LOS: 1 day

## 2011-12-15 ENCOUNTER — Telehealth (HOSPITAL_COMMUNITY): Payer: Self-pay | Admitting: *Deleted

## 2011-12-15 LAB — CBC
MCH: 31.7 pg (ref 26.0–34.0)
Platelets: 188 10*3/uL (ref 150–400)
RBC: 3.5 MIL/uL — ABNORMAL LOW (ref 4.22–5.81)

## 2011-12-15 LAB — BASIC METABOLIC PANEL
CO2: 24 mEq/L (ref 19–32)
Calcium: 8.5 mg/dL (ref 8.4–10.5)
GFR calc non Af Amer: 38 mL/min — ABNORMAL LOW (ref >90)
Sodium: 136 mEq/L (ref 135–145)

## 2011-12-15 MED ORDER — LEVOFLOXACIN 750 MG PO TABS
750.0000 mg | ORAL_TABLET | Freq: Once | ORAL | Status: AC
  Administered 2011-12-15: 750 mg via ORAL
  Filled 2011-12-15: qty 1

## 2011-12-15 MED ORDER — PREDNISONE 50 MG PO TABS
50.0000 mg | ORAL_TABLET | Freq: Every day | ORAL | Status: DC
  Administered 2011-12-16: 50 mg via ORAL
  Filled 2011-12-15: qty 1

## 2011-12-15 MED ORDER — LEVOFLOXACIN 500 MG PO TABS: 500.0000 mg | ORAL_TABLET | ORAL | Status: DC

## 2011-12-15 NOTE — Clinical Documentation Improvement (Addendum)
MALNUTRITION DOCUMENTATION CLARIFICATION  THIS DOCUMENT IS NOT A PERMANENT PART OF THE MEDICAL RECORD  TO RESPOND TO THE THIS QUERY, FOLLOW THE INSTRUCTIONS BELOW:  1. If needed, update documentation for the patient's encounter via the notes activity.  2. Access this query again and click edit on the In [employer].  3. After updating, or not, click F2 to complete all highlighted (required) fields concerning your review. Select "additional documentation in the medical record" OR "no additional documentation provided".  4. Click Sign note button.  5. The deficiency will fall out of your In Basket *Please let us know if you are not able to complete this workflow by phone or e-mail (listed below).  Please update your documentation within the medical record to reflect your response to this query.                                                                                        12/15/11   Dear Dr. Izola Price, I / Associates,  In a better effort to capture your patient's severity of illness, reflect appropriate length of stay and utilization of resources, a review of the patient medical record has revealed the following indicators.    Based on your clinical judgment, please clarify and document in a progress note and/or discharge summary the clinical condition associated with the following supporting information:  In responding to this query please exercise your independent judgment.  The fact that a query is asked, does not imply that any particular answer is desired or expected.  In this pt admitted with PNA a review of the medical record reveals the following:   Nutrition Note states pt with moderate malnutrition on 12/14/11  BMI=19.52  HT=6'  WT=143lbs  Albumin-3.1  Weight loss of 7% in the past 2 months related to difficulty breathing per nutrition note 12/14/11  Please clarify if you agree pt with "moderate malnutrition" and document in the pn and d/c summary.   Possible  Clinical Conditions?   _______Moderate Malnutrition _______Severe Malnutrition   _______Protein Calorie Malnutrition _______Severe Protein Calorie Malnutrition _______Other Condition________________ _______Cannot clinically determine     Supporting Information: Risk Factors:  Signs & Symptoms:   Diagnostics:  Component     Latest Ref Rng 12/13/2011        12:04 PM  Albumin     3.5 - 5.2 g/dL 3.1 (L)      Treatment  Monitoring Cardiac diet  You may use possible, probable, or suspect with inpatient documentation. possible, probable, suspected diagnoses MUST be documented at the time of discharge  Reviewed: additional documentation provided in PN 12/15/2011  Thank You,  Enis Slipper  RN, BSN, MSN/Inf, CCDS Clinical Documentation Specialist [facility] Dept Pager: [phone] / E-mail: [email or web address]  Health Information Management [place]

## 2011-12-15 NOTE — Progress Notes (Signed)
Pt declined to get OOB, requesting to "get some rest". Felecia Shelling  PTA [facility]  Acute  Rehab Pager     [phone]

## 2011-12-15 NOTE — Progress Notes (Addendum)
Patient ID: Thomas [name], male   DOB: 09/21/1916, 78 y.o.   MRN: [id number]  [facility] PROGRESS NOTE  Thomas [name] Thomas [name]:[id number] DOB: Oct 09, 1916 DOA: 12/13/2011 PCP: Nelwyn Salisbury, MD  Brief narrative:  Pt is 78 y.o. male with the past medical history significant for coronary artery disease, past myocardial infarction, hypertension, BPH, gastroesophageal reflux disease and history of bladder cancer; who presented to emergency department with progressively worsening shortness of breath associated with productive cough of yellow sputum and wheezing, subjective fevers and chills that initially started 2 days prior to admission. Upon arrival to emergency department patient was found to be hypoxic with oxygen saturations in the low 80's, with chest x-ray findings concerning for pneumonia. Patient was admitted 12/13/2011 for further evaluation and management.   Principal Problem:  *Shortness of breath  - This was determined to be most likely secondary to pneumonia imposed on chronic emphysema  - Patient is clinically improving and maintaining oxygen saturations above 95% on 2 L nasal cannula  - We'll plan to narrow antibiotics today - For now we'll continue supportive care with steroid, nebulizers as needed, Advair   Active Problems:  BLADDER CANCER  - Patient has a Foley placed by primary urologist  - Plan is to have Foley removed in 5 days  HYPERTENSION  - Remains stable and at target goal  - We'll continue to monitor vitals per floor protocol  CORONARY ARTERY DISEASE  - Clinically stable  - Continue aspirin  GERD  - Continue Protonix  Emphysema  - Patient will likely need followup with pulmonologist upon discharge for further evaluation of lung function  - For now we'll continue supportive care and will monitor oxygen saturation per floor protocol  Moderate protein calorie malnutrition - encouraged PO intake  Consultants:  None  Procedures/Studies:  Dg Chest 2 View (if  Patient Has Fever And/or Copd)  12/13/2011  IMPRESSION:  Mild lung base opacities; atelectasis, aspiration, or infiltrate. Emphysematous changes.   Antibiotics:  Vancomycin 12/13/2011 -->  Zosyn 12/13/2011 -->  Code Status: DNR  Family Communication: Pt at bedside  Disposition Plan: Home when medically stable   HPI/Subjective: No events overnight.   Objective: Filed Vitals:   12/14/11 2139 12/15/11 0527 12/15/11 0730 12/15/11 1044  BP:  127/52 121/51   Pulse:  70 70   Temp:  97.3 F (36.3 C)    TempSrc:  Oral    Resp:  20 20   Height:      Weight:      SpO2: 93% 95% 96% 93%    Intake/Output Summary (Last 24 hours) at 12/15/11 1155 Last data filed at 12/15/11 0900  Gross per 24 hour  Intake   1710 ml  Output   1475 ml  Net    235 ml    Exam:   General:  Pt is alert, follows commands appropriately, not in acute distress  Cardiovascular: Regular rate and rhythm, S1/S2, no murmurs, no rubs, no gallops  Respiratory: Clear to auscultation bilaterally with minimal rhonchi  Abdomen: Soft, non tender, non distended, bowel sounds present, no guarding  Extremities: No edema, pulses DP and PT palpable bilaterally  Neuro: Grossly nonfocal  Data Reviewed: Basic Metabolic Panel:  Lab 12/15/11 2440 12/14/11 0443 12/13/11 0420  NA 136 136 135  K 3.9 4.0 3.7  CL 102 102 100  CO2 24 24 24   GLUCOSE 135* 145* 137*  BUN 32* 26* 18  CREATININE 1.50* 1.52* 1.34  CALCIUM 8.5 8.5 8.8  MG -- -- --  PHOS -- -- --   Liver Function Tests:  Lab 12/13/11 1204  AST 18  ALT 19  ALKPHOS 47  BILITOT 0.5  PROT 6.3  ALBUMIN 3.1*   No results found for this basename: LIPASE:5,AMYLASE:5 in the last 168 hours No results found for this basename: AMMONIA:5 in the last 168 hours CBC:  Lab 12/15/11 0444 12/14/11 0443 12/13/11 0420  WBC 9.5 9.0 9.6  NEUTROABS -- -- --  HGB 11.1* 10.9* 12.5*  HCT 32.4* 31.8* 37.5*  MCV 92.6 92.2 93.1  PLT 188 181 190   Cardiac  Enzymes:  Lab 12/13/11 2338 12/13/11 1727 12/13/11 1204 12/13/11 0510  CKTOTAL -- -- -- --  CKMB -- -- -- --  CKMBINDEX -- -- -- --  TROPONINI <0.30 <0.30 <0.30 <0.30   BNP: No components found with this basename: POCBNP:5 CBG: No results found for this basename: GLUCAP:5 in the last 168 hours  Recent Results (from the past 240 hour(s))  URINE CULTURE     Status: Normal   Collection Time   12/06/11  5:59 AM      Component Value Range Status Comment   Specimen Description URINE, CLEAN CATCH   Final    Special Requests NONE   Final    Culture  Setup Time 12/07/2011 02:00   Final    Colony Count >=100,000 COLONIES/ML   Final    Culture ENTEROCOCCUS SPECIES   Final    Report Status 12/10/2011 FINAL   Final    Organism ID, Bacteria ENTEROCOCCUS SPECIES   Final   CULTURE, BLOOD (ROUTINE X 2)     Status: Normal (Preliminary result)   Collection Time   12/13/11  5:10 AM      Component Value Range Status Comment   Specimen Description BLOOD RIGHT ANTECUBITAL   Final    Special Requests BOTTLES DRAWN AEROBIC AND ANAEROBIC 10CC   Final    Culture  Setup Time 12/13/2011 09:03   Final    Culture     Final    Value:        BLOOD CULTURE RECEIVED NO GROWTH TO DATE CULTURE WILL BE HELD FOR 5 DAYS BEFORE ISSUING A FINAL NEGATIVE REPORT   Report Status PENDING   Incomplete   CULTURE, BLOOD (ROUTINE X 2)     Status: Normal (Preliminary result)   Collection Time   12/13/11  5:18 AM      Component Value Range Status Comment   Specimen Description BLOOD LEFT ANTECUBITAL   Final    Special Requests BOTTLES DRAWN AEROBIC AND ANAEROBIC [facility]   Final    Culture  Setup Time 12/13/2011 09:03   Final    Culture     Final    Value:        BLOOD CULTURE RECEIVED NO GROWTH TO DATE CULTURE WILL BE HELD FOR 5 DAYS BEFORE ISSUING A FINAL NEGATIVE REPORT   Report Status PENDING   Incomplete   URINE CULTURE     Status: Normal (Preliminary result)   Collection Time   12/13/11  5:34 AM      Component Value Range  Status Comment   Specimen Description URINE, CATHETERIZED   Final    Special Requests NONE   Final    Culture  Setup Time 12/13/2011 09:15   Final    Colony Count 75,000 COLONIES/ML   Final    Culture ENTEROCOCCUS SPECIES   Final    Report Status PENDING   Incomplete  Scheduled Meds:   . aspirin EC  81 mg Oral Daily  . diltiazem  180 mg Oral Daily  . Fluticasone-Salmeterol  1 puff Inhalation BID  . l-methylfolate-B6-B12  1 tablet Oral Daily  . lactose free nutrition  237 mL Oral TID WC  . multivitamin with minerals  1 tablet Oral Daily  . pantoprazole  40 mg Oral Q1200  . piperacillin-tazobactam (ZOSYN)  IV  3.375 g Intravenous Q8H  . predniSONE  80 mg Oral Daily  . senna  3 tablet Oral QODAY  . Tamsulosin HCl  0.4 mg Oral BID  . trandolapril  4 mg Oral Daily  . vancomycin  750 mg Intravenous Q24H  . DISCONTD: predniSONE  80 mg Oral BID WC  . DISCONTD: vancomycin  1,000 mg Intravenous Q24H   Continuous Infusions:    Debbora Presto, MD  [facility] Pager [phone]  If 7PM-7AM, please contact night-coverage [email or web address] Password [email or web address] 12/15/2011, 11:55 AM   LOS: 2 days

## 2011-12-15 NOTE — Progress Notes (Addendum)
ANTIBIOTIC CONSULT NOTE - FOLLOW-UP  Pharmacy Consult for levofloxacin Indication: AECOPD  Allergies  Allergen Reactions  . Sulfamethoxazole     REACTION: unspecified    Patient Measurements: Height: 6' (182.9 cm) Weight: 143 lb 14.4 oz (65.273 kg) IBW/kg (Calculated) : 77.6  Adjusted Body Weight:   Vital Signs: Temp: 97.3 F (36.3 C) (10/03 0527) Temp src: Oral (10/03 0527) BP: 121/51 mmHg (10/03 0730) Pulse Rate: 70  (10/03 0730) Intake/Output from previous day: 10/02 0701 - 10/03 0700 In: 1710 [P.O.:1560; IV Piggyback:150] Out: 1475 [Urine:1475] Intake/Output from this shift: Total I/O In: 360 [P.O.:360] Out: -   Labs:  Basename 12/15/11 0444 12/14/11 0443 12/13/11 0420  WBC 9.5 9.0 9.6  HGB 11.1* 10.9* 12.5*  PLT 188 181 190  LABCREA -- -- --  CREATININE 1.50* 1.52* 1.34   Estimated Creatinine Clearance: 28.4 ml/min (by C-G formula based on Cr of 1.5). No results found for this basename: VANCOTROUGH:2,VANCOPEAK:2,VANCORANDOM:2,GENTTROUGH:2,GENTPEAK:2,GENTRANDOM:2,TOBRATROUGH:2,TOBRAPEAK:2,TOBRARND:2,AMIKACINPEAK:2,AMIKACINTROU:2,AMIKACIN:2, in the last 72 hours   Microbiology: Recent Results (from the past 720 hour(s))  URINE CULTURE     Status: Normal   Collection Time   12/06/11  5:59 AM      Component Value Range Status Comment   Specimen Description URINE, CLEAN CATCH   Final    Special Requests NONE   Final    Culture  Setup Time 12/07/2011 02:00   Final    Colony Count >=100,000 COLONIES/ML   Final    Culture ENTEROCOCCUS SPECIES   Final    Report Status 12/10/2011 FINAL   Final    Organism ID, Bacteria ENTEROCOCCUS SPECIES   Final   CULTURE, BLOOD (ROUTINE X 2)     Status: Normal (Preliminary result)   Collection Time   12/13/11  5:10 AM      Component Value Range Status Comment   Specimen Description BLOOD RIGHT ANTECUBITAL   Final    Special Requests BOTTLES DRAWN AEROBIC AND ANAEROBIC 10CC   Final    Culture  Setup Time 12/13/2011 09:03    Final    Culture     Final    Value:        BLOOD CULTURE RECEIVED NO GROWTH TO DATE CULTURE WILL BE HELD FOR 5 DAYS BEFORE ISSUING A FINAL NEGATIVE REPORT   Report Status PENDING   Incomplete   CULTURE, BLOOD (ROUTINE X 2)     Status: Normal (Preliminary result)   Collection Time   12/13/11  5:18 AM      Component Value Range Status Comment   Specimen Description BLOOD LEFT ANTECUBITAL   Final    Special Requests BOTTLES DRAWN AEROBIC AND ANAEROBIC [facility]   Final    Culture  Setup Time 12/13/2011 09:03   Final    Culture     Final    Value:        BLOOD CULTURE RECEIVED NO GROWTH TO DATE CULTURE WILL BE HELD FOR 5 DAYS BEFORE ISSUING A FINAL NEGATIVE REPORT   Report Status PENDING   Incomplete   URINE CULTURE     Status: Normal (Preliminary result)   Collection Time   12/13/11  5:34 AM      Component Value Range Status Comment   Specimen Description URINE, CATHETERIZED   Final    Special Requests NONE   Final    Culture  Setup Time 12/13/2011 09:15   Final    Colony Count 75,000 COLONIES/ML   Final    Culture ENTEROCOCCUS SPECIES   Final  Report Status PENDING   Incomplete     Medical History: Past Medical History  Diagnosis Date  . Mild aortic stenosis     last echo 08/2006  . Coronary artery disease     nonobstructive  . Myocardial infarction   . Hyperlipidemia   . Hypertension   . Vitamin B12 deficiency   . Neuropathy   . Allergic rhinitis   . OA (osteoarthritis)   . History of bladder cancer   . GERD (gastroesophageal reflux disease)   . Esophageal stricture     Medications:  Scheduled:     . aspirin EC  81 mg Oral Daily  . diltiazem  180 mg Oral Daily  . Fluticasone-Salmeterol  1 puff Inhalation BID  . l-methylfolate-B6-B12  1 tablet Oral Daily  . lactose free nutrition  237 mL Oral TID WC  . multivitamin with minerals  1 tablet Oral Daily  . pantoprazole  40 mg Oral Q1200  . predniSONE  50 mg Oral Daily  . senna  3 tablet Oral QODAY  . Tamsulosin HCl  0.4  mg Oral BID  . trandolapril  4 mg Oral Daily  . DISCONTD: piperacillin-tazobactam (ZOSYN)  IV  3.375 g Intravenous Q8H  . DISCONTD: predniSONE  80 mg Oral BID WC  . DISCONTD: predniSONE  80 mg Oral Daily  . DISCONTD: vancomycin  750 mg Intravenous Q24H  . DISCONTD: vancomycin  1,000 mg Intravenous Q24H   Assessment: 78 yo M admitted 12/13/11 with r/o HCAP vs COPD exacerbation. Reports have CAP in July 2013, initially started on vancomycin and zosyn, Today is D#3 antibiotics.  Orders to change to levofloxacin alone.  Urine cx growing 75K enterococcus (sens pending) - has foley for bladder cancer.  Goal of Therapy:  Vancomycin trough level 15-20 mcg/ml  Plan:  - per notes patient clinically improving, thus will use PO levofloxacin 750mg  PO x 1 then 500mg  PO q48h -Follow-up urine cx results  Dannielle Huh 12/15/2011,12:10 PM

## 2011-12-15 NOTE — Progress Notes (Signed)
Pt c/o of "chest hurts", rated 3/10, points to upper chest, describing it as "it just hurts".  Pain lasted less than 2 minutes, completely resolved without intervention.  Remained SR on telemetry.  HR 70, RR 20, O2 Sats 96% on room air, BP 121/51.  EKG shows NSR, inferior infarct age undetermined.  While assessing vital signs, pt starts to cry, states "I'm gonna die".  When asked why he says this, he states "they told me my heart was going to stop and asked me if I wanted to be brought back to life".  Explained to patient that ED personnel had only wanted to know his wishes, as we do with all patients regardless of diagnosis.  Pt seemed more calm after this explanation and verbalizes understanding.  Again, pt expresses complete relief of chest discomfort.  Dr Izola Price paged.  Report given to oncoming nurse.

## 2011-12-15 NOTE — ED Notes (Signed)
Patient Admitted

## 2011-12-16 LAB — CBC
Platelets: 219 10*3/uL (ref 150–400)
RBC: 3.68 MIL/uL — ABNORMAL LOW (ref 4.22–5.81)
WBC: 9.1 10*3/uL (ref 4.0–10.5)

## 2011-12-16 LAB — URINE CULTURE

## 2011-12-16 LAB — BASIC METABOLIC PANEL
CO2: 25 mEq/L (ref 19–32)
Chloride: 104 mEq/L (ref 96–112)
Potassium: 3.9 mEq/L (ref 3.5–5.1)
Sodium: 138 mEq/L (ref 135–145)

## 2011-12-16 MED ORDER — LEVOFLOXACIN 500 MG PO TABS: 500.0000 mg | ORAL_TABLET | ORAL | Status: DC

## 2011-12-16 MED ORDER — FLUTICASONE-SALMETEROL 250-50 MCG/DOSE IN AEPB: 1.0000 | INHALATION_SPRAY | Freq: Two times a day (BID) | RESPIRATORY_TRACT | Status: DC

## 2011-12-16 MED ORDER — PREDNISONE 50 MG PO TABS: ORAL_TABLET | ORAL | Status: DC

## 2011-12-16 MED ORDER — BISACODYL 5 MG PO TBEC: 5.0000 mg | DELAYED_RELEASE_TABLET | Freq: Every day | ORAL | Status: DC | PRN

## 2011-12-16 NOTE — Discharge Summary (Signed)
Physician Discharge Summary  Thomas [name] OZH:[id number] DOB: 1916-05-05 DOA: 12/13/2011  PCP: Nelwyn Salisbury, MD  Admit date: 12/13/2011 Discharge date: 12/16/2011  Recommendations for Outpatient Follow-up:  1. Pt will need to follow up with PCP in 2-3 weeks post discharge 2. Please obtain BMP to evaluate electrolytes and kidney function 3. Please also check CBC to evaluate Hg and Hct levels 4. Pt discharged with [facility] PT and RN 5. Please note that Urine cultures and sensitivities indicated enterobacter in urine sensitive to Amoxicillin 6. However, pt had no urinary concerns other than chronic Foley so will refer to primary urologist to address if antibiotic needed 7. Will forward this summary to PCP and primary urologist  Discharge Diagnoses: Acute hypoxic respiratory failure secondary to HCAP Principal Problem:  *Shortness of breath Active Problems:  BLADDER CANCER  HYPERLIPIDEMIA  HYPERTENSION  CORONARY ARTERY DISEASE  GERD  BENIGN PROSTATIC HYPERTROPHY, WITH OBSTRUCTION  Cough  Emphysema  Insomnia    Discharge Condition: Stable  Diet recommendation: Heart healthy diet discussed in details   Brief narrative:  Pt is 78 y.o. male with the past medical history significant for coronary artery disease, past myocardial infarction, hypertension, BPH, gastroesophageal reflux disease and history of bladder cancer; who presented to emergency department with progressively worsening shortness of breath associated with productive cough of yellow sputum and wheezing, subjective fevers and chills that initially started 2 days prior to admission. Upon arrival to emergency department patient was found to be hypoxic with oxygen saturations in the low 80's, with chest x-ray findings concerning for pneumonia. Patient was admitted 12/13/2011 for further evaluation and management.   Principal Problem:  *Shortness of breath  - This was determined to be most likely secondary to pneumonia imposed on  chronic emphysema  - Patient is clinically improving and maintaining oxygen saturations above 95% on 2 L nasal cannula  - We will continue Levaquin upon discharge - pt will continue prednisone taper for 4 additional days Active Problems:  BLADDER CANCER  - Patient has a Foley placed by primary urologist  - Plan is to have Foley removed on Monday - please note that urine analysis and culture indicated enterobacter in urine sensitive to amoxicillin - pt had not urinary symptoms other having Foley in place, so no other antibiotic coverage provided - will defer this to primary urologist to decide if antibiotic coverage needed HYPERTENSION  - Remains stable and at target goal  CORONARY ARTERY DISEASE  - Clinically stable  - Continue aspirin  GERD  - Continue Protonix  Emphysema  - Patient will likely need followup with pulmonologist upon discharge for further evaluation of lung function  - For now we'll continue supportive care and will monitor oxygen saturation per floor protocol  Moderate protein calorie malnutrition  - encouraged PO intake   Consultants:  None  Procedures/Studies:  Dg Chest 2 View (if Patient Has Fever And/or Copd)  12/13/2011  IMPRESSION:  Mild lung base opacities; atelectasis, aspiration, or infiltrate. Emphysematous changes.   Antibiotics:  Vancomycin 12/13/2011 --> 10/04 Zosyn 12/13/2011 --> 10/04 Levaquin upon discharge  Code Status: DNR  Family Communication: Pt at bedside   Discharge Exam: Filed Vitals:   12/16/11 0527  BP: 136/58  Pulse: 76  Temp: 98 F (36.7 C)  Resp: 18   Filed Vitals:   12/15/11 1930 12/15/11 2058 12/16/11 0527 12/16/11 0731  BP:  162/76 136/58   Pulse:  75 76   Temp:  97.7 F (36.5 C) 98 F (36.7 C)  TempSrc:  Oral Oral   Resp:  18 18   Height:      Weight:      SpO2: 94% 94% 93% 94%    General: Pt is alert, follows commands appropriately, not in acute distress Cardiovascular: Regular rate and rhythm, S1/S2  +, no murmurs, no rubs, no gallops Respiratory: Clear to auscultation bilaterally, no wheezing, no crackles, no rhonchi Abdominal: Soft, non tender, non distended, bowel sounds +, no guarding Extremities: no edema, no cyanosis, pulses palpable bilaterally DP and PT Neuro: Grossly nonfocal  Discharge Instructions  Discharge Orders    Future Orders Please Complete By Expires   Diet - low sodium heart healthy      Increase activity slowly          Medication List     As of 12/16/2011  9:17 AM    TAKE these medications         ADVIL PM 200-25 MG Caps   Generic drug: Ibuprofen-Diphenhydramine HCl   Take 1 tablet by mouth at bedtime.      aspirin EC 81 MG tablet   Take 81 mg by mouth daily.      bisacodyl 5 MG EC tablet   Commonly known as: DULCOLAX   Take 1 tablet (5 mg total) by mouth daily as needed.      diltiazem 180 MG 24 hr capsule   Commonly known as: CARDIZEM CD   Take 1 capsule (180 mg total) by mouth daily.      fluticasone 50 MCG/ACT nasal spray   Commonly known as: FLONASE   Place 2 sprays into the nose daily.      Fluticasone-Salmeterol 250-50 MCG/DOSE Aepb   Commonly known as: ADVAIR   Inhale 1 puff into the lungs 2 (two) times daily.      l-methylfolate-B6-B12 3-35-2 MG Tabs   Commonly known as: METANX   Take 1 tablet by mouth daily.      levofloxacin 500 MG tablet   Commonly known as: LEVAQUIN   Take 1 tablet (500 mg total) by mouth every other day.      multivitamin with minerals Tabs   Take 1 tablet by mouth daily.      nitrofurantoin (macrocrystal-monohydrate) 100 MG capsule   Commonly known as: MACROBID   Take 1 capsule (100 mg total) by mouth 2 (two) times daily.      nitroGLYCERIN 0.4 MG SL tablet   Commonly known as: NITROSTAT   Place 1 tablet (0.4 mg total) under the tongue every 5 (five) minutes as needed.      predniSONE 50 MG tablet   Commonly known as: DELTASONE   Taper down by 10 mg daily until completed, call [phone] with  questions      senna 8.6 MG Tabs   Commonly known as: SENOKOT   Take 3 tablets by mouth every other day. For constipation.      Tamsulosin HCl 0.4 MG Caps   Commonly known as: FLOMAX   Take 0.4 mg by mouth 2 (two) times daily.      temazepam 15 MG capsule   Commonly known as: RESTORIL   Take 1 capsule (15 mg total) by mouth at bedtime as needed for sleep.      trandolapril 4 MG tablet   Commonly known as: MAVIK   Take 4 mg by mouth daily.           Follow-up Information    Follow up with FRY,STEPHEN A, MD. In 1 week.  Contact information:   [address] [place] [address] [phone]           The results of significant diagnostics from this hospitalization (including imaging, microbiology, ancillary and laboratory) are listed below for reference.     Microbiology: Recent Results (from the past 240 hour(s))  CULTURE, BLOOD (ROUTINE X 2)     Status: Normal (Preliminary result)   Collection Time   12/13/11  5:10 AM      Component Value Range Status Comment   Specimen Description BLOOD RIGHT ANTECUBITAL   Final    Special Requests BOTTLES DRAWN AEROBIC AND ANAEROBIC 10CC   Final    Culture  Setup Time 12/13/2011 09:03   Final    Culture     Final    Value:        BLOOD CULTURE RECEIVED NO GROWTH TO DATE CULTURE WILL BE HELD FOR 5 DAYS BEFORE ISSUING A FINAL NEGATIVE REPORT   Report Status PENDING   Incomplete   CULTURE, BLOOD (ROUTINE X 2)     Status: Normal (Preliminary result)   Collection Time   12/13/11  5:18 AM      Component Value Range Status Comment   Specimen Description BLOOD LEFT ANTECUBITAL   Final    Special Requests BOTTLES DRAWN AEROBIC AND ANAEROBIC [facility]   Final    Culture  Setup Time 12/13/2011 09:03   Final    Culture     Final    Value:        BLOOD CULTURE RECEIVED NO GROWTH TO DATE CULTURE WILL BE HELD FOR 5 DAYS BEFORE ISSUING A FINAL NEGATIVE REPORT   Report Status PENDING   Incomplete   URINE CULTURE     Status: Normal  (Preliminary result)   Collection Time   12/13/11  5:34 AM      Component Value Range Status Comment   Specimen Description URINE, CATHETERIZED   Final    Special Requests NONE   Final    Culture  Setup Time 12/13/2011 09:15   Final    Colony Count 75,000 COLONIES/ML   Final    Culture ENTEROCOCCUS SPECIES   Final    Report Status PENDING   Incomplete      Labs: Basic Metabolic Panel:  Lab 12/16/11 9629 12/15/11 0444 12/14/11 0443 12/13/11 0420  NA 138 136 136 135  K 3.9 3.9 4.0 3.7  CL 104 102 102 100  CO2 25 24 24 24   GLUCOSE 134* 135* 145* 137*  BUN 32* 32* 26* 18  CREATININE 1.36* 1.50* 1.52* 1.34  CALCIUM 8.7 8.5 8.5 8.8  MG -- -- -- --  PHOS -- -- -- --   Liver Function Tests:  Lab 12/13/11 1204  AST 18  ALT 19  ALKPHOS 47  BILITOT 0.5  PROT 6.3  ALBUMIN 3.1*   No results found for this basename: LIPASE:5,AMYLASE:5 in the last 168 hours No results found for this basename: AMMONIA:5 in the last 168 hours CBC:  Lab 12/16/11 0435 12/15/11 0444 12/14/11 0443 12/13/11 0420  WBC 9.1 9.5 9.0 9.6  NEUTROABS -- -- -- --  HGB 11.6* 11.1* 10.9* 12.5*  HCT 34.0* 32.4* 31.8* 37.5*  MCV 92.4 92.6 92.2 93.1  PLT 219 188 181 190   Cardiac Enzymes:  Lab 12/13/11 2338 12/13/11 1727 12/13/11 1204 12/13/11 0510  CKTOTAL -- -- -- --  CKMB -- -- -- --  CKMBINDEX -- -- -- --  TROPONINI <0.30 <0.30 <0.30 <0.30  SIGNED: Time coordinating discharge: Over 30 minutes  Debbora Presto, MD  [facility] 12/16/2011, 9:17 AM Pager [phone]  If 7PM-7AM, please contact night-coverage [email or web address] Password [email or web address]

## 2011-12-16 NOTE — Progress Notes (Signed)
Physical Therapy Treatment Patient Details Name: Thomas [name] MRN: [id number] DOB: [birth year] Today's Date: 12/16/2011 Time: 4696-2952 PT Time Calculation (min): 26 min  PT Assessment / Plan / Recommendation Comments on Treatment Session  pt progressing well; reports feeling very well today; declines sitting in chair...states he is cold and wants to get under the covers    Follow Up Recommendations  Home health PT    Barriers to Discharge        Equipment Recommendations  None recommended by PT    Recommendations for Other Services    Frequency Min 3X/week   Plan Discharge plan remains appropriate;Frequency remains appropriate    Precautions / Restrictions Precautions Precautions: Fall Restrictions Weight Bearing Restrictions: No   Pertinent Vitals/Pain     Mobility  Bed Mobility Bed Mobility: Supine to Sit Supine to Sit: 5: Supervision;HOB flat Details for Bed Mobility Assistance: supervision for safety Transfers Transfers: Sit to Stand;Stand to Sit Sit to Stand: 5: Supervision;From bed;With upper extremity assist Stand to Sit: 5: Supervision;To bed;With upper extremity assist Details for Transfer Assistance: cues for safety and hand placement Ambulation/Gait Ambulation/Gait Assistance: 4: Min guard Ambulation Distance (Feet): 400 Feet Assistive device: Rolling walker Ambulation/Gait Assistance Details: cues for posture and negotiation of obstacles/steering RW safely Gait Pattern: Step-through pattern;Trunk flexed    Exercises Total Joint Exercises Ankle Circles/Pumps: 20 reps;AROM;Both Quad Sets: AROM;10 reps;Both Heel Slides: AROM;Both;10 reps Hip ABduction/ADduction: AROM;Both;10 reps   PT Diagnosis:    PT Problem List:   PT Treatment Interventions:     PT Goals Acute Rehab PT Goals Time For Goal Achievement: 12/21/11 Potential to Achieve Goals: Good Pt will go Sit to Stand: with modified independence PT Goal: Sit to Stand - Progress: Progressing  toward goal Pt will go Stand to Sit: with modified independence PT Goal: Stand to Sit - Progress: Progressing toward goal Pt will Ambulate: >150 feet;with supervision;with least restrictive assistive device PT Goal: Ambulate - Progress: Progressing toward goal Pt will Perform Home Exercise Program: with supervision, verbal cues required/provided PT Goal: Perform Home Exercise Program - Progress: Met  Visit Information  Last PT Received On: 12/16/11 Assistance Needed: +1    Subjective Data  Subjective: Ok, come in! Patient Stated Goal: to get back home.    Cognition  Overall Cognitive Status: Appears within functional limits for tasks assessed/performed Arousal/Alertness: Awake/alert Orientation Level: Appears intact for tasks assessed Behavior During Session: [facility] for tasks performed    Balance     End of Session PT - End of Session Activity Tolerance: Patient tolerated treatment well Patient left: in bed;with call bell/phone within reach;with bed alarm set   GP     [facility] 12/16/2011, 11:25 AM

## 2011-12-19 ENCOUNTER — Telehealth: Payer: Self-pay | Admitting: Family Medicine

## 2011-12-19 LAB — CULTURE, BLOOD (ROUTINE X 2): Culture: NO GROWTH

## 2011-12-19 NOTE — Telephone Encounter (Signed)
Caller: Angela/[employer] RN; Patient Name: Thomas [name]; PCP: Gershon Crane [facility]); Best Callback Phone Number: [phone]. Call in regards to medications. Pt was discharged from the hospital to home care.  Caller and pt want to know if he is to resume Plavix, Detrol LA and MetNx.  Also pt was sent home with Advair Disc and didn't receive a script for it.  Confirmed in EPIC  Advair has 3 refiills @ [facility], caller informed.  No symptoms to triage.   PLEASE CALL ANGELA TO CONFIRM MEDICATIONS. ITS OK TO LEAVE A MESSAGE WITH INFO.  Thank You.

## 2011-12-20 ENCOUNTER — Telehealth: Payer: Self-pay | Admitting: Family Medicine

## 2011-12-20 MED ORDER — FLUTICASONE-SALMETEROL 250-50 MCG/DOSE IN AEPB: 1.0000 | INHALATION_SPRAY | Freq: Two times a day (BID) | RESPIRATORY_TRACT | Status: DC

## 2011-12-20 NOTE — Telephone Encounter (Signed)
Refill request for Advair and I did send e-scribe, also spoke with pt.

## 2011-12-23 ENCOUNTER — Encounter: Payer: Self-pay | Admitting: Family Medicine

## 2011-12-23 ENCOUNTER — Ambulatory Visit (INDEPENDENT_AMBULATORY_CARE_PROVIDER_SITE_OTHER): Payer: Medicare Other | Admitting: Family Medicine

## 2011-12-23 VITALS — BP 116/72 | HR 92 | Temp 97.9°F | Wt 143.0 lb

## 2011-12-23 DIAGNOSIS — N320 Bladder-neck obstruction: Secondary | ICD-10-CM

## 2011-12-23 DIAGNOSIS — N179 Acute kidney failure, unspecified: Secondary | ICD-10-CM

## 2011-12-23 DIAGNOSIS — I2510 Atherosclerotic heart disease of native coronary artery without angina pectoris: Secondary | ICD-10-CM

## 2011-12-23 DIAGNOSIS — J449 Chronic obstructive pulmonary disease, unspecified: Secondary | ICD-10-CM

## 2011-12-23 DIAGNOSIS — N189 Chronic kidney disease, unspecified: Secondary | ICD-10-CM

## 2011-12-23 DIAGNOSIS — I10 Essential (primary) hypertension: Secondary | ICD-10-CM

## 2011-12-23 LAB — CBC WITH DIFFERENTIAL/PLATELET
Basophils Absolute: 0 10*3/uL (ref 0.0–0.1)
Eosinophils Absolute: 0.1 10*3/uL (ref 0.0–0.7)
HCT: 42.9 % (ref 39.0–52.0)
Hemoglobin: 14.1 g/dL (ref 13.0–17.0)
Lymphs Abs: 1.3 10*3/uL (ref 0.7–4.0)
MCHC: 32.9 g/dL (ref 30.0–36.0)
Monocytes Relative: 6.1 % (ref 3.0–12.0)
Neutro Abs: 9.6 10*3/uL — ABNORMAL HIGH (ref 1.4–7.7)
RDW: 13.9 % (ref 11.5–14.6)

## 2011-12-23 LAB — BASIC METABOLIC PANEL
CO2: 30 mEq/L (ref 19–32)
Glucose, Bld: 98 mg/dL (ref 70–99)
Potassium: 4.5 mEq/L (ref 3.5–5.1)
Sodium: 138 mEq/L (ref 135–145)

## 2011-12-23 MED ORDER — MOMETASONE FURO-FORMOTEROL FUM 100-5 MCG/ACT IN AERO: 2.0000 | INHALATION_SPRAY | Freq: Two times a day (BID) | RESPIRATORY_TRACT | Status: DC

## 2011-12-23 NOTE — Progress Notes (Signed)
  Subjective:    Patient ID: Thomas [name], male    DOB: [birth year], 78 y.o.   MRN: [id number]  HPI Here to follow up after a hospital stay from 12-13-11 to 12-16-11 for pneumonia. He has recovered well from this. His breathing is now back to baseline, which is having some SOB on exertion. No chest pain or fever or cough. His O2 sats are stable on room air. He asks about using Advair, which is difficult because it is a powder and and it does not seem to get down into his lungs well. He has been seeing Dr. McDiarmid for urinary retention, and he has stopped all antimuscarinic meds. He remains on Flomax. He has had the foley removed. He still complains about the constant sensation of urgency and feeling like he has to urinate.    Review of Systems  Constitutional: Positive for fatigue.  Respiratory: Positive for shortness of breath. Negative for choking and wheezing.   Cardiovascular: Negative.   Genitourinary: Positive for urgency, frequency and difficulty urinating. Negative for dysuria, hematuria and flank pain.       Objective:   Physical Exam  Constitutional:       Alert but weak, frail. Using a walker  Cardiovascular: Normal rate, regular rhythm, normal heart sounds and intact distal pulses.   Pulmonary/Chest: Effort normal and breath sounds normal.  Abdominal: Soft. Bowel sounds are normal. He exhibits no distension and no mass. There is no tenderness. There is no rebound and no guarding.          Assessment & Plan:  His COPD is at his baseline, and the pneumonia has resolved. We will switch from Advair to [facility] bid. Check labs today

## 2011-12-28 NOTE — Progress Notes (Signed)
Quick Note:  I spoke with pt's wife. ______ 

## 2012-01-02 ENCOUNTER — Observation Stay (HOSPITAL_COMMUNITY)
Admission: EM | Admit: 2012-01-02 | Discharge: 2012-01-03 | Disposition: A | Discharge status: Home / Self Care | Payer: Medicare Other | Attending: Internal Medicine | Admitting: Internal Medicine

## 2012-01-02 ENCOUNTER — Emergency Department (HOSPITAL_COMMUNITY): Payer: Medicare Other

## 2012-01-02 ENCOUNTER — Encounter (HOSPITAL_COMMUNITY): Payer: Self-pay

## 2012-01-02 DIAGNOSIS — E559 Vitamin D deficiency, unspecified: Secondary | ICD-10-CM | POA: Insufficient documentation

## 2012-01-02 DIAGNOSIS — N189 Chronic kidney disease, unspecified: Secondary | ICD-10-CM

## 2012-01-02 DIAGNOSIS — R05 Cough: Secondary | ICD-10-CM

## 2012-01-02 DIAGNOSIS — C679 Malignant neoplasm of bladder, unspecified: Secondary | ICD-10-CM

## 2012-01-02 DIAGNOSIS — Z9861 Coronary angioplasty status: Secondary | ICD-10-CM | POA: Insufficient documentation

## 2012-01-02 DIAGNOSIS — I6529 Occlusion and stenosis of unspecified carotid artery: Secondary | ICD-10-CM

## 2012-01-02 DIAGNOSIS — G4700 Insomnia, unspecified: Secondary | ICD-10-CM

## 2012-01-02 DIAGNOSIS — N179 Acute kidney failure, unspecified: Secondary | ICD-10-CM

## 2012-01-02 DIAGNOSIS — J309 Allergic rhinitis, unspecified: Secondary | ICD-10-CM

## 2012-01-02 DIAGNOSIS — R079 Chest pain, unspecified: Secondary | ICD-10-CM

## 2012-01-02 DIAGNOSIS — I350 Nonrheumatic aortic (valve) stenosis: Secondary | ICD-10-CM | POA: Diagnosis present

## 2012-01-02 DIAGNOSIS — K219 Gastro-esophageal reflux disease without esophagitis: Secondary | ICD-10-CM

## 2012-01-02 DIAGNOSIS — Z7982 Long term (current) use of aspirin: Secondary | ICD-10-CM | POA: Insufficient documentation

## 2012-01-02 DIAGNOSIS — J439 Emphysema, unspecified: Secondary | ICD-10-CM

## 2012-01-02 DIAGNOSIS — E538 Deficiency of other specified B group vitamins: Secondary | ICD-10-CM

## 2012-01-02 DIAGNOSIS — M1990 Unspecified osteoarthritis, unspecified site: Secondary | ICD-10-CM

## 2012-01-02 DIAGNOSIS — I2510 Atherosclerotic heart disease of native coronary artery without angina pectoris: Secondary | ICD-10-CM

## 2012-01-02 DIAGNOSIS — R059 Cough, unspecified: Secondary | ICD-10-CM

## 2012-01-02 DIAGNOSIS — R0602 Shortness of breath: Secondary | ICD-10-CM

## 2012-01-02 DIAGNOSIS — E785 Hyperlipidemia, unspecified: Secondary | ICD-10-CM

## 2012-01-02 DIAGNOSIS — H6120 Impacted cerumen, unspecified ear: Secondary | ICD-10-CM

## 2012-01-02 DIAGNOSIS — J209 Acute bronchitis, unspecified: Secondary | ICD-10-CM

## 2012-01-02 DIAGNOSIS — K222 Esophageal obstruction: Secondary | ICD-10-CM

## 2012-01-02 DIAGNOSIS — Z79899 Other long term (current) drug therapy: Secondary | ICD-10-CM | POA: Insufficient documentation

## 2012-01-02 DIAGNOSIS — K5900 Constipation, unspecified: Secondary | ICD-10-CM

## 2012-01-02 DIAGNOSIS — I359 Nonrheumatic aortic valve disorder, unspecified: Secondary | ICD-10-CM

## 2012-01-02 DIAGNOSIS — J4489 Other specified chronic obstructive pulmonary disease: Secondary | ICD-10-CM | POA: Insufficient documentation

## 2012-01-02 DIAGNOSIS — R1310 Dysphagia, unspecified: Secondary | ICD-10-CM

## 2012-01-02 DIAGNOSIS — Z96659 Presence of unspecified artificial knee joint: Secondary | ICD-10-CM | POA: Insufficient documentation

## 2012-01-02 DIAGNOSIS — Z8551 Personal history of malignant neoplasm of bladder: Secondary | ICD-10-CM | POA: Insufficient documentation

## 2012-01-02 DIAGNOSIS — J449 Chronic obstructive pulmonary disease, unspecified: Secondary | ICD-10-CM

## 2012-01-02 DIAGNOSIS — N401 Enlarged prostate with lower urinary tract symptoms: Secondary | ICD-10-CM

## 2012-01-02 DIAGNOSIS — J189 Pneumonia, unspecified organism: Secondary | ICD-10-CM

## 2012-01-02 DIAGNOSIS — N138 Other obstructive and reflux uropathy: Secondary | ICD-10-CM

## 2012-01-02 DIAGNOSIS — R42 Dizziness and giddiness: Secondary | ICD-10-CM

## 2012-01-02 DIAGNOSIS — N320 Bladder-neck obstruction: Secondary | ICD-10-CM

## 2012-01-02 DIAGNOSIS — R339 Retention of urine, unspecified: Secondary | ICD-10-CM | POA: Insufficient documentation

## 2012-01-02 DIAGNOSIS — I10 Essential (primary) hypertension: Secondary | ICD-10-CM

## 2012-01-02 DIAGNOSIS — G589 Mononeuropathy, unspecified: Secondary | ICD-10-CM

## 2012-01-02 DIAGNOSIS — I252 Old myocardial infarction: Secondary | ICD-10-CM

## 2012-01-02 HISTORY — DX: Chronic kidney disease, unspecified: N18.9

## 2012-01-02 LAB — CBC WITH DIFFERENTIAL/PLATELET
Eosinophils Absolute: 0.1 10*3/uL (ref 0.0–0.7)
Hemoglobin: 12 g/dL — ABNORMAL LOW (ref 13.0–17.0)
Lymphocytes Relative: 22 % (ref 12–46)
Lymphs Abs: 1.2 10*3/uL (ref 0.7–4.0)
MCH: 31.3 pg (ref 26.0–34.0)
Monocytes Relative: 12 % (ref 3–12)
Neutrophils Relative %: 63 % (ref 43–77)
Platelets: 143 10*3/uL — ABNORMAL LOW (ref 150–400)
RBC: 3.84 MIL/uL — ABNORMAL LOW (ref 4.22–5.81)
WBC: 5.3 10*3/uL (ref 4.0–10.5)

## 2012-01-02 LAB — BASIC METABOLIC PANEL
CO2: 25 mEq/L (ref 19–32)
GFR calc non Af Amer: 44 mL/min — ABNORMAL LOW (ref >90)
Glucose, Bld: 118 mg/dL — ABNORMAL HIGH (ref 70–99)
Potassium: 3.9 mEq/L (ref 3.5–5.1)
Sodium: 137 mEq/L (ref 135–145)

## 2012-01-02 LAB — POCT I-STAT TROPONIN I: Troponin i, poc: 0.01 ng/mL (ref 0.00–0.08)

## 2012-01-02 LAB — TROPONIN I
Troponin I: <0.3 ng/mL (ref <0.30)
Troponin I: <0.3 ng/mL (ref <0.30)

## 2012-01-02 LAB — CBC
HCT: 34.5 % — ABNORMAL LOW (ref 39.0–52.0)
MCHC: 34.2 g/dL (ref 30.0–36.0)
RDW: 13.5 % (ref 11.5–15.5)

## 2012-01-02 LAB — CREATININE, SERUM
Creatinine, Ser: 1.26 mg/dL (ref 0.50–1.35)
GFR calc non Af Amer: 47 mL/min — ABNORMAL LOW (ref >90)

## 2012-01-02 MED ORDER — ZOLPIDEM TARTRATE 5 MG PO TABS
5.0000 mg | ORAL_TABLET | Freq: Every evening | ORAL | Status: DC | PRN
  Administered 2012-01-02: 5 mg via ORAL
  Filled 2012-01-02: qty 1

## 2012-01-02 MED ORDER — DILTIAZEM HCL ER COATED BEADS 180 MG PO CP24
180.0000 mg | ORAL_CAPSULE | Freq: Every day | ORAL | Status: DC
  Administered 2012-01-02 – 2012-01-03 (×2): 180 mg via ORAL
  Filled 2012-01-02 (×3): qty 1

## 2012-01-02 MED ORDER — L-METHYLFOLATE-B6-B12 3-35-2 MG PO TABS
1.0000 | ORAL_TABLET | Freq: Every day | ORAL | Status: DC
  Administered 2012-01-02 – 2012-01-03 (×2): 1 via ORAL
  Filled 2012-01-02 (×2): qty 1

## 2012-01-02 MED ORDER — ONDANSETRON HCL 4 MG PO TABS: 4.0000 mg | ORAL_TABLET | Freq: Four times a day (QID) | ORAL | Status: DC | PRN

## 2012-01-02 MED ORDER — ASPIRIN EC 325 MG PO TBEC
325.0000 mg | DELAYED_RELEASE_TABLET | Freq: Every day | ORAL | Status: DC
  Administered 2012-01-03: 325 mg via ORAL
  Filled 2012-01-02: qty 1

## 2012-01-02 MED ORDER — ONDANSETRON HCL 4 MG/2ML IJ SOLN: 4.0000 mg | Freq: Four times a day (QID) | INTRAMUSCULAR | Status: DC | PRN

## 2012-01-02 MED ORDER — TEMAZEPAM 15 MG PO CAPS: 15.0000 mg | ORAL_CAPSULE | Freq: Every evening | ORAL | Status: DC | PRN

## 2012-01-02 MED ORDER — ACETAMINOPHEN 650 MG RE SUPP: 650.0000 mg | Freq: Four times a day (QID) | RECTAL | Status: DC | PRN

## 2012-01-02 MED ORDER — NITROGLYCERIN 0.4 MG SL SUBL: 0.4000 mg | SUBLINGUAL_TABLET | SUBLINGUAL | Status: DC | PRN

## 2012-01-02 MED ORDER — ONDANSETRON HCL 4 MG/2ML IJ SOLN: 4.0000 mg | Freq: Three times a day (TID) | INTRAMUSCULAR | Status: AC | PRN

## 2012-01-02 MED ORDER — MOMETASONE FURO-FORMOTEROL FUM 100-5 MCG/ACT IN AERO
2.0000 | INHALATION_SPRAY | Freq: Two times a day (BID) | RESPIRATORY_TRACT | Status: DC
  Administered 2012-01-02 – 2012-01-03 (×3): 2 via RESPIRATORY_TRACT
  Filled 2012-01-02: qty 8.8

## 2012-01-02 MED ORDER — SODIUM CHLORIDE 0.9 % IJ SOLN: 3.0000 mL | Freq: Two times a day (BID) | INTRAMUSCULAR | Status: DC

## 2012-01-02 MED ORDER — FLUTICASONE PROPIONATE 50 MCG/ACT NA SUSP
2.0000 | Freq: Every day | NASAL | Status: DC
  Administered 2012-01-02 – 2012-01-03 (×2): 2 via NASAL
  Filled 2012-01-02: qty 16

## 2012-01-02 MED ORDER — ACETAMINOPHEN 325 MG PO TABS: 650.0000 mg | ORAL_TABLET | Freq: Four times a day (QID) | ORAL | Status: DC | PRN

## 2012-01-02 MED ORDER — NITROFURANTOIN MONOHYD MACRO 100 MG PO CAPS: 100.0000 mg | ORAL_CAPSULE | Freq: Two times a day (BID) | ORAL | Status: DC

## 2012-01-02 MED ORDER — TRANDOLAPRIL 4 MG PO TABS
4.0000 mg | ORAL_TABLET | Freq: Every day | ORAL | Status: DC
  Administered 2012-01-02 – 2012-01-03 (×2): 4 mg via ORAL
  Filled 2012-01-02 (×2): qty 1

## 2012-01-02 MED ORDER — TAMSULOSIN HCL 0.4 MG PO CAPS
0.4000 mg | ORAL_CAPSULE | Freq: Two times a day (BID) | ORAL | Status: DC
  Administered 2012-01-02 – 2012-01-03 (×3): 0.4 mg via ORAL
  Filled 2012-01-02 (×5): qty 1

## 2012-01-02 MED ORDER — SODIUM CHLORIDE 0.9 % IJ SOLN
3.0000 mL | Freq: Two times a day (BID) | INTRAMUSCULAR | Status: DC
  Administered 2012-01-02: 3 mL via INTRAVENOUS

## 2012-01-02 MED ORDER — ENOXAPARIN SODIUM 40 MG/0.4ML ~~LOC~~ SOLN
40.0000 mg | SUBCUTANEOUS | Status: DC
  Administered 2012-01-02 – 2012-01-03 (×2): 40 mg via SUBCUTANEOUS
  Filled 2012-01-02 (×2): qty 0.4

## 2012-01-02 MED ORDER — SENNA 8.6 MG PO TABS
3.0000 | ORAL_TABLET | ORAL | Status: DC
  Filled 2012-01-02: qty 3

## 2012-01-02 MED ORDER — ADULT MULTIVITAMIN W/MINERALS CH
1.0000 | ORAL_TABLET | Freq: Every day | ORAL | Status: DC
  Administered 2012-01-02 – 2012-01-03 (×2): 1 via ORAL
  Filled 2012-01-02 (×2): qty 1

## 2012-01-02 NOTE — ED Provider Notes (Signed)
History     CSN: [id number]  Arrival date & time 01/02/12  4098   First MD Initiated Contact with Patient 01/02/12 0256      Chief Complaint  Patient presents with  . Chest Pain    (Consider location/radiation/quality/duration/timing/severity/associated sxs/prior treatment) HPI Comments: 78 year old male with a history of coronary artery disease, myocardial infarction, hypertension, GERD who presents with a complaint of chest pain which was initially left-sided, radiating to the mid chest into the right side, lasted approximately one hour, was described as burning and heavy and resolved completely with aspirin and nitroglycerin. The patient states that he is completely symptom-free at this time, he does have a history of heart attack in the 1990s and sees Dr. Daleen Squibb as a cardiologist. His family doctor is Dr. Shellia Carwin  The patient denies any shortness of breath, cough, fever, chills, nausea, vomiting, back pain, leg swelling, rashes, headache, blurred vision.  Patient is a 78 y.o. male presenting with chest pain. The history is provided by the patient, the EMS personnel and medical records.  Chest Pain     Past Medical History  Diagnosis Date  . Mild aortic stenosis     last echo 08/2006  . Coronary artery disease     nonobstructive  . Myocardial infarction   . Hyperlipidemia   . Hypertension   . Vitamin B12 deficiency   . Neuropathy   . Allergic rhinitis   . OA (osteoarthritis)   . History of bladder cancer   . GERD (gastroesophageal reflux disease)   . Esophageal stricture     Past Surgical History  Procedure Date  . Coronary angioplasty   . Appendectomy   . Cholecystectomy   . Tonsillectomy   . Sigmoidoscopy 1999  . Colonoscopy   . Bilateral elbow surgery   . Left knee replacement   . Carotid endarterectomy 08/2008  . Esophagogastroduodenoscopy     dilation    Family History  Problem Relation Age of Onset  . Coronary artery disease Brother   . Prostate  cancer      first degree relative    History  Substance Use Topics  . Smoking status: Former Smoker -- 1.0 packs/day for 10 years    Quit date: 03/14/1950  . Smokeless tobacco: Never Used  . Alcohol Use: No      Review of Systems  Cardiovascular: Positive for chest pain.  All other systems reviewed and are negative.    Allergies  Sulfamethoxazole  Home Medications   Current Outpatient Rx  Name Route Sig Dispense Refill  . ASPIRIN EC 81 MG PO TBEC Oral Take 81 mg by mouth daily.    Marland Kitchen DILTIAZEM HCL ER COATED BEADS 180 MG PO CP24 Oral Take 1 capsule (180 mg total) by mouth daily. 90 capsule 3  . FLUTICASONE PROPIONATE 50 MCG/ACT NA SUSP Nasal Place 2 sprays into the nose daily.    . L-METHYLFOLATE-B6-B12 3-35-2 MG PO TABS Oral Take 1 tablet by mouth daily.    . MOMETASONE FURO-FORMOTEROL FUM 100-5 MCG/ACT IN AERO Inhalation Inhale 2 puffs into the lungs 2 (two) times daily. 1 Inhaler 11  . ADULT MULTIVITAMIN W/MINERALS CH Oral Take 1 tablet by mouth daily.    Marland Kitchen NITROFURANTOIN MONOHYD MACRO 100 MG PO CAPS Oral Take 1 capsule (100 mg total) by mouth 2 (two) times daily. 20 capsule 0  . NITROGLYCERIN 0.4 MG SL SUBL Sublingual Place 1 tablet (0.4 mg total) under the tongue every 5 (five) minutes as needed. 75 tablet  3    Script called in to [facility] [phone] per pt reque ...  . SENNA 8.6 MG PO TABS Oral Take 3 tablets by mouth every other day. For constipation.    Marland Kitchen TAMSULOSIN HCL 0.4 MG PO CAPS Oral Take 0.4 mg by mouth 2 (two) times daily.     . TRANDOLAPRIL 4 MG PO TABS Oral Take 4 mg by mouth daily.    Marland Kitchen TEMAZEPAM 15 MG PO CAPS Oral Take 1 capsule (15 mg total) by mouth at bedtime as needed for sleep. 30 capsule 5    BP 150/58  Pulse 61  Temp 97.8 F (36.6 C) (Oral)  Resp 18  SpO2 96%  Physical Exam  Nursing note and vitals reviewed. Constitutional: He appears well-developed and well-nourished. No distress.  HENT:  Head: Normocephalic and atraumatic.  Mouth/Throat:  Oropharynx is clear and moist. No oropharyngeal exudate.  Eyes: Conjunctivae normal and EOM are normal. Pupils are equal, round, and reactive to light. Right eye exhibits no discharge. Left eye exhibits no discharge. No scleral icterus.  Neck: Normal range of motion. Neck supple. No JVD present. No thyromegaly present.  Cardiovascular: Normal rate, regular rhythm and intact distal pulses.  Exam reveals no gallop and no friction rub.   Murmur (soft systolic murmur) heard. Pulmonary/Chest: Effort normal and breath sounds normal. No respiratory distress. He has no wheezes. He has no rales.  Abdominal: Soft. Bowel sounds are normal. He exhibits no distension and no mass. There is no tenderness.  Musculoskeletal: Normal range of motion. He exhibits no edema and no tenderness.  Lymphadenopathy:    He has no cervical adenopathy.  Neurological: He is alert. Coordination normal.  Skin: Skin is warm and dry. No rash noted. No erythema.  Psychiatric: He has a normal mood and affect. His behavior is normal.    ED Course  Procedures (including critical care time)  Labs Reviewed  BASIC METABOLIC PANEL - Abnormal; Notable for the following:    Glucose, Bld 118 (*)     BUN 27 (*)     GFR calc non Af Amer 44 (*)     GFR calc Af Amer 51 (*)     All other components within normal limits  CBC WITH DIFFERENTIAL - Abnormal; Notable for the following:    RBC 3.84 (*)     Hemoglobin 12.0 (*)     HCT 35.9 (*)     Platelets 143 (*)     All other components within normal limits  POCT I-STAT TROPONIN I   Dg Chest Port 1 View  01/02/2012  *RADIOLOGY REPORT*  Clinical Data: Chest pain  PORTABLE CHEST - 1 VIEW  Comparison: 12/13/2011  Findings: Heart size upper normal.  Central vascular congestion. Emphysematous changes.  Mild left lung base opacity. Small left pleural effusion not excluded.  No pneumothorax.  Advanced degenerative changes the right shoulder.  No acute osseous finding.  IMPRESSION: Heart size  upper normal to mildly enlarged.  Mild left lung base opacity; atelectasis versus infiltrate.  Emphysema.   Original Report Authenticated By: Waneta Martins, M.D.      1. Chest pain       MDM  On my exam the patient is in no distress, he has an EKG which shows anterior Q waves and nonspecific T wave abnormalities, this is consistent with a prior EKG from earlier this month. He denies having any pain at this time, we'll pursue troponin and chest x-ray.  ED ECG REPORT  I  personally interpreted this EKG   Date: 01/02/2012   Rate: 70  Rhythm: normal sinus rhythm  QRS Axis: normal  Intervals: normal  ST/T Wave abnormalities: nonspecific T wave changes  Conduction Disutrbances:none  Narrative Interpretation:   Old EKG Reviewed: No significant changes compared with 12/15/2011   Pt is stable at this time - ECG non ischemic - has been CP free - d/w hospitalist for r/o of ACS given pt's age and RF for same.  Dr. Toniann Fail agrees to admit - tele bed ordered.  Vida Roller, MD 01/02/12 [phone]

## 2012-01-02 NOTE — Consult Note (Signed)
CARDIOLOGY CONSULT NOTE  Patient ID: Thomas [name], MRN: [id number], DOB/AGE: 1917/02/17 78 y.o. Admit date: 01/02/2012 Date of Consult: 01/02/2012  Primary Physician: Nelwyn Salisbury, MD Primary Cardiologist: Dr. Daleen Squibb  Chief Complaint: chest pain Reason for Consultation: chest pain  HPI: 78 y.o. male w/ PMHx significant for CAD (s/p PCI 1995), Aortic Stenosis (mild by echo '08), Carotid Artery Dz, HLD, HTN, COPD, GERD, and urinary retention (indwelling foley) who presented to [facility] on 01/02/2012 with complaints of chest pain.  Patient reports having a stent placed in 1995. Cath 2005 showed moderate 2 vessel CAD involving the distal Left main & LCx/OM, treated medically. Echo in 2008 showed EF 60%, mild AS (mean gradient , AV area 1cm2). Hospitalized 10/1-10/4 for pneumonia. Last seen in clinic by Dr. Daleen Squibb on 12/08/11 at which time he had no cardiac complaints and no changes were made. He did note feeling discouraged that he had not completely recovered from his pneumonia and felt like he wanted to give up.  While lying in bed around midnight he experienced sudden onset burning pain across his chest. No radiation or associated nausea, sob, or diaphoresis. He sat up and took two ASA and 1 NTG. The pain continued for ~4hrs prompting him to call EMS. The pain subsided in the ambulance without further intervention and he has remained pain free. He has never felt this pain before. Other than urinary issues he has no complaints and has been feeling well. Denies fever, chills, cough, sob, orthopnea, PND, palpitations, syncope, change in bowel habits, melena/hematochezia. He has a therapist that works with him 3x week and otherwise only walks in his house or to the mailbox. He is able to do this without sob or chest pain.   In the ED, EKG shows sinus rhythm 70bpm, no acute changes from prior EKG. CXR shows mild left lung base opacity; atelectasis versus infiltrate. Labs are significant  for normal troponin x2, WBC 5.1, Hgb 11.8, BUN/Crt 27/1.34, otherwise unremarkable CBC/BMET. He is currently resting in bed and pain free. No tachycardia, tachypnea, or hypoxia.   Past Medical History  Diagnosis Date  . Mild aortic stenosis     last echo 08/2006  . Coronary artery disease     s/p PCI in 1995; Cath 2005 showed moderate 2 vessel CAD involving the distal Left main & LCx/OM, treated medically  . Myocardial infarction   . Hyperlipidemia   . Hypertension   . Vitamin B12 deficiency   . Neuropathy   . Allergic rhinitis   . OA (osteoarthritis)   . History of bladder cancer   . GERD (gastroesophageal reflux disease)   . Esophageal stricture      2008 - Echo SUMMARY - Left ventricular ejection fraction was estimated to be 60 %. Left ventricular wall thickness was mildly increased. - Moderate nodular calcification of leaflets Findings were consistent with mild aortic valve stenosis. There was trivial aortic valvular regurgitation. The mean transaortic valve gradient was 17 mmHg. Estimated aortic valve area (by VTI) was 1 cm^2. Estimated aortic valve area (by Vmax) was 0.9 cm^2. - Nodular thickening of leafltets There was mild mitral valvular regurgitation. - The left atrium was mildly dilated.  2005 - Cardiac Cath HEMODYNAMICS:  1. Left ventricular pressure 156/10.  2. Aortic pressure 148/75.  3. There is a less than 10 mm gradient across the aortic valve on catheter pullback.  LEFT VENTRICULOGRAM: Wall motion is normal. Ejection fraction estimated at greater than or equal to 65%. There is  no significant mitral regurgitation.  CORONARY ARTERIOGRAPHY:  Left main has a distal 60% stenosis.  Left anterior descending artery has minor luminal irregularities in the mid vessel. The LAD is otherwise normal giving rise to three small diagonal branches.  Left circumflex has a 60-70% stenosis at its ostium. In the proximal to mid vessel, there is a tubular 60-70% stenosis. The circumflex  gives rise to normal to large size first obtuse marginal, normal size second obtuse  marginal branch. In the mid portion of the first obtuse marginal branch there is a 70% stenosis.  Right coronary artery has a 30% stenosis in the proximal vessel, 30% stenosis in the mid vessel. The distal right coronary artery gives rise to a normal size posterior descending artery and a normal size posterior lateral branch. The posterior descending artery has a 30% stenosis at its ostium and a 30% stenosis in the mid segment. The posterior lateral branch also has a 30% stenosis in the mid vessel.  IMPRESSION:  1. Normal left ventricular systolic function.  2. Moderate two-vessel coronary artery disease involving the distal left main and the left circumflex and obtuse marginal. This disease is of borderline severity.  RECOMMENDATIONS: Medical therapy. If the patient has recurrent symptoms of chest pain which are felt to be possibly cardiac in etiology, would recommend a stress nuclear scan to better localize ischemia   Surgical History:  Past Surgical History  Procedure Date  . Coronary angioplasty   . Appendectomy   . Cholecystectomy   . Tonsillectomy   . Sigmoidoscopy 1999  . Colonoscopy   . Bilateral elbow surgery   . Left knee replacement   . Carotid endarterectomy 08/2008  . Esophagogastroduodenoscopy     dilation     Home Meds: Medication Sig  aspirin EC 81 MG tablet Take 81 mg by mouth daily.  diltiazem (CARDIZEM CD) 180 MG 24 hr capsule Take 1 capsule (180 mg total) by mouth daily.  fluticasone (FLONASE) 50 MCG/ACT nasal spray Place 2 sprays into the nose daily.  l-methylfolate-B6-B12 (METANX) 3-35-2 MG TABS Take 1 tablet by mouth daily.  mometasone-formoterol (DULERA) 100-5 MCG/ACT AERO Inhale 2 puffs into the lungs 2 (two) times daily.  Multiple Vitamin (MULTIVITAMIN WITH MINERALS) TABS Take 1 tablet by mouth daily.  nitrofurantoin, macrocrystal-monohydrate, (MACROBID) 100 MG capsule Take 1  capsule (100 mg total) by mouth 2 (two) times daily.  nitroGLYCERIN (NITROSTAT) 0.4 MG SL tablet Place 1 tablet (0.4 mg total) under the tongue every 5 (five) minutes as needed.  senna (SENOKOT) 8.6 MG TABS Take 3 tablets by mouth every other day. For constipation.  Tamsulosin HCl (FLOMAX) 0.4 MG CAPS Take 0.4 mg by mouth 2 (two) times daily.   trandolapril (MAVIK) 4 MG tablet Take 4 mg by mouth daily.  temazepam (RESTORIL) 15 MG capsule Take 1 capsule (15 mg total) by mouth at bedtime as needed for sleep.    Inpatient Medications:   . aspirin EC  325 mg Oral Daily  . diltiazem  180 mg Oral Daily  . enoxaparin (LOVENOX) injection  40 mg Subcutaneous Q24H  . fluticasone  2 spray Each Nare Daily  . l-methylfolate-B6-B12  1 tablet Oral Daily  . mometasone-formoterol  2 puff Inhalation BID  . multivitamin with minerals  1 tablet Oral Daily  . senna  3 tablet Oral QODAY  . sodium chloride  3 mL Intravenous Q12H  . sodium chloride  3 mL Intravenous Q12H  . Tamsulosin HCl  0.4 mg Oral BID  .  trandolapril  4 mg Oral Daily  . DISCONTD: nitrofurantoin (macrocrystal-monohydrate)  100 mg Oral BID      Allergies:  Allergies  Allergen Reactions  . Sulfamethoxazole     REACTION: unspecified    History   Social History  . Marital Status: Married    Spouse Name: N/A    Number of Children: N/A  . Years of Education: N/A   Occupational History  . Not on file.   Social History Main Topics  . Smoking status: Former Smoker -- 1.0 packs/day for 10 years    Quit date: 03/14/1950  . Smokeless tobacco: Never Used  . Alcohol Use: No  . Drug Use: No  . Sexually Active: No   Other Topics Concern  . 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 Coronary artery disease Brother   . Prostate cancer      first degree relative     Review of Systems: General: negative for chills,  fever, night sweats or weight changes.  Cardiovascular: (+) chest pain; negative for shortness of breath, dyspnea on exertion, edema, orthopnea, palpitations, or paroxysmal nocturnal dyspnea Dermatological: negative for rash Respiratory: negative for cough or wheezing Urologic: (+) indwelling urinary catheter 2/2 urinary retention Abdominal: negative for nausea, vomiting, diarrhea, bright red blood per rectum, melena, or hematemesis Neurologic: negative for visual changes, syncope, or dizziness All other systems reviewed and are otherwise negative except as noted above.  Labs:  [facility] 01/02/12 0701  TROPONINI <0.30   Component Value Date   WBC 5.1 01/02/2012   HGB 11.8* 01/02/2012   HCT 34.5* 01/02/2012   MCV 93.5 01/02/2012   PLT 129* 01/02/2012    Lab 01/02/12 0701 01/02/12 0308  NA -- 137  K -- 3.9  CL -- 104  CO2 -- 25  BUN -- 27*  CREATININE 1.26 --  CALCIUM -- 8.7  GLUCOSE -- 118*    Radiology/Studies:   01/02/2012 - PORTABLE CHEST - 1 VIEW   Findings: Heart size upper normal.  Central vascular congestion. Emphysematous changes.  Mild left lung base opacity. Small left pleural effusion not excluded.  No pneumothorax.  Advanced degenerative changes the right shoulder.  No acute osseous finding.  IMPRESSION: Heart size upper normal to mildly enlarged.  Mild left lung base opacity; atelectasis versus infiltrate.  Emphysema.      EKG: 01/02/12 - sinus rhythm 70bpm, no acute changes from prior EKG  Physical Exam: Blood pressure 178/70, pulse 72, temperature 97.5 F (36.4 C), temperature source Oral, resp. rate 20, height 6' 0.05" (1.83 m), weight 143 lb 1.3 oz (64.9 kg), SpO2 96.00%. General: Well developed,elderly white male in no acute distress. Head: Normocephalic, atraumatic, sclera non-icteric, no xanthomas, nares are without discharge.  Neck: Supple. Negative for carotid bruits. No JVD. Lungs: Clear bilaterally to auscultation without wheezes, rales, or rhonchi.  Breathing is unlabored. Heart: RRR with S1 S2. 3/6 systolic murmur best heard at RUSB. No rubs or gallops appreciated. Abdomen: Soft, non-tender, non-distended with normoactive bowel sounds.  No rebound/guarding. No obvious abdominal masses. Msk:  Strength and tone appear normal for age. Extremities: No clubbing or cyanosis. Trace bilat ankle edema.  Distal pedal pulses are intact and equal bilaterally. No palpable cord or erythema to bilat LE. Neuro: Alert and oriented X 3. Moves all extremities spontaneously. Psych:  Responds to questions appropriately with  a flat affect.   Assessment and Plan:  78 y.o. male w/ PMHx significant for CAD (s/p PCI 1995), Aortic Stenosis (mild by echo '08), Carotid Artery Dz, HLD, HTN, COPD, GERD, and urinary retention (indwelling foley) who presented to [facility] on 01/02/2012 with complaints of chest pain  1. Chest pain 2. Mild Aortic Stenosis by echo 2008 3. Coronary Artery Disease 4. GERD 5. Hypertension  Patient presents with complaints of burning pain across his chest relieved ~4hrs after taking ASA and NTG. No associated symptoms. He is now pain free. Has a history of CAD treated medically as well as mild AS by echo 2008. No shortness of breath or syncope. No recent exertional symptoms. EKG is unchanged and troponin normal x2. CXR shows LLL opacity, infiltrate vs atelectasis. Afebrile without leukocytosis or cough. Do not suspect ACS at this point. Does have 3/6 systolic murmur. Will check echo and follow cardiac enzymes. Plan to manage conservatively given his age and comorbidities.     Signed, HOPE, JESSICA PA-C 01/02/2012, 9:56 AM   Patient seen, examined. Available data reviewed. Agree with findings, assessment, and plan as outlined by [facility], PA-C. The patient was independently interviewed and examined. His physical examination demonstrates a harsh grade 3/6 crescendo decrescendo murmur at the right upper sternal border consistent  with at least moderate aortic stenosis. The patient had a prolonged episode of several hours of chest pain with at least initially negative cardiac biomarkers and an EKG without ischemic changes. Considering his advanced age, I would favor treating him conservatively. We will repeat an echocardiogram to evaluate the severity of his aortic stenosis. If he remains chest pain-free and his enzymes remain negative, I would consider discharge home in the morning. He has an elderly wife and they may need some increased support and assistance in the home. I will defer this evaluation to the primary team. Will follow along while he is here.  Tonny Bollman, M.D. 01/02/2012 11:02 AM

## 2012-01-02 NOTE — H&P (Signed)
Thomas [name] is an 78 y.o. male.   Patient was seen and examined on January 02, 2012. PCP - Dr. Rana Snare. Cardiologist - Dr. Valera Castle. Chief Complaint: Chest pain. HPI: 78 year-old male with history of CAD status post stenting, mild aortic stenosis presents with complaints of chest pain. Patient started experiencing chest pain around 2 AM. The chest pain was across the chest. Denies any radiation to the back or any associated shortness of breath. Denies any fever chills productive cough. Patient's chest pain got better after patient took some nitroglycerin sublingual and aspirin. The whole episode lasted for an hour. In the ER EKG shows nonspecific T-wave changes and cardiac enzyme has been negative. Chest x-ray shows infiltrates versus atelectasis the patient is afebrile and does not have any leukocytosis. Patient has been admitted for further observation.  Patient was recently admitted and discharged after being treated for pneumonia 2 weeks ago.  Patient has indwelling Foley catheter for urinary retention.  Past Medical History  Diagnosis Date  . Mild aortic stenosis     last echo 08/2006  . Coronary artery disease     nonobstructive  . Myocardial infarction   . Hyperlipidemia   . Hypertension   . Vitamin B12 deficiency   . Neuropathy   . Allergic rhinitis   . OA (osteoarthritis)   . History of bladder cancer   . GERD (gastroesophageal reflux disease)   . Esophageal stricture     Past Surgical History  Procedure Date  . Coronary angioplasty   . Appendectomy   . Cholecystectomy   . Tonsillectomy   . Sigmoidoscopy 1999  . Colonoscopy   . Bilateral elbow surgery   . Left knee replacement   . Carotid endarterectomy 08/2008  . Esophagogastroduodenoscopy     dilation    Family History  Problem Relation Age of Onset  . Coronary artery disease Brother   . Prostate cancer      first degree relative   Social History:  reports that he quit smoking about 61 years ago.  He has never used smokeless tobacco. He reports that he does not drink alcohol or use illicit drugs.  Allergies:  Allergies  Allergen Reactions  . Sulfamethoxazole     REACTION: unspecified     (Not in a hospital admission)  Results for orders placed during the hospital encounter of 01/02/12 (from the past 48 hour(s))  BASIC METABOLIC PANEL     Status: Abnormal   Collection Time   01/02/12  3:08 AM      Component Value Range Comment   Sodium 137  135 - 145 mEq/L    Potassium 3.9  3.5 - 5.1 mEq/L    Chloride 104  96 - 112 mEq/L    CO2 25  19 - 32 mEq/L    Glucose, Bld 118 (*) 70 - 99 mg/dL    BUN 27 (*) 6 - 23 mg/dL    Creatinine, Ser 1.61  0.50 - 1.35 mg/dL    Calcium 8.7  8.4 - 09.6 mg/dL    GFR calc non Af Amer 44 (*) >90 mL/min    GFR calc Af Amer 51 (*) >90 mL/min   CBC WITH DIFFERENTIAL     Status: Abnormal   Collection Time   01/02/12  3:08 AM      Component Value Range Comment   WBC 5.3  4.0 - 10.5 K/uL    RBC 3.84 (*) 4.22 - 5.81 MIL/uL    Hemoglobin 12.0 (*) 13.0 -  17.0 g/dL    HCT 16.1 (*) 09.6 - 52.0 %    MCV 93.5  78.0 - 100.0 fL    MCH 31.3  26.0 - 34.0 pg    MCHC 33.4  30.0 - 36.0 g/dL    RDW 04.5  40.9 - 81.1 %    Platelets 143 (*) 150 - 400 K/uL    Neutrophils Relative 63  43 - 77 %    Neutro Abs 3.4  1.7 - 7.7 K/uL    Lymphocytes Relative 22  12 - 46 %    Lymphs Abs 1.2  0.7 - 4.0 K/uL    Monocytes Relative 12  3 - 12 %    Monocytes Absolute 0.6  0.1 - 1.0 K/uL    Eosinophils Relative 2  0 - 5 %    Eosinophils Absolute 0.1  0.0 - 0.7 K/uL    Basophils Relative 1  0 - 1 %    Basophils Absolute 0.1  0.0 - 0.1 K/uL   POCT I-STAT TROPONIN I     Status: Normal   Collection Time   01/02/12  3:27 AM      Component Value Range Comment   Troponin i, poc 0.01  0.00 - 0.08 ng/mL    Comment 3             Dg Chest Port 1 View  01/02/2012  *RADIOLOGY REPORT*  Clinical Data: Chest pain  PORTABLE CHEST - 1 VIEW  Comparison: 12/13/2011  Findings: Heart size  upper normal.  Central vascular congestion. Emphysematous changes.  Mild left lung base opacity. Small left pleural effusion not excluded.  No pneumothorax.  Advanced degenerative changes the right shoulder.  No acute osseous finding.  IMPRESSION: Heart size upper normal to mildly enlarged.  Mild left lung base opacity; atelectasis versus infiltrate.  Emphysema.   Original Report Authenticated By: Waneta Martins, M.D.     Review of Systems  Constitutional: Negative.   HENT: Negative.   Eyes: Negative.   Respiratory: Negative.   Cardiovascular: Positive for chest pain.  Gastrointestinal: Negative.   Genitourinary: Negative.   Musculoskeletal: Negative.   Skin: Negative.   Neurological: Negative.   Endo/Heme/Allergies: Negative.   Psychiatric/Behavioral: Negative.     Blood pressure 139/66, pulse 64, temperature 97.8 F (36.6 C), temperature source Oral, resp. rate 18, SpO2 95.00%. Physical Exam  Constitutional: He is oriented to person, place, and time. He appears well-developed and well-nourished. No distress.  HENT:  Head: Normocephalic and atraumatic.  Right Ear: External ear normal.  Left Ear: External ear normal.  Nose: Nose normal.  Mouth/Throat: Oropharynx is clear and moist. No oropharyngeal exudate.  Eyes: Conjunctivae normal are normal. Pupils are equal, round, and reactive to light. Right eye exhibits no discharge. Left eye exhibits no discharge. No scleral icterus.  Neck: Normal range of motion. Neck supple.  Cardiovascular: Normal rate and regular rhythm.   Murmur heard. Respiratory: Effort normal and breath sounds normal. No respiratory distress. He has no wheezes. He has no rales.  GI: Soft. Bowel sounds are normal. He exhibits no distension. There is no tenderness. There is no rebound.  Musculoskeletal: Normal range of motion. He exhibits no edema and no tenderness.  Neurological: He is alert and oriented to person, place, and time.       Moves all extremities.    Skin: Skin is warm and dry. He is not diaphoretic.     Assessment/Plan #1. Chest pain in a patient with known history of  CAD status post stenting and mild aortic stenosis - presently chest pain-free. Cycle cardiac markers. Aspirin. Patient does have a loud systolic murmur. I have consulted cardiology for further recommendations. #2. Hypertension - continue present medications. #3. COPD - continue inhalers. Patient is not wheezing. #4. Indwelling Foley catheter secondary to urinary retention - patient is to follow with urologist next week and is on chronic suppressive antibiotic therapy which will be continued.  Patient's chest x-ray does show questionable atelectasis versus infiltrates. Patient is afebrile and does not have any leukocytosis or any productive cough. Patient was recently discharged home on antibiotics for pneumonia and at this time has no signs of active infection.  CODE STATUS - full code.  Eduard Clos 01/02/2012, 6:42 AM

## 2012-01-02 NOTE — ED Notes (Signed)
EMS-pt woke up approx 1hr ago with left sided chest pain radiating to right side of chest, pt took 650 of ASA and took 1 nitro at home. Pt reports pain went from a 9/10 to a 0/10 after the nitro. Denies any associated symptoms with the chest pain. Pt with noted indwelling foley for enlarged prostate. 20g(L)AC. NSR.

## 2012-01-02 NOTE — Progress Notes (Signed)
[facility]                                             Progress note I have seen and examined pt, who is a 78 year-old male with history of CAD status post stenting, mild aortic stenosis admitted this am by Dr Toniann Fail with complaints of chest pain. Cardiac enzymes so far neg, await echo. Appreciate cards input. Will continue current management plan as per Dr Toniann Fail, follow studies and further manage accordingly.  Donnalee Curry MD  [facility] [phone]

## 2012-01-02 NOTE — ED Notes (Signed)
MD at bedside. Dr. Toniann Fail

## 2012-01-02 NOTE — ED Notes (Signed)
Labs and xray results reviewed, pt family updated, pt alert, NAD, calm, interactive, skin W&D, resps e/u, speaking in clear complete sentences, (denies: pain, sob, nausea, dizziness, HA or other sx), family x3 at [facility], pt states, "I am just tired".

## 2012-01-03 DIAGNOSIS — I359 Nonrheumatic aortic valve disorder, unspecified: Secondary | ICD-10-CM

## 2012-01-03 NOTE — Discharge Summary (Addendum)
Physician Discharge Summary  Thomas [name]:[id number] DOB: [birth year] DOA: 01/02/2012  PCP: Nelwyn Salisbury, MD  Admit date: 01/02/2012 Discharge date: 01/03/2012  Time spent: <30 minutes  Recommendations for Outpatient Follow-up:  1.      Follow-up Information    Follow up with Nelwyn Salisbury, MD. (in 1-2week, call for appt upon discharge)    Contact information:   [address] Thomas [name] [facility] [place] [address] [phone]       Follow up with Thomas Aquas., MD. (in 1-2weeks, call for appt upon discharge)    Contact information:   [address] [place] [address] [phone]        -Urologist - Next Week, as scheduled.  -Resume Home Health PT/RN  Discharge Diagnoses:  Principal Problem:  *Chest pain Active Problems:  CORONARY ARTERY DISEASE  AORTIC STENOSIS, moderate, -per10/22 echo  COPD (chronic obstructive pulmonary disease)   Discharge Condition: Improved/stable  Diet recommendation: Heart healthy  Filed Weights   01/02/12 0824  Weight: 64.9 kg (143 lb 1.3 oz)    History of present illness: Patient is 78 year-old male with history of CAD status post stenting, mild aortic stenosis presents with complaints of chest pain. Patient started experiencing chest pain around 2 AM. The chest pain was across the chest. Denies any radiation to the back or any associated shortness of breath. Denies any fever chills productive cough. Patient's chest pain got better after patient took some nitroglycerin sublingual and aspirin. The whole episode lasted for an hour. In the ER EKG shows nonspecific T-wave changes and cardiac enzyme has been negative. Chest x-ray shows infiltrates versus atelectasis the patient is afebrile and does not have any leukocytosis. Patient was admitted for further observation.   Hospital Course by problem list:  1. Chest pain in a patient with known history of CAD status post stenting and moderate aortic stenosis-per 10/22 echo    As discussed above, upon admission cardiac enzymes were cycled and came back negative. A 2-D echocardiogram was done and shows an ejection fraction of 50-55% with normal wall motion-no regional wall motion abnormalities, Aortic valve with moderate stenosis (noted that he was reported to have a prior history of mild aortic stenosis) cardiology was consulted and followed patient and recommended conservative management. Per Dr. Riley Kill this a.m. patient will need for close followup with Dr. Excell Seltzer. he has remained chest pain-free, will discharge for outpatient followup #2. Hypertension - his blood pressures remained controlled on his outpatient medications, he is continue present medications upon discharge..  #3. COPD - stable, continue inhalers. #4. Indwelling Foley catheter secondary to urinary retention - patient is to follow with urologist next week and is on chronic suppressive antibiotic therapy which will be continued.   Procedures: Study Conclusions  - Left ventricle: The cavity size was normal. Wall thickness was increased in a pattern of mild LVH. Systolic function was normal. The estimated ejection fraction was in the range of 50% to 55%. Wall motion was normal; there were no regional wall motion abnormalities. Doppler parameters are consistent with abnormal left ventricular relaxation (grade 1 diastolic dysfunction). - Aortic valve: There was moderate stenosis. Trivial regurgitation. Valve area: 0.92cm^2(VTI). Valve area: 0.93cm^2 (Vmax). - Mitral valve: Calcified annulus. Moderately thickened leaflets . - Atrial septum: No defect or patent foramen ovale was identified. - Pulmonary arteries: PA peak pressure: 36mm Hg (S). Transthoracic echocardiography.   Consultations:  Cardiology  Discharge Exam: Filed Vitals:   01/02/12 2100 01/02/12 2105 01/03/12 0500 01/03/12 4403  BP: 151/67  118/69   Pulse: 71  76   Temp: 98.1 F (36.7 C)  97.8 F (36.6 C)   TempSrc: Oral   Oral   Resp: 18  18   Height:      Weight:      SpO2: 95% 94% 94% 90%    Physical exam In general: Elderly male alert and oriented x3 in no apparent distress Cardiovascular: Normal rate and regular rhythm. 2/6 systolic murmur  Respiratory: He is to auscultation bilaterally, He has no wheezes. He has no rales.  Abdomen: Soft, bowel sounds present nontender nondistended no organomegaly and no masses palpable Extremities: No cyanosis and no edema Discharge Instructions  Discharge Orders    Future Orders Please Complete By Expires   Diet - low sodium heart healthy      Increase activity slowly          Medication List     As of 01/03/2012 12:09 PM    TAKE these medications         aspirin EC 81 MG tablet   Take 81 mg by mouth daily.      diltiazem 180 MG 24 hr capsule   Commonly known as: CARDIZEM CD   Take 1 capsule (180 mg total) by mouth daily.      fluticasone 50 MCG/ACT nasal spray   Commonly known as: FLONASE   Place 2 sprays into the nose daily.      l-methylfolate-B6-B12 3-35-2 MG Tabs   Commonly known as: METANX   Take 1 tablet by mouth daily.      mometasone-formoterol 100-5 MCG/ACT Aero   Commonly known as: DULERA   Inhale 2 puffs into the lungs 2 (two) times daily.      multivitamin with minerals Tabs   Take 1 tablet by mouth daily.      nitrofurantoin (macrocrystal-monohydrate) 100 MG capsule   Commonly known as: MACROBID   Take 1 capsule (100 mg total) by mouth 2 (two) times daily.      nitroGLYCERIN 0.4 MG SL tablet   Commonly known as: NITROSTAT   Place 1 tablet (0.4 mg total) under the tongue every 5 (five) minutes as needed.      senna 8.6 MG Tabs   Commonly known as: SENOKOT   Take 3 tablets by mouth every other day. For constipation.      Tamsulosin HCl 0.4 MG Caps   Commonly known as: FLOMAX   Take 0.4 mg by mouth 2 (two) times daily.      temazepam 15 MG capsule   Commonly known as: RESTORIL   Take 1 capsule (15 mg total) by mouth  at bedtime as needed for sleep.      trandolapril 4 MG tablet   Commonly known as: MAVIK   Take 4 mg by mouth daily.           Follow-up Information    Follow up with Nelwyn Salisbury, MD. (in 1-2week, call for appt upon discharge)    Contact information:   [address] [place] [facility] [place] [address] [phone]       Follow up with Thomas Aquas., MD. (in 1-2weeks, call for appt upon discharge)    Contact information:   [address] [place] [address] [phone]           The results of significant diagnostics from this hospitalization (including imaging, microbiology, ancillary and laboratory) are listed below for reference.    Significant Diagnostic Studies: Dg  Chest 2 View (if Patient Has Fever And/or Copd)  12/13/2011  *RADIOLOGY REPORT*  Clinical Data: Shortness of breath  CHEST - 2 VIEW  Comparison: 10/03/2011  Findings: Emphysematous changes.  Mild lower lobe opacities. Aortic tortuosity and scattered atherosclerosis.  Heart size upper normal.  No definite pleural effusion or pneumothorax.  Multilevel degenerative changes.  IMPRESSION: Mild lung base opacities; atelectasis, aspiration, or infiltrate.  Emphysematous changes.   Original Report Authenticated By: Waneta Martins, M.D.    Dg Chest Port 1 View  01/02/2012  *RADIOLOGY REPORT*  Clinical Data: Chest pain  PORTABLE CHEST - 1 VIEW  Comparison: 12/13/2011  Findings: Heart size upper normal.  Central vascular congestion. Emphysematous changes.  Mild left lung base opacity. Small left pleural effusion not excluded.  No pneumothorax.  Advanced degenerative changes the right shoulder.  No acute osseous finding.  IMPRESSION: Heart size upper normal to mildly enlarged.  Mild left lung base opacity; atelectasis versus infiltrate.  Emphysema.   Original Report Authenticated By: Waneta Martins, M.D.     Microbiology: No results found for this or any previous visit (from the past 240 hour(s)).    Labs: Basic Metabolic Panel:  Lab 01/02/12 1610 01/02/12 0308  NA -- 137  K -- 3.9  CL -- 104  CO2 -- 25  GLUCOSE -- 118*  BUN -- 27*  CREATININE 1.26 1.34  CALCIUM -- 8.7  MG -- --  PHOS -- --   Liver Function Tests: No results found for this basename: AST:5,ALT:5,ALKPHOS:5,BILITOT:5,PROT:5,ALBUMIN:5 in the last 168 hours No results found for this basename: LIPASE:5,AMYLASE:5 in the last 168 hours No results found for this basename: AMMONIA:5 in the last 168 hours CBC:  Lab 01/02/12 0701 01/02/12 0308  WBC 5.1 5.3  NEUTROABS -- 3.4  HGB 11.8* 12.0*  HCT 34.5* 35.9*  MCV 93.5 93.5  PLT 129* 143*   Cardiac Enzymes:  Lab 01/02/12 1825 01/02/12 1319 01/02/12 0701  CKTOTAL -- -- --  CKMB -- -- --  CKMBINDEX -- -- --  TROPONINI <0.30 <0.30 <0.30   BNP: BNP (last 3 results) No results found for this basename: PROBNP:3 in the last 8760 hours CBG: No results found for this basename: GLUCAP:5 in the last 168 hours     Signed:  Kela Millin  [facility] 01/03/2012, 12:09 PM

## 2012-01-03 NOTE — Plan of Care (Signed)
Problem: Phase I Progression Outcomes Goal: Voiding-avoid urinary catheter unless indicated Pt has chronic foley catheter for urinary rentention.

## 2012-01-03 NOTE — Progress Notes (Signed)
Patient Name: Thomas [name] Date of Encounter: 01/03/2012     Principal Problem:  *Chest pain Active Problems:  CORONARY ARTERY DISEASE  AORTIC STENOSIS, MILD  COPD (chronic obstructive pulmonary disease)    SUBJECTIVE  See Dr. Earmon Phoenix notes.  Patient is pain free and "ready to go".  Awaiting echo study today which I think will be helpful in long term follow up.  No present pain.  Enzymes negative.    CURRENT MEDS    . aspirin EC  325 mg Oral Daily  . diltiazem  180 mg Oral Daily  . enoxaparin (LOVENOX) injection  40 mg Subcutaneous Q24H  . fluticasone  2 spray Each Nare Daily  . l-methylfolate-B6-B12  1 tablet Oral Daily  . mometasone-formoterol  2 puff Inhalation BID  . multivitamin with minerals  1 tablet Oral Daily  . senna  3 tablet Oral QODAY  . sodium chloride  3 mL Intravenous Q12H  . sodium chloride  3 mL Intravenous Q12H  . Tamsulosin HCl  0.4 mg Oral BID  . trandolapril  4 mg Oral Daily    OBJECTIVE  Filed Vitals:   01/02/12 2100 01/02/12 2105 01/03/12 0500 01/03/12 0801  BP: 151/67  118/69   Pulse: 71  76   Temp: 98.1 F (36.7 C)  97.8 F (36.6 C)   TempSrc: Oral  Oral   Resp: 18  18   Height:      Weight:      SpO2: 95% 94% 94% 90%    Intake/Output Summary (Last 24 hours) at 01/03/12 0843 Last data filed at 01/03/12 0300  Gross per 24 hour  Intake      0 ml  Output   2000 ml  Net  -2000 ml   Filed Weights   01/02/12 0824  Weight: 143 lb 1.3 oz (64.9 kg)    PHYSICAL EXAM  General: Pleasant, NAD.  Elderly Neuro: Alert and oriented X 3. Moves all extremities spontaneously. Psych: Normal affect. HEENT:  Normal  Neck: Supple without bruits or JVD. Lungs:  Resp regular and unlabored, CTA. Heart: RRR no s3.  PMI non displaced.  SEM.  No DM Extremities: No clubbing, cyanosis or edema. DP/PT/Radials 2+ and equal bilaterally.  Accessory Clinical Findings  CBC  Basename 01/02/12 0701 01/02/12 0308  WBC 5.1 5.3  NEUTROABS -- 3.4    HGB 11.8* 12.0*  HCT 34.5* 35.9*  MCV 93.5 93.5  PLT 129* 143*   Basic Metabolic Panel  Basename 01/02/12 0701 01/02/12 0308  NA -- 137  K -- 3.9  CL -- 104  CO2 -- 25  GLUCOSE -- 118*  BUN -- 27*  CREATININE 1.26 1.34  CALCIUM -- 8.7  MG -- --  PHOS -- --   Liver Function Tests No results found for this basename: AST:2,ALT:2,ALKPHOS:2,BILITOT:2,PROT:2,ALBUMIN:2 in the last 72 hours No results found for this basename: LIPASE:2,AMYLASE:2 in the last 72 hours Cardiac Enzymes  Basename 01/02/12 1825 01/02/12 1319 01/02/12 0701  CKTOTAL -- -- --  CKMB -- -- --  CKMBINDEX -- -- --  TROPONINI <0.30 <0.30 <0.30   BNP No components found with this basename: POCBNP:3 D-Dimer No results found for this basename: DDIMER:2 in the last 72 hours Hemoglobin A1C No results found for this basename: HGBA1C in the last 72 hours Fasting Lipid Panel No results found for this basename: CHOL,HDL,LDLCALC,TRIG,CHOLHDL,LDLDIRECT in the last 72 hours Thyroid Function Tests No results found for this basename: TSH,T4TOTAL,FREET3,T3FREE,THYROIDAB in the last 72 hours  Radiology/Studies  Dg Chest 2 View (if Patient Has Fever And/or Copd)  12/13/2011  *RADIOLOGY REPORT*  Clinical Data: Shortness of breath  CHEST - 2 VIEW  Comparison: 10/03/2011  Findings: Emphysematous changes.  Mild lower lobe opacities. Aortic tortuosity and scattered atherosclerosis.  Heart size upper normal.  No definite pleural effusion or pneumothorax.  Multilevel degenerative changes.  IMPRESSION: Mild lung base opacities; atelectasis, aspiration, or infiltrate.  Emphysematous changes.   Original Report Authenticated By: Waneta Martins, M.D.    Dg Chest Port 1 View  01/02/2012  *RADIOLOGY REPORT*  Clinical Data: Chest pain  PORTABLE CHEST - 1 VIEW  Comparison: 12/13/2011  Findings: Heart size upper normal.  Central vascular congestion. Emphysematous changes.  Mild left lung base opacity. Small left pleural effusion  not excluded.  No pneumothorax.  Advanced degenerative changes the right shoulder.  No acute osseous finding.  IMPRESSION: Heart size upper normal to mildly enlarged.  Mild left lung base opacity; atelectasis versus infiltrate.  Emphysema.   Original Report Authenticated By: Waneta Martins, M.D.     ASSESSMENT AND PLAN   1.  Chest pain  -  Uncertain cause 2.  Aortic stenosis  - prob moderately severe.  See rec per Dr. Excell Seltzer  ---   He will likely need close follow up with primary, and could see [facility] for follow up.   Signed, Shawnie Pons MD, [facility], FSCAI

## 2012-01-03 NOTE — Progress Notes (Signed)
Echocardiogram 2D Echocardiogram has been performed.  Stone Spirito 01/03/2012, 10:35 AM

## 2012-01-09 ENCOUNTER — Telehealth: Payer: Self-pay | Admitting: Family Medicine

## 2012-01-09 NOTE — Telephone Encounter (Signed)
Pt left a voice message and needs a refill on Dulera.

## 2012-01-09 NOTE — Telephone Encounter (Signed)
Pt called back and he already has refills.

## 2012-01-10 NOTE — Telephone Encounter (Signed)
Debbie Sivret w/ [facility] needs verbal order to continue PT 3 X wk for 4 wks. Also needs verbal order to start OT for bathing safety. Pls call at [phone]

## 2012-01-11 NOTE — Telephone Encounter (Signed)
I called [place] and left a voice message with the below information.

## 2012-01-16 DIAGNOSIS — Z466 Encounter for fitting and adjustment of urinary device: Secondary | ICD-10-CM

## 2012-01-16 DIAGNOSIS — J441 Chronic obstructive pulmonary disease with (acute) exacerbation: Secondary | ICD-10-CM

## 2012-01-16 DIAGNOSIS — I2510 Atherosclerotic heart disease of native coronary artery without angina pectoris: Secondary | ICD-10-CM

## 2012-01-16 DIAGNOSIS — J189 Pneumonia, unspecified organism: Secondary | ICD-10-CM

## 2012-01-16 DIAGNOSIS — J438 Other emphysema: Secondary | ICD-10-CM

## 2012-01-16 DIAGNOSIS — H543 Unqualified visual loss, both eyes: Secondary | ICD-10-CM

## 2012-02-03 ENCOUNTER — Telehealth: Payer: Self-pay | Admitting: Family Medicine

## 2012-02-03 NOTE — Telephone Encounter (Signed)
I left a detailed voice message with the below information for Thomas [name].

## 2012-02-03 NOTE — Telephone Encounter (Signed)
Pt needs a home health aid order. Call in verbal order to [facility] [phone] ext [phone]. Pt is still weak and needs help with bathing.

## 2012-02-03 NOTE — Telephone Encounter (Signed)
Okay for this

## 2012-02-06 ENCOUNTER — Telehealth: Payer: Self-pay | Admitting: Family Medicine

## 2012-02-06 NOTE — Telephone Encounter (Signed)
Thomas [name] called and wanted to extend order to twice a wk for 6 wks.  FYI -Pts spouse fell and is in hospital, so pt is at home alone, but pts family  Is checking on pt daily.

## 2012-02-06 NOTE — Telephone Encounter (Signed)
Please okay this extension

## 2012-02-07 NOTE — Telephone Encounter (Signed)
I called [facility] and left a voice message with the below message.

## 2012-02-21 ENCOUNTER — Other Ambulatory Visit: Payer: Self-pay | Admitting: Urology

## 2012-02-24 ENCOUNTER — Telehealth: Payer: Self-pay | Admitting: Cardiology

## 2012-02-24 ENCOUNTER — Telehealth: Payer: Self-pay | Admitting: Family Medicine

## 2012-02-24 NOTE — Telephone Encounter (Signed)
Per pt he has been cleared by Dr Wall(cardiologist) for bladder surgery. Pt would like to know whether Dr Clent Ridges will clearance him for bladder surgery in Jan 2014 with out an appt.

## 2012-02-24 NOTE — Telephone Encounter (Signed)
New Problem:    Patient called in needing surgical clearance for an upcoming procedure.  Please call back.

## 2012-02-24 NOTE — Telephone Encounter (Signed)
Yes as long as Dr. Daleen Squibb has cleared him, then I can clear him also.

## 2012-02-24 NOTE — Telephone Encounter (Signed)
Follow-up:    Patient called in to say that the physician doing the TERP procedure is Dr. Felicity Pellegrini many [phone] ext. [phone]

## 2012-02-24 NOTE — Telephone Encounter (Signed)
Pt aware clearance form faxed to Dr. [name] earlier today. Mylo Red RN

## 2012-02-24 NOTE — Telephone Encounter (Signed)
I left voice message with below information. 

## 2012-02-27 ENCOUNTER — Telehealth: Payer: Self-pay | Admitting: Cardiology

## 2012-02-27 NOTE — Telephone Encounter (Signed)
Pt needs letter of clearance that states pt can have surgery the one they have now states pt os no longer on plavix but it says nothing regarding he can have surgery

## 2012-02-29 ENCOUNTER — Ambulatory Visit: Payer: Medicare Other | Admitting: Family Medicine

## 2012-02-29 ENCOUNTER — Encounter: Payer: Self-pay | Admitting: *Deleted

## 2012-03-01 NOTE — Telephone Encounter (Signed)
Letter faxed 02/29/12 Mylo Red RN

## 2012-03-22 ENCOUNTER — Telehealth: Payer: Self-pay | Admitting: Family Medicine

## 2012-03-22 DIAGNOSIS — J438 Other emphysema: Secondary | ICD-10-CM

## 2012-03-22 DIAGNOSIS — J189 Pneumonia, unspecified organism: Secondary | ICD-10-CM

## 2012-03-22 DIAGNOSIS — J441 Chronic obstructive pulmonary disease with (acute) exacerbation: Secondary | ICD-10-CM

## 2012-03-22 DIAGNOSIS — I2510 Atherosclerotic heart disease of native coronary artery without angina pectoris: Secondary | ICD-10-CM

## 2012-03-22 DIAGNOSIS — H543 Unqualified visual loss, both eyes: Secondary | ICD-10-CM

## 2012-03-22 DIAGNOSIS — Z466 Encounter for fitting and adjustment of urinary device: Secondary | ICD-10-CM

## 2012-03-22 MED ORDER — FLUTICASONE PROPIONATE 50 MCG/ACT NA SUSP: 2.0000 | Freq: Every day | NASAL | Status: DC

## 2012-03-22 NOTE — Telephone Encounter (Signed)
Refill request for Nasal spray and I did send script e-scribe

## 2012-03-30 ENCOUNTER — Telehealth: Payer: Self-pay | Admitting: Family Medicine

## 2012-03-30 NOTE — Telephone Encounter (Signed)
Caller: [name]; Phone: [phone]; Reason for Call: Thomas [name]- states that her husband , Mr.  Thomas [name] - would like staff review his medications.  He wants to make sure he is taking all the right medication.  However, he is currently sleeping.  Please contact for medication reconciliation.

## 2012-03-30 NOTE — Telephone Encounter (Signed)
Please do so

## 2012-04-02 NOTE — Telephone Encounter (Signed)
I spoke with pt's wife and she is going to pick up the medication list that I printed out.

## 2012-04-03 ENCOUNTER — Telehealth: Payer: Self-pay | Admitting: Family Medicine

## 2012-04-03 MED ORDER — TEMAZEPAM 30 MG PO CAPS: 30.0000 mg | ORAL_CAPSULE | Freq: Every evening | ORAL | Status: DC | PRN

## 2012-04-03 NOTE — Telephone Encounter (Signed)
He wants to increase the strength of his sleeping med

## 2012-04-17 ENCOUNTER — Telehealth: Payer: Self-pay | Admitting: Family Medicine

## 2012-04-17 NOTE — Telephone Encounter (Signed)
Please advise 

## 2012-04-17 NOTE — Telephone Encounter (Signed)
Opened in error/kjh 

## 2012-04-17 NOTE — Telephone Encounter (Signed)
Patient Information:  Caller Name: [name]  Phone: [phone]  Patient: Thomas [name], Thomas [name]  Gender: Male  DOB: [birth year]  Age: 79 Years  PCP: [facility]  Office Follow Up:  Does the office need to follow up with this patient?: Yes  Instructions For The Office: Caller wants pt evaluated but afraid to brng pt to the office d/t potential exposure to the flu.  Caller  a Dr. Clent Ridges.  Please follow up with caller for an appt.  She is hoping to get the last appt of the day or first.  Thank You.    Symptoms  Reason For Call & Symptoms: Wife calling that pt is getting weaker,  Pt haivng a hard to time getting up and down.  His is having weakness in arms and legs.  Caller says he seems to be normal except for the extreme weakness and memory loss.   Pt at times unable to get himself out of the chait to his walker. Today he seems to be better.  Pt is scheduled for prostate surgery 2/12.  Caller not sure of pt's heart rate.   She is keeping pt hydrated. Pt was receiving physcial therapy and it stopped 3 wks ago and since caller has noted a decrease in his activity and strength.  Reviewed Health History In EMR: Yes  Reviewed Medications In EMR: Yes  Reviewed Allergies In EMR: Yes  Reviewed Surgeries / Procedures: Yes  Date of Onset of Symptoms: Unknown  Guideline(s) Used:  Weakness (Generalized) and Fatigue  Disposition Per Guideline:   See Today in Office  Reason For Disposition Reached:   Moderate weakness (i.e., interferes with work, school, normal activities) and persists > 3 days  Advice Given:  Reassurance  Weakness often accompanies viral illnesses (e.g., colds and flu).  Call Back If:  Unable to stand or walk  Breathing difficulty occurs  You become worse.  Patient Refused Recommendation:  Patient Refused Appt, Patient Requests Appt At Later Date  Per Caller office is full of flu pts and she doesn't want her husband exposed.

## 2012-04-18 ENCOUNTER — Telehealth: Payer: Self-pay | Admitting: Family Medicine

## 2012-04-18 NOTE — Telephone Encounter (Signed)
Pt would like a list of current medication. Pt is going in hospital and will sent some one to pick up list in a hour or so.

## 2012-04-18 NOTE — Telephone Encounter (Signed)
I printed list and sent up front.

## 2012-04-18 NOTE — Telephone Encounter (Signed)
I understand that they are actually going to the ER

## 2012-04-20 ENCOUNTER — Ambulatory Visit (HOSPITAL_COMMUNITY)
Admission: RE | Admit: 2012-04-20 | Discharge: 2012-04-20 | Disposition: A | Discharge status: Home / Self Care | Payer: Medicare Other | Source: Ambulatory Visit | Attending: Urology | Admitting: Urology

## 2012-04-20 ENCOUNTER — Encounter (HOSPITAL_COMMUNITY)
Admission: RE | Admit: 2012-04-20 | Discharge: 2012-04-20 | Disposition: A | Discharge status: Home / Self Care | Payer: Medicare Other | Source: Ambulatory Visit | Attending: Urology | Admitting: Urology

## 2012-04-20 ENCOUNTER — Encounter (HOSPITAL_COMMUNITY): Payer: Self-pay

## 2012-04-20 DIAGNOSIS — Z9861 Coronary angioplasty status: Secondary | ICD-10-CM | POA: Insufficient documentation

## 2012-04-20 DIAGNOSIS — I2510 Atherosclerotic heart disease of native coronary artery without angina pectoris: Secondary | ICD-10-CM | POA: Insufficient documentation

## 2012-04-20 DIAGNOSIS — N189 Chronic kidney disease, unspecified: Secondary | ICD-10-CM | POA: Insufficient documentation

## 2012-04-20 DIAGNOSIS — I252 Old myocardial infarction: Secondary | ICD-10-CM | POA: Insufficient documentation

## 2012-04-20 DIAGNOSIS — J4489 Other specified chronic obstructive pulmonary disease: Secondary | ICD-10-CM | POA: Insufficient documentation

## 2012-04-20 DIAGNOSIS — Z01812 Encounter for preprocedural laboratory examination: Secondary | ICD-10-CM | POA: Insufficient documentation

## 2012-04-20 DIAGNOSIS — Z01818 Encounter for other preprocedural examination: Secondary | ICD-10-CM | POA: Insufficient documentation

## 2012-04-20 DIAGNOSIS — I129 Hypertensive chronic kidney disease with stage 1 through stage 4 chronic kidney disease, or unspecified chronic kidney disease: Secondary | ICD-10-CM | POA: Insufficient documentation

## 2012-04-20 DIAGNOSIS — J449 Chronic obstructive pulmonary disease, unspecified: Secondary | ICD-10-CM | POA: Insufficient documentation

## 2012-04-20 HISTORY — DX: Benign prostatic hyperplasia without lower urinary tract symptoms: N40.0

## 2012-04-20 LAB — BASIC METABOLIC PANEL
CO2: 26 mEq/L (ref 19–32)
Chloride: 102 mEq/L (ref 96–112)
Creatinine, Ser: 1.53 mg/dL — ABNORMAL HIGH (ref 0.50–1.35)
Sodium: 139 mEq/L (ref 135–145)

## 2012-04-20 LAB — CBC
MCV: 95.3 fL (ref 78.0–100.0)
Platelets: 228 10*3/uL (ref 150–400)
RBC: 4.46 MIL/uL (ref 4.22–5.81)
WBC: 7.7 10*3/uL (ref 4.0–10.5)

## 2012-04-20 NOTE — Patient Instructions (Addendum)
YOUR SURGERY IS SCHEDULED AT [facility]  ON:   Thursday  2/12  REPORT TO [facility] SHORT STAY CENTER AT:  9:00 AM      PHONE # FOR SHORT STAY IS [phone]  DO NOT EAT OR DRINK ANYTHING AFTER MIDNIGHT THE NIGHT BEFORE YOUR SURGERY.  YOU MAY BRUSH YOUR TEETH, RINSE OUT YOUR MOUTH--BUT NO WATER, NO FOOD, NO CHEWING GUM, NO MINTS, NO CANDIES, NO CHEWING TOBACCO.  PLEASE TAKE THE FOLLOWING MEDICATIONS THE AM OF YOUR SURGERY WITH A FEW SIPS OF WATER:   TAKE YOUR FLOMAX AND DILTIAZEM.  MAY USE YOUR NASAL SPRAY--FLUTICASONE.   USE YOUR DULERA INHALER.   BRING YOUR EYE DROPS AND INHALER TO THE HOSPITAL      DO NOT BRING VALUABLES, MONEY, CREDIT CARDS.  DO NOT WEAR JEWELRY, MAKE-UP, NAIL POLISH AND NO METAL PINS OR CLIPS IN YOUR HAIR. CONTACT LENS, DENTURES / PARTIALS, GLASSES SHOULD NOT BE WORN TO SURGERY AND IN MOST CASES-HEARING AIDS WILL NEED TO BE REMOVED.  BRING YOUR GLASSES CASE, ANY EQUIPMENT NEEDED FOR YOUR CONTACT LENS. FOR PATIENTS ADMITTED TO THE HOSPITAL--CHECK OUT TIME THE DAY OF DISCHARGE IS 11:00 AM.  ALL INPATIENT ROOMS ARE PRIVATE - WITH BATHROOM, TELEPHONE, TELEVISION AND WIFI INTERNET.                              PLEASE READ OVER ANY  FACT SHEETS THAT YOU WERE GIVEN: MRSA INFORMATION, BLOOD TRANSFUSION INFORMATION, INCENTIVE SPIROMETER INFORMATION. FAILURE TO FOLLOW THESE INSTRUCTIONS MAY RESULT IN THE CANCELLATION OF YOUR SURGERY.   PATIENT SIGNATURE_________________________________

## 2012-04-20 NOTE — Progress Notes (Signed)
04/20/12 1818  OBSTRUCTIVE SLEEP APNEA  Have you ever been diagnosed with sleep apnea through a sleep study? No  Do you snore loudly (loud enough to be heard through closed doors)?  0  Do you often feel tired, fatigued, or sleepy during the daytime? 1  Has anyone observed you stop breathing during your sleep? 0  Do you have, or are you being treated for high blood pressure? 1  BMI more than 35 kg/m2? 0  Age over 77 years old? 1  Neck circumference greater than 40 cm/18 inches? 0  Gender: 1  Obstructive Sleep Apnea Score 4   Score 4 or greater  Results sent to PCP

## 2012-04-20 NOTE — Pre-Procedure Instructions (Signed)
CBC, BMET, CXR WERE DONE TODAY AT [facility] AS PER ANESTHESIOLOGIST'S GUIDELINES.  EKG REPORT IN EPIC FROM 01/02/12.  CARDIOLOGY OFFICE NOTE IN EPIC FROM 12/08/11. PT'S PREOP BMET REPORT FAXED TO DR. [name]'S OFFICE.

## 2012-04-24 ENCOUNTER — Telehealth: Payer: Self-pay | Admitting: Family Medicine

## 2012-04-24 MED ORDER — TRANDOLAPRIL 4 MG PO TABS: 4.0000 mg | ORAL_TABLET | Freq: Every day | ORAL | Status: DC

## 2012-04-24 MED ORDER — GENTAMICIN SULFATE 40 MG/ML IJ SOLN
330.0000 mg | Freq: Once | INTRAVENOUS | Status: AC
  Administered 2012-04-25: 330 mg via INTRAVENOUS
  Filled 2012-04-24 (×2): qty 8.25

## 2012-04-24 NOTE — Telephone Encounter (Signed)
Script was sent e-scribe 

## 2012-04-24 NOTE — Telephone Encounter (Signed)
Pt needs refill on mavik 4 mg call into [facility]

## 2012-04-25 ENCOUNTER — Encounter (HOSPITAL_COMMUNITY): Payer: Self-pay | Admitting: Anesthesiology

## 2012-04-25 ENCOUNTER — Encounter (HOSPITAL_COMMUNITY): Payer: Self-pay | Admitting: *Deleted

## 2012-04-25 ENCOUNTER — Ambulatory Visit (HOSPITAL_COMMUNITY): Payer: Medicare Other | Admitting: Anesthesiology

## 2012-04-25 ENCOUNTER — Observation Stay (HOSPITAL_COMMUNITY)
Admission: RE | Admit: 2012-04-25 | Discharge: 2012-04-27 | Disposition: A | Discharge status: Home / Self Care | Payer: Medicare Other | Source: Ambulatory Visit | Attending: Urology | Admitting: Urology

## 2012-04-25 ENCOUNTER — Encounter (HOSPITAL_COMMUNITY)
Admission: RE | Disposition: A | Discharge status: Home / Self Care | Payer: Self-pay | Source: Ambulatory Visit | Attending: Urology

## 2012-04-25 DIAGNOSIS — I10 Essential (primary) hypertension: Secondary | ICD-10-CM | POA: Insufficient documentation

## 2012-04-25 DIAGNOSIS — N401 Enlarged prostate with lower urinary tract symptoms: Secondary | ICD-10-CM | POA: Insufficient documentation

## 2012-04-25 DIAGNOSIS — N329 Bladder disorder, unspecified: Secondary | ICD-10-CM | POA: Insufficient documentation

## 2012-04-25 DIAGNOSIS — Z8551 Personal history of malignant neoplasm of bladder: Secondary | ICD-10-CM | POA: Insufficient documentation

## 2012-04-25 DIAGNOSIS — Z79899 Other long term (current) drug therapy: Secondary | ICD-10-CM | POA: Insufficient documentation

## 2012-04-25 DIAGNOSIS — Z7902 Long term (current) use of antithrombotics/antiplatelets: Secondary | ICD-10-CM | POA: Insufficient documentation

## 2012-04-25 DIAGNOSIS — Z7982 Long term (current) use of aspirin: Secondary | ICD-10-CM | POA: Insufficient documentation

## 2012-04-25 DIAGNOSIS — Z96659 Presence of unspecified artificial knee joint: Secondary | ICD-10-CM | POA: Insufficient documentation

## 2012-04-25 DIAGNOSIS — N138 Other obstructive and reflux uropathy: Principal | ICD-10-CM | POA: Insufficient documentation

## 2012-04-25 DIAGNOSIS — I252 Old myocardial infarction: Secondary | ICD-10-CM | POA: Insufficient documentation

## 2012-04-25 DIAGNOSIS — E785 Hyperlipidemia, unspecified: Secondary | ICD-10-CM | POA: Insufficient documentation

## 2012-04-25 DIAGNOSIS — R339 Retention of urine, unspecified: Secondary | ICD-10-CM | POA: Insufficient documentation

## 2012-04-25 DIAGNOSIS — K219 Gastro-esophageal reflux disease without esophagitis: Secondary | ICD-10-CM | POA: Insufficient documentation

## 2012-04-25 DIAGNOSIS — I2510 Atherosclerotic heart disease of native coronary artery without angina pectoris: Secondary | ICD-10-CM | POA: Insufficient documentation

## 2012-04-25 HISTORY — PX: TRANSURETHRAL RESECTION OF PROSTATE: SHX73

## 2012-04-25 HISTORY — PX: CYSTOSCOPY WITH BIOPSY: SHX5122

## 2012-04-25 SURGERY — TRANSURETHRAL RESECTION OF THE PROSTATE WITH GYRUS INSTRUMENTS
Anesthesia: General | Wound class: Clean Contaminated

## 2012-04-25 MED ORDER — LACTATED RINGERS IV SOLN
INTRAVENOUS | Status: DC
  Administered 2012-04-25: 1000 mL via INTRAVENOUS

## 2012-04-25 MED ORDER — HYDROMORPHONE HCL PF 1 MG/ML IJ SOLN
0.5000 mg | INTRAMUSCULAR | Status: DC | PRN
Start: 2012-04-25 — End: 2012-04-27

## 2012-04-25 MED ORDER — DILTIAZEM HCL ER 180 MG PO CP24
180.0000 mg | ORAL_CAPSULE | Freq: Every morning | ORAL | Status: DC
  Administered 2012-04-26: 180 mg via ORAL
  Filled 2012-04-25 (×2): qty 1

## 2012-04-25 MED ORDER — TRANDOLAPRIL 4 MG PO TABS
4.0000 mg | ORAL_TABLET | Freq: Every day | ORAL | Status: DC
  Administered 2012-04-26: 4 mg via ORAL
  Filled 2012-04-25 (×2): qty 1

## 2012-04-25 MED ORDER — PROPOFOL 10 MG/ML IV BOLUS
INTRAVENOUS | Status: DC | PRN
  Administered 2012-04-25: 10 mg via INTRAVENOUS
  Administered 2012-04-25: 20 mg via INTRAVENOUS

## 2012-04-25 MED ORDER — ACETAMINOPHEN 10 MG/ML IV SOLN
INTRAVENOUS | Status: AC
  Filled 2012-04-25: qty 100

## 2012-04-25 MED ORDER — SENNA 8.6 MG PO TABS
1.0000 | ORAL_TABLET | Freq: Two times a day (BID) | ORAL | Status: DC
  Administered 2012-04-25 – 2012-04-26 (×2): 8.6 mg via ORAL
  Filled 2012-04-25 (×2): qty 1

## 2012-04-25 MED ORDER — ONDANSETRON HCL 4 MG/2ML IJ SOLN: 4.0000 mg | INTRAMUSCULAR | Status: DC | PRN

## 2012-04-25 MED ORDER — LACTATED RINGERS IV SOLN
INTRAVENOUS | Status: DC | PRN
  Administered 2012-04-25: 10:00:00 via INTRAVENOUS

## 2012-04-25 MED ORDER — MOMETASONE FURO-FORMOTEROL FUM 100-5 MCG/ACT IN AERO
2.0000 | INHALATION_SPRAY | Freq: Two times a day (BID) | RESPIRATORY_TRACT | Status: DC
  Administered 2012-04-25 – 2012-04-27 (×3): 2 via RESPIRATORY_TRACT
  Filled 2012-04-25: qty 8.8

## 2012-04-25 MED ORDER — BUPIVACAINE IN DEXTROSE 0.75-8.25 % IT SOLN
INTRATHECAL | Status: DC | PRN
  Administered 2012-04-25: 1.5 mL via INTRATHECAL

## 2012-04-25 MED ORDER — TEMAZEPAM 15 MG PO CAPS
30.0000 mg | ORAL_CAPSULE | Freq: Every evening | ORAL | Status: DC | PRN
  Administered 2012-04-25 – 2012-04-26 (×2): 30 mg via ORAL
  Filled 2012-04-25 (×2): qty 2

## 2012-04-25 MED ORDER — OXYBUTYNIN CHLORIDE 5 MG PO TABS
5.0000 mg | ORAL_TABLET | Freq: Three times a day (TID) | ORAL | Status: DC | PRN
  Filled 2012-04-25: qty 1

## 2012-04-25 MED ORDER — FLUTICASONE PROPIONATE 50 MCG/ACT NA SUSP
2.0000 | Freq: Every day | NASAL | Status: DC
Start: 2012-04-26 — End: 2012-04-27
  Administered 2012-04-26: 2 via NASAL
  Filled 2012-04-25: qty 16

## 2012-04-25 MED ORDER — TRAMADOL HCL 50 MG PO TABS
50.0000 mg | ORAL_TABLET | Freq: Four times a day (QID) | ORAL | Status: DC | PRN
  Administered 2012-04-26: 50 mg via ORAL
  Filled 2012-04-25: qty 1

## 2012-04-25 MED ORDER — PROPOFOL INFUSION 10 MG/ML OPTIME
INTRAVENOUS | Status: DC | PRN
  Administered 2012-04-25: 50 ug/kg/min via INTRAVENOUS

## 2012-04-25 MED ORDER — KCL IN DEXTROSE-NACL 20-5-0.45 MEQ/L-%-% IV SOLN
INTRAVENOUS | Status: DC
  Administered 2012-04-25: 16:00:00 via INTRAVENOUS
  Filled 2012-04-25 (×3): qty 1000

## 2012-04-25 MED ORDER — NITROGLYCERIN 0.4 MG SL SUBL: 0.4000 mg | SUBLINGUAL_TABLET | SUBLINGUAL | Status: DC | PRN

## 2012-04-25 MED ORDER — ASPIRIN EC 81 MG PO TBEC
81.0000 mg | DELAYED_RELEASE_TABLET | Freq: Every morning | ORAL | Status: DC
  Administered 2012-04-26: 81 mg via ORAL
  Filled 2012-04-25 (×2): qty 1

## 2012-04-25 MED ORDER — DOCUSATE SODIUM 100 MG PO CAPS
100.0000 mg | ORAL_CAPSULE | Freq: Two times a day (BID) | ORAL | Status: DC
  Administered 2012-04-25 – 2012-04-26 (×2): 100 mg via ORAL
  Filled 2012-04-25 (×5): qty 1

## 2012-04-25 MED ORDER — SODIUM CHLORIDE 0.9 % IR SOLN
Status: DC | PRN
  Administered 2012-04-25: 15000 mL via INTRAVESICAL

## 2012-04-25 MED ORDER — FENTANYL CITRATE 0.05 MG/ML IJ SOLN: 25.0000 ug | INTRAMUSCULAR | Status: DC | PRN

## 2012-04-25 MED ORDER — LATANOPROST 0.005 % OP SOLN
1.0000 [drp] | Freq: Every day | OPHTHALMIC | Status: DC
  Administered 2012-04-25 – 2012-04-26 (×2): 1 [drp] via OPHTHALMIC
  Filled 2012-04-25: qty 2.5

## 2012-04-25 SURGICAL SUPPLY — 21 items
BAG URINE DRAINAGE (UROLOGICAL SUPPLIES) ×2 IMPLANT
BAG URO CATCHER STRL LF (DRAPE) ×2 IMPLANT
BLADE SURG 15 STRL LF DISP TIS (BLADE) IMPLANT
BLADE SURG 15 STRL SS (BLADE)
CATH FOLEY 3WAY 30CC 22FR (CATHETERS) ×2 IMPLANT
CLOTH BEACON ORANGE TIMEOUT ST (SAFETY) ×2 IMPLANT
DRAPE CAMERA CLOSED 9X96 (DRAPES) ×2 IMPLANT
ELECT LOOP MED HF 24F 12D (CUTTING LOOP) ×1 IMPLANT
ELECT LOOP MED HF 24F 12D CBL (CLIP) IMPLANT
ELECT RESECT VAPORIZE 12D CBL (ELECTRODE) ×2 IMPLANT
GLOVE BIOGEL M STRL SZ7.5 (GLOVE) ×2 IMPLANT
GOWN STRL REIN XL XLG (GOWN DISPOSABLE) ×2 IMPLANT
HOLDER FOLEY CATH W/STRAP (MISCELLANEOUS) ×1 IMPLANT
KIT ASPIRATION TUBING (SET/KITS/TRAYS/PACK) IMPLANT
MANIFOLD NEPTUNE II (INSTRUMENTS) ×2 IMPLANT
PACK CYSTO (CUSTOM PROCEDURE TRAY) ×2 IMPLANT
PLUG CATH AND CAP STER (CATHETERS) ×1 IMPLANT
SUT ETHILON 3 0 PS 1 (SUTURE) IMPLANT
SYR 30ML LL (SYRINGE) ×1 IMPLANT
SYRINGE IRR TOOMEY STRL 70CC (SYRINGE) IMPLANT
TUBING CONNECTING 10 (TUBING) ×2 IMPLANT

## 2012-04-25 NOTE — Anesthesia Procedure Notes (Signed)
Spinal  Patient location during procedure: OR Staffing Anesthesiologist: Azell Der Performed by: anesthesiologist  Preanesthetic Checklist Completed: patient identified, site marked, surgical consent, pre-op evaluation, timeout performed, IV checked, risks and benefits discussed and monitors and equipment checked Spinal Block Patient position: sitting Prep: Betadine Patient monitoring: heart rate, cardiac monitor, continuous pulse ox and blood pressure Approach: midline Location: L3-4 Injection technique: single-shot Needle Needle type: Quincke  Needle gauge: 22 G Needle length: 9 cm Additional Notes Clear CSF obtained. No paresthesia. Tolerated well. Adequate level.

## 2012-04-25 NOTE — Anesthesia Postprocedure Evaluation (Signed)
  Anesthesia Post-op Note  Patient: Thomas [name]  Procedure(s) Performed: Procedure(s) (LRB): TRANSURETHRAL RESECTION OF THE PROSTATE WITH GYRUS INSTRUMENTS (N/A) CYSTOSCOPY WITH BIOPSY (N/A)  Patient Location: PACU  Anesthesia Type: Spinal  Level of Consciousness: awake and alert   Airway and Oxygen Therapy: Patient Spontanous Breathing  Post-op Pain: mild  Post-op Assessment: Post-op Vital signs reviewed, Patient's Cardiovascular Status Stable, Respiratory Function Stable, Patent Airway and No signs of Nausea or vomiting  Last Vitals:  Filed Vitals:   04/25/12 1315  BP: 143/56  Pulse: 62  Temp:   Resp: 16    Post-op Vital Signs: stable   Complications: No apparent anesthesia complications. Moving both feet. No complaints.

## 2012-04-25 NOTE — H&P (Signed)
Thomas [name] is an 79 y.o. male.    Chief Complaint: Pre-op TURP and Bladder Biopsy  HPI:   1 - Urinary Retention - Pt with long h/o LUTS on various medical therapy on/off for years. Most recently Tamsulosin 0.8mg . Despite this, he has been in urinary retention now for some time and failed several trials of void. He had urodynamics by Dr. Dierdre Harness that demostrated mixed picture of weak bladder and obstruction wtih PDet Max of 25 and QM 2mL, but clearly preserved detrussor contractions (not just valsalva). Cysto confirmed bilobar, but not massive prostatic hypertrophy. DRE 80gm.   He has been extensively couseled about possible TURP with benefits being potentially catheter-free, but risks being continued retention or new stress or urge incontinence. His goal is to become catheter free if at all possible.  2 - Bladder Mass - Pt with known h/o bladder cancer managed by Dr. Earlene Plater is past. Unknown grade/stage. Office cysto by MacDiarmid raises suspicion of possibe recurrence, though hard to say in setting of expected foley edema. CT 02/2012 wihtout obvious pelvic masses or adenopathy.  PMH sig for CAD/Stent (on Plavix + ASA), prior CEA, Appendectomy. No other chest or abdominal surgeries.  Today Quinterious is seen to proceed with TURP and bladder biopsy. His most recent UCX confirmed colonization with Klebsiella (expected with chronic foley) but without fevers. He has been on CX-specific therapy for this. He has held his Plavix for >5 days as directed by his cardiologist.  Past Medical History  Diagnosis Date  . Mild aortic stenosis     last echo 08/2006  . Coronary artery disease     s/p PCI in 1995; Cath 2005 showed moderate 2 vessel CAD involving the distal Left main & LCx/OM, treated medically  . Myocardial infarction   . Hyperlipidemia   . Hypertension   . Vitamin B12 deficiency   . Neuropathy   . Allergic rhinitis   . OA (osteoarthritis)   . History of bladder cancer   . GERD  (gastroesophageal reflux disease)   . Esophageal stricture   . Chronic kidney disease     over active bladder  . BPH (benign prostatic hypertrophy)     URINARY RETENTION-HAS INDWELLING FOLEY CATHETER    Past Surgical History  Procedure Laterality Date  . Coronary angioplasty    . Appendectomy    . Cholecystectomy    . Tonsillectomy    . Sigmoidoscopy  1999  . Colonoscopy    . Bilateral elbow surgery    . Left knee replacement    . Carotid endarterectomy  08/2008  . Esophagogastroduodenoscopy      dilation    Family History  Problem Relation Age of Onset  . Coronary artery disease Brother   . Prostate cancer      first degree relative   Social History:  reports that he quit smoking about 62 years ago. He has never used smokeless tobacco. He reports that he does not drink alcohol or use illicit drugs.  Allergies:  Allergies  Allergen Reactions  . Sulfamethoxazole     REACTION: unspecified    No prescriptions prior to admission    No results found for this or any previous visit (from the past 48 hour(s)). No results found.  Review of Systems  Constitutional: Negative.  Negative for fever and chills.  HENT: Negative.   Eyes: Negative.   Respiratory: Negative.   Cardiovascular: Negative.   Gastrointestinal: Negative.   Genitourinary: Negative.   Musculoskeletal: Negative.   Skin: Negative.  Neurological: Negative.   Endo/Heme/Allergies: Negative.   Psychiatric/Behavioral: Negative.     There were no vitals taken for this visit. Physical Exam  Constitutional: He is oriented to person, place, and time. He appears well-developed and well-nourished.  HENT:  Head: Normocephalic and atraumatic.  Eyes: EOM are normal. Pupils are equal, round, and reactive to light.  Neck: Normal range of motion. Neck supple.  Cardiovascular: Normal rate.   Respiratory: Effort normal.  GI: Soft. Bowel sounds are normal.  Genitourinary: Penis normal.  Foley in place, c/d/i.   Musculoskeletal: Normal range of motion.  Neurological: He is alert and oriented to person, place, and time.  Skin: Skin is warm and dry.  Psychiatric: He has a normal mood and affect. His behavior is normal. Judgment and thought content normal.     Assessment/Plan  1 - Urinary Retention - We re-discussed the goal of  TURP to hopefully get him catheter free. I estimated surgical success about 60-80% range in this complex situation.   We re-discussed options for medical refractory prostatic outlet obstruction including TURP, TUNA, TUMT, Green-Light, Ho-LEP, and simple prostatectomy with their respective risks, benefits, and long-term outcomes data. Pt has opted for TURP. We discussed the typical peri-operative course with overnight admission and discharge with foley in place with subsequent office voiding trial few days later. We discussed risks including bleeding, infection, incontinence, need for repeat procedures / tissue regrowth over time as well as rare risks including DVT, PE, MI, CVA and mortality.   2 - Bladder Mass - CT today wihtout obvious distant disease,will biopsy today at time of TURP to r/o malignancy.   Jolinda Pinkstaff 04/25/2012, 7:01 AM

## 2012-04-25 NOTE — Op Note (Signed)
NAME:  Thomas [name], BUR NO.:  [id number]  MEDICAL RECORD NO.:  [id number]  LOCATION:  1422                         FACILITY:  [facility]  PHYSICIAN:  Sebastian Ache, MD     DATE OF BIRTH:  05-15-1916  DATE OF PROCEDURE:04/25/2012 DATE OF DISCHARGE:                              OPERATIVE REPORT   PREOPERATIVE DIAGNOSIS:  Urinary retention, prostatic hypertrophy, history of bladder cancer and bladder mass.  PROCEDURE: 1. Transurethral vaporization of the prostate. 2. Bladder biopsy with fulguration.  ESTIMATED BLOOD LOSS:  50 mL.  COMPLICATIONS:  None.  SPECIMEN:  Posterior bladder wall erythema and papillary area for permanent pathology.  FINDINGS: 1. Posterior bladder wall erythema and papillary changes and worrisome     for possible neoplasm versus chronic catheter reaction. 2. Left greater than right bilobar prostatic hypertrophy.  INDICATION:  Thomas [name] is a very pleasant, elderly gentleman with  history of urinary retention.  He has been refractory to the medications including alpha blockers and 5-alpha reductase inhibitors. He had evaluation with cystoscopy and urodynamics by my colleague, Dr. Sherron Monday, was found to have left greater than right bilobar hypertrophy and preserved, although weak bladder contractions by urodynamics.  The patient's chief goal is to become catheter free.  We had counseled him about outlet procedure including transurethral resection versus a resection of the prostate as an option.  I extensively counseled him that given his relatively weak detrusor he may or may not be able to achieve catheter free status.  He adamantly wished to proceed.  On evaluation, the patient was also noted to have some posterior bladder wall erythema and papillary areas was very difficult to discern whether this represented recurrence of bladder tumor, which he has had previously versus catheter changes and it was felt that biopsy was warranted.   Informed consents was obtained and placed in medical record.  PROCEDURE IN DETAIL:  The patient being Thomas [name] and the procedure being transurethral vaporization of prostate and bladder biopsy was confirmed, procedure was carried out.  Time-out was performed. Intravenous antibiotics were administered.  Conscious sedation anesthesia was introduced after a spinal.  The patient was then placed into a low lithotomy position.  Sterile field was created by prepping and draping the patient's penis, perineum, and proximal thighs using iodine x3.  Next, cystourethroscopy was performed using a 22-French rigid cystoscope with 12-degree offset lens.  Inspection of anterior and posterior urethra revealed left greater than right mild bilobar hypertrophy.  No significant median lobe.  Inspection of urinary bladder revealed some erythematous changes in the posterior wall.  Total area approximately 2 cm2.  It was very difficult to tell whether this represented a catheter reaction versus neoplasm.  He did have also an old resection site at the superior aspect of the bladder consistent with prior known low-grade bladder cancer.  Ureteral orifices in normal anatomic position and well away from aspects of the prostate adenoma. Attention directed to bladder biopsy.  Cold cup biopsy forceps were used to carefully grasp the area of maximum papillary and erythematous change in the posterior bladder wall.  This was performed x2 and set aside for permanent pathology.  Coagulation current was applied to  these areas as well additional current ablating all erythematous and papillary changes using the button electrode, and gyrus resectoscope sheath with a bipolar energy and saline irrigation, this resulted in excellent hemostasis of the bladder biopsy sites and no evidence of bladder perforation.  Attention was then directed at crusting of the prostate.  The prostate again was not grossly enlarged, but there  was left greater than right prostate lobe hypertrophy as well that of a vaporization technique would be the safest for managing this. As such, transurethral vaporization of prostate was performed in a top- down fashion in the area of the bladder neck towards the area of the verumontanum taking great care not to ablate any tissue distal to the verumontanum.  The right side was first ablated towards the smaller side and followed by the left side, which was clearly the more dominant lobe that estimated approximately 20 g of ablated tissue.  Following these maneuvers, there was an excellent urinary channel without any protrusion of the prostatic lobes into the urinary channel.  There was excellent hemostasis.  The bladder was once again inspected.  There was no evidence of perforation.  The resectoscope was then exchanged for the for a 22-French 3-Foley catheter with 30 mL sterile water, this was actually to normal saline irrigation 1 drop per second with efflux being light pink.  Procedure was then terminated.  The patient tolerated the procedure well.  There were no immediate periprocedural complications. The patient was taken to postanesthesia care unit in stable condition.          ______________________________ Sebastian Ache, MD     TM/MEDQ  D:  04/25/2012  T:  04/25/2012  Job:  [id number]

## 2012-04-25 NOTE — Transfer of Care (Signed)
Immediate Anesthesia Transfer of Care Note  Patient: Thomas [name]  Procedure(s) Performed: Procedure(s): TRANSURETHRAL RESECTION OF THE PROSTATE WITH GYRUS INSTRUMENTS (N/A) CYSTOSCOPY WITH BIOPSY (N/A)  Patient Location: PACU  Anesthesia Type:Spinal  Level of Consciousness: awake, alert , oriented and patient cooperative  Airway & Oxygen Therapy: Patient Spontanous Breathing and Patient connected to face mask oxygen  Post-op Assessment: Report given to PACU RN and Post -op Vital signs reviewed and stable  Post vital signs: Reviewed and stable  Complications: No apparent anesthesia complications

## 2012-04-25 NOTE — Anesthesia Preprocedure Evaluation (Addendum)
Anesthesia Evaluation  Patient identified by MRN, date of birth, ID band Patient awake    Reviewed: Allergy & Precautions, H&P , NPO status , Patient's Chart, lab work & pertinent test results  Airway Mallampati: II TM Distance: >3 FB Neck ROM: Full    Dental no notable dental hx.    Pulmonary shortness of breath, pneumonia -, COPDformer smoker,  breath sounds clear to auscultation  Pulmonary exam normal       Cardiovascular hypertension, + CAD, + Past MI and + Peripheral Vascular Disease + Valvular Problems/Murmurs AS Rhythm:Regular Rate:Normal     Neuro/Psych  Neuromuscular disease negative psych ROS   GI/Hepatic Neg liver ROS, GERD-  ,  Endo/Other  negative endocrine ROS  Renal/GU Renal InsufficiencyRenal disease  negative genitourinary   Musculoskeletal negative musculoskeletal ROS (+)   Abdominal   Peds negative pediatric ROS (+)  Hematology negative hematology ROS (+)   Anesthesia Other Findings   Reproductive/Obstetrics negative OB ROS                           Anesthesia Physical Anesthesia Plan  ASA: IV  Anesthesia Plan: Spinal   Post-op Pain Management:    Induction: Intravenous  Airway Management Planned:   Additional Equipment:   Intra-op Plan:   Post-operative Plan: Extubation in OR  Informed Consent: I have reviewed the patients History and Physical, chart, labs and discussed the procedure including the risks, benefits and alternatives for the proposed anesthesia with the patient or authorized representative who has indicated his/her understanding and acceptance.   Dental advisory given  Plan Discussed with: CRNA  Anesthesia Plan Comments: (Mild Aortic stenosis. Plan spinal. Off plavix for a month)       Anesthesia Quick Evaluation

## 2012-04-25 NOTE — Brief Op Note (Signed)
04/25/2012  12:22 PM  PATIENT:  Thomas [name]  79 y.o. male  PRE-OPERATIVE DIAGNOSIS:  BENIGN PROSTATIC HYPERTROPHY, URINARY RETENTION, bladder mass  POST-OPERATIVE DIAGNOSIS:  BENIGN PROSTATIC HYPERTROPHY, URINARY RETENTION  PROCEDURE:  Transurethral Vaporization of Prostate (TUVP) and bladder biopsy / fulgeration.  SURGEON:  Surgeon(s) and Role:    * Sebastian Ache, MD - Primary  PHYSICIAN ASSISTANT:   ASSISTANTS: none   ANESTHESIA:   spinal and IV sedation  EBL:  Total I/O In: 800 [I.V.:800] Out: 250 [Urine:250]  BLOOD ADMINISTERED:none  DRAINS: 70F 3 way foley to continues irrigation with NS   LOCAL MEDICATIONS USED:  NONE  SPECIMEN:  Source of Specimen:  Posterior Bladder Wall  DISPOSITION OF SPECIMEN:  PATHOLOGY  COUNTS:  YES  TOURNIQUET:  * No tourniquets in log *  DICTATION: .Other Dictation: Dictation Number [id number]  PLAN OF CARE: Admit for overnight observation  PATIENT DISPOSITION:  PACU - hemodynamically stable.   Delay start of Pharmacological VTE agent (>24hrs) due to surgical blood loss or risk of bleeding: yes

## 2012-04-26 LAB — HEMOGLOBIN AND HEMATOCRIT, BLOOD
HCT: 37.3 % — ABNORMAL LOW (ref 39.0–52.0)
Hemoglobin: 12.5 g/dL — ABNORMAL LOW (ref 13.0–17.0)

## 2012-04-26 MED ORDER — SENNA-DOCUSATE SODIUM 8.6-50 MG PO TABS: 1.0000 | ORAL_TABLET | Freq: Two times a day (BID) | ORAL | Status: DC

## 2012-04-26 MED ORDER — TRAMADOL HCL 50 MG PO TABS: 50.0000 mg | ORAL_TABLET | Freq: Four times a day (QID) | ORAL | Status: DC | PRN

## 2012-04-26 MED ORDER — BISACODYL 10 MG RE SUPP
10.0000 mg | Freq: Every day | RECTAL | Status: DC | PRN
  Administered 2012-04-26: 10 mg via RECTAL
  Filled 2012-04-26: qty 1

## 2012-04-26 NOTE — Progress Notes (Signed)
UR completed 

## 2012-04-26 NOTE — Discharge Summary (Signed)
Physician Discharge Summary  Patient ID: Thomas [name] MRN: [id number] DOB/AGE: 07/23/1916 79 y.o.  Admit date: 04/25/2012 Discharge date: 04/26/2012  Admission Diagnoses: Urinary Retention, Enlarged Prostate, Bladder Mass, History of Bladder Cancer  Discharge Diagnoses:  Urinary Retention, Enlarged Prostate, Bladder Mass, History of Bladder Cancer   Discharged Condition: good  Hospital Course:   Pt underwent transurethral vaporization of the prostate with bladder biopsy and fulguration on the day of admission without acute complications for the goal of further characterizing a bladder lesions and decreasing urinary outlet obstruction. By POD 1, the day of discharge, his pain was controlled, he was ambulatory, his hgb stable at 12.5, and felt to be adequate for discharge.   Consults: None  Significant Diagnostic Studies: labs: Hgb 12.5 at discharge  Treatments: surgery:  transurethral vaporization of the prostate with bladder biopsy and fulguration   Discharge Exam: Blood pressure 148/59, pulse 71, temperature 97.9 F (36.6 C), temperature source Oral, resp. rate 16, height 5' 10.08" (1.78 m), weight 66.2 kg (145 lb 15.1 oz), SpO2 100.00%. General appearance: alert, cooperative and appears stated age Head: Normocephalic, without obvious abnormality, atraumatic Eyes: conjunctivae/corneas clear. PERRL, EOM's intact. Fundi benign. Ears: normal TM's and external ear canals both ears Nose: Nares normal. Septum midline. Mucosa normal. No drainage or sinus tenderness. Throat: lips, mucosa, and tongue normal; teeth and gums normal Neck: no adenopathy, no carotid bruit, no JVD, supple, symmetrical, trachea midline and thyroid not enlarged, symmetric, no tenderness/mass/nodules Back: symmetric, no curvature. ROM normal. No CVA tenderness. Resp: clear to auscultation bilaterally Chest wall: no tenderness Cardio: regular rate and rhythm, S1, S2 normal, no murmur, click, rub or gallop GI:  soft, non-tender; bowel sounds normal; no masses,  no organomegaly Male genitalia: normal, 3 Way foley c/d/i with very light pink urine OFF irigation. Extremities: extremities normal, atraumatic, no cyanosis or edema Pulses: 2+ and symmetric Skin: Skin color, texture, turgor normal. No rashes or lesions Lymph nodes: Cervical, supraclavicular, and axillary nodes normal. Neurologic: Grossly normal. Some stigmata of dementia (stable), but AO x 2.  Disposition: 01-Home or Self Care     Medication List    STOP taking these medications       clopidogrel 75 MG tablet  Commonly known as:  PLAVIX     Tamsulosin HCl 0.4 MG Caps  Commonly known as:  FLOMAX      TAKE these medications       aspirin EC 81 MG tablet  Take 81 mg by mouth every morning.     cephALEXin 500 MG capsule  Commonly known as:  KEFLEX  Take 500 mg by mouth 4 (four) times daily.     diltiazem 180 MG 24 hr capsule  Commonly known as:  DILACOR XR  Take 180 mg by mouth every morning.     fluticasone 50 MCG/ACT nasal spray  Commonly known as:  FLONASE  Place 2 sprays into the nose daily.     l-methylfolate-B6-B12 3-35-2 MG Tabs  Commonly known as:  METANX  Take 1 tablet by mouth daily.     latanoprost 0.005 % ophthalmic solution  Commonly known as:  XALATAN  Place 1 drop into both eyes at bedtime.     mometasone-formoterol 100-5 MCG/ACT Aero  Commonly known as:  DULERA  Inhale 2 puffs into the lungs 2 (two) times daily.     multivitamin with minerals Tabs  Take 1 tablet by mouth daily.     nitroGLYCERIN 0.4 MG SL tablet  Commonly known as:  NITROSTAT  Place 0.4 mg under the tongue every 5 (five) minutes as needed. For chest pain     senna 8.6 MG Tabs  Commonly known as:  SENOKOT  Take 3 tablets by mouth every other day. For constipation.     sennosides-docusate sodium 8.6-50 MG tablet  Commonly known as:  SENOKOT-S  Take 1 tablet by mouth 2 (two) times daily. While taking pain meds to prevent  constipation.     temazepam 30 MG capsule  Commonly known as:  RESTORIL  Take 1 capsule (30 mg total) by mouth at bedtime as needed for sleep.     traMADol 50 MG tablet  Commonly known as:  ULTRAM  Take 1 tablet (50 mg total) by mouth every 6 (six) hours as needed.     trandolapril 4 MG tablet  Commonly known as:  MAVIK  Take 1 tablet (4 mg total) by mouth daily.           Follow-up Information   Follow up with Sebastian Ache, MD On 04/30/2012. (10:15AM for MD visit and voiding trial)    Contact information:   [address] [place] [address] [phone]       Signed: Sebastian Ache 04/26/2012, 7:31 AM
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
## 2012-04-27 ENCOUNTER — Encounter (HOSPITAL_COMMUNITY): Payer: Self-pay | Admitting: Urology
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
## 2012-05-01 ENCOUNTER — Telehealth: Payer: Self-pay | Admitting: Family Medicine

## 2012-05-01 NOTE — Telephone Encounter (Signed)
Patient Information:  Caller Name: [name]  Phone: [phone]  Patient: Thomas [name], Thomas [name]  Gender: Male  DOB: [birth year]  Age: 79 Years  PCP: Gershon Crane [facility])  Office Follow Up:  Does the office need to follow up with this patient?: No  Instructions For The Office: N/A  RN Note:  Advised wife to hang up and call 911 and make sure she tells them that he is confused and this is not his "normal".  Wife verbalized understanding.  Symptoms  Reason For Call & Symptoms: Pt had TURP surgery and they removed the catheter yesterday and sent him home.  Pt is not able to control his urine.  He fell in the bathroom last night and she called 911 and they came out and evaluated and said he was ok.  Pt is confused and has been confused since last night.  Wife said this is not normal for him.  Reviewed Health History In EMR: N/A  Reviewed Medications In EMR: N/A  Reviewed Allergies In EMR: N/A  Reviewed Surgeries / Procedures: N/A  Date of Onset of Symptoms: 04/30/2012  Guideline(s) Used:  Confusion - Delirium  Disposition Per Guideline:   Call EMS 911 Now  Reason For Disposition Reached:   Difficult to awaken or acting confused (disoriented, slurred speech) and new onset  Advice Given:  N/A

## 2012-05-01 NOTE — Telephone Encounter (Signed)
Call Genevieve Norlander to have a Social Work consult to evaluate the situation

## 2012-05-01 NOTE — Telephone Encounter (Signed)
Pt fell last night and she had to have EMS come out and pt does not even remember this today. He is confused and wife says she needs some help with him. She wants someone to evaluate him.

## 2012-05-02 ENCOUNTER — Telehealth: Payer: Self-pay | Admitting: Family Medicine

## 2012-05-02 NOTE — Telephone Encounter (Signed)
I called the social worker Maryruth Hancock cell [phone] and left voice message with pt information. I will follow up again today to make sure that she got my message. I also spoke with pt's wife.

## 2012-05-02 NOTE — Telephone Encounter (Signed)
Patient Information:  Caller Name: Delray Alt  Phone: [phone]  Patient: Thomas [name], Thomas [name]  Gender: Male  DOB: [birth year]  Age: 79 Years  PCP: Gershon Crane [facility])  Office Follow Up:  Does the office need to follow up with this patient?: Yes  Instructions For The Office: Please review, contact wife at [phone].  RN Note:  Please review, contact wife at [phone].  Symptoms  Reason For Call & Symptoms: Also spoke with Mrs Hazelrigg.  Increased confusion and weakness for the last month, worse for 2 weeks.  Then Prostate Surgery on 2/12 and left catheter in until Mon 2/17 when it was removed and he fell in the bathroom that night, hitting head and face - called 911 to evaluate him and get him off the floor.  He can tell you who is president and various facts and 5 min later wont remember you were there.  Needing more frequent care,  but only assistance 3 hours per day.  Can't get out of bed or move around as before, wet beds.  Can't get out of chair.  This happening 2 weeks but worse since TURP.    vs normal.  Feels he needs help and had called Dr Claris Che office.  Got call back today that [facility] nurse is coming to assess him but has not heard from them.  Reviewed Health History In EMR: Yes  Reviewed Medications In EMR: Yes  Reviewed Allergies In EMR: Yes  Reviewed Surgeries / Procedures: Yes  Date of Onset of Symptoms: 04/18/2012  Guideline(s) Used:  Weakness (Generalized) and Fatigue  Confusion - Delirium  Disposition Per Guideline:   See Today or Tomorrow in Office  Reason For Disposition Reached:   Longstanding confusion (e.g., dementia, stroke) and worsening  Advice Given:  N/A  Patient Refused Recommendation:  Patient Will Follow Up With Office Later  Home waiting for call from [facility] nurse for evaluation.

## 2012-05-02 NOTE — Telephone Encounter (Signed)
Per Nettie Elm, order was faxed to [facility] around lunch time for nurse and a Child psychotherapist

## 2012-05-02 NOTE — Telephone Encounter (Signed)
I spoke with Thomas [name] and faxed over the order for nursing & social worker to evaluate. ( fax # [phone] )

## 2012-05-04 DIAGNOSIS — Z0279 Encounter for issue of other medical certificate: Secondary | ICD-10-CM

## 2012-05-07 ENCOUNTER — Ambulatory Visit (INDEPENDENT_AMBULATORY_CARE_PROVIDER_SITE_OTHER): Payer: Medicare Other | Admitting: Family Medicine

## 2012-05-07 ENCOUNTER — Encounter: Payer: Self-pay | Admitting: Family Medicine

## 2012-05-07 ENCOUNTER — Telehealth: Payer: Self-pay | Admitting: Family Medicine

## 2012-05-07 VITALS — BP 110/66 | HR 65 | Temp 97.4°F

## 2012-05-07 DIAGNOSIS — R5383 Other fatigue: Secondary | ICD-10-CM

## 2012-05-07 DIAGNOSIS — R5381 Other malaise: Secondary | ICD-10-CM

## 2012-05-07 DIAGNOSIS — R4182 Altered mental status, unspecified: Secondary | ICD-10-CM

## 2012-05-07 DIAGNOSIS — S060X1A Concussion with loss of consciousness of 30 minutes or less, initial encounter: Secondary | ICD-10-CM

## 2012-05-07 DIAGNOSIS — R531 Weakness: Secondary | ICD-10-CM

## 2012-05-07 LAB — BASIC METABOLIC PANEL
BUN: 29 mg/dL — ABNORMAL HIGH (ref 6–23)
Chloride: 104 mEq/L (ref 96–112)
Creatinine, Ser: 1.7 mg/dL — ABNORMAL HIGH (ref 0.4–1.5)
GFR: 39.83 mL/min — ABNORMAL LOW (ref >60.00)
Potassium: 4.1 mEq/L (ref 3.5–5.1)

## 2012-05-07 LAB — HEPATIC FUNCTION PANEL
Albumin: 3.3 g/dL — ABNORMAL LOW (ref 3.5–5.2)
Alkaline Phosphatase: 53 U/L (ref 39–117)
Total Protein: 6.4 g/dL (ref 6.0–8.3)

## 2012-05-07 LAB — POCT URINALYSIS DIPSTICK
Ketones, UA: NEGATIVE
Nitrite, UA: NEGATIVE
pH, UA: 5.5

## 2012-05-07 LAB — CBC WITH DIFFERENTIAL/PLATELET
Basophils Absolute: 0 10*3/uL (ref 0.0–0.1)
Basophils Relative: 0.5 % (ref 0.0–3.0)
Eosinophils Relative: 1.4 % (ref 0.0–5.0)
HCT: 41.3 % (ref 39.0–52.0)
Hemoglobin: 14 g/dL (ref 13.0–17.0)
Lymphocytes Relative: 25.6 % (ref 12.0–46.0)
Lymphs Abs: 1.7 10*3/uL (ref 0.7–4.0)
Monocytes Relative: 6.7 % (ref 3.0–12.0)
Neutro Abs: 4.4 10*3/uL (ref 1.4–7.7)
RBC: 4.37 Mil/uL (ref 4.22–5.81)
WBC: 6.8 10*3/uL (ref 4.5–10.5)

## 2012-05-07 LAB — TSH: TSH: 1.7 u[IU]/mL (ref 0.35–5.50)

## 2012-05-07 NOTE — Telephone Encounter (Signed)
[facility] Triage Call Report Triage Record Num: [id number] Operator: April Finney Patient Name: Thomas [name] Call Date & Time: 05/06/2012 3:23:44PM Patient Phone: [phone] PCP: Tera Mater. Clent Ridges Patient Gender: Male PCP Fax : [phone] Patient DOB: 03/09/1917 Practice Name: [facility] Reason for Call: Caller: [name]/Spouse; PCP: Gershon Crane [facility]); CB#: [phone]; Call regarding Constipation and having sob with ambulation and is weak. He has fallen twice recently since having his TURP procedure 2 weeks ago. Ruled out 911 symptoms. See in ED care advice given. Protocol(s) Used: Postoperative Problems Recommended Outcome per Protocol: See ED Immediately Reason for Outcome: Breathing problems Care Advice: ~ Another adult should drive. Call EMS 911 if sudden onset or sudden worsening of breathing problems, struggling to breathe, high pitched noise when breathing in (stridor), unable to speak, grasping at throat, or panic/anxiety because of breathing problems. ~ ~ IMMEDIATE ACTION Write down provider's name. List or place the following in a bag for transport with the patient: current prescription and/or nonprescription medications; alternative treatments, therapies and medications; and street drugs. ~ 02/23/

## 2012-05-07 NOTE — Progress Notes (Signed)
  Subjective:    Patient ID: Thomas [name], male    DOB: [birth year], 79 y.o.   MRN: [id number]  HPI Here with his daughter to assess weakness, mental status changes, and confusion. He had a TURP on 04-25-12 per Dr. [name] using epidural anesthesia, and this went very well. He had the catheter removed on 04-30-12 and he has been voiding normally since then. However he has been extremely weak, and apparently he feel in the bathroom the night of 04-30-12 and he says he struck the edge of the tub with his forehead. He was found by the family and EMS was called. They did not send him to the ER. Since then he has been very confused, and is incapable of doing anything without assistance, including dressing, bathing, going to the bathroom, etc. He is wearing Depends. He denies any HA or fever or SOB or nausea. He is drinking fluids.    Review of Systems  Constitutional: Positive for fatigue. Negative for fever.  Respiratory: Negative.   Cardiovascular: Negative.   Gastrointestinal: Negative.   Endocrine: Negative.   Neurological: Positive for dizziness, weakness, light-headedness and headaches. Negative for tremors, seizures and syncope.  All other systems reviewed and are negative.       Objective:   Physical Exam  Constitutional: He appears well-developed and well-nourished.  Alert, in a wheelchair   HENT:  Head: Normocephalic and atraumatic.  Neck: No thyromegaly present.  Cardiovascular: Normal rate, regular rhythm and intact distal pulses.   Has his usual 3/6 SM over the aortic area   Pulmonary/Chest: Effort normal and breath sounds normal.  Abdominal: Soft. Bowel sounds are normal. He exhibits no distension and no mass. There is no tenderness. There is no rebound and no guarding.  Lymphadenopathy:    He has no cervical adenopathy.  Neurological: He is alert.  Psychiatric: He has a normal mood and affect. His behavior is normal. Thought content normal.          Assessment & Plan:   He has declined rapidly since the night he fell, and we are concerned he has a concussion. We need to rule out a bleed or a stroke. Get labs and set up a brain MRI soon.

## 2012-05-08 ENCOUNTER — Emergency Department (HOSPITAL_COMMUNITY): Payer: Medicare Other

## 2012-05-08 ENCOUNTER — Ambulatory Visit (HOSPITAL_COMMUNITY): Payer: Medicare Other

## 2012-05-08 ENCOUNTER — Encounter (HOSPITAL_COMMUNITY): Payer: Self-pay | Admitting: *Deleted

## 2012-05-08 ENCOUNTER — Inpatient Hospital Stay (HOSPITAL_COMMUNITY)
Admission: EM | Admit: 2012-05-08 | Discharge: 2012-05-11 | DRG: 086 | Disposition: A | Discharge status: Skilled Nursing Facility | Payer: Medicare Other | Attending: Internal Medicine | Admitting: Internal Medicine

## 2012-05-08 ENCOUNTER — Telehealth: Payer: Self-pay | Admitting: Family Medicine

## 2012-05-08 ENCOUNTER — Inpatient Hospital Stay: Admission: RE | Admit: 2012-05-08 | Payer: Medicare Other | Source: Ambulatory Visit

## 2012-05-08 DIAGNOSIS — J449 Chronic obstructive pulmonary disease, unspecified: Secondary | ICD-10-CM

## 2012-05-08 DIAGNOSIS — R269 Unspecified abnormalities of gait and mobility: Secondary | ICD-10-CM | POA: Diagnosis present

## 2012-05-08 DIAGNOSIS — N138 Other obstructive and reflux uropathy: Secondary | ICD-10-CM | POA: Diagnosis present

## 2012-05-08 DIAGNOSIS — Z66 Do not resuscitate: Secondary | ICD-10-CM | POA: Diagnosis present

## 2012-05-08 DIAGNOSIS — I129 Hypertensive chronic kidney disease with stage 1 through stage 4 chronic kidney disease, or unspecified chronic kidney disease: Secondary | ICD-10-CM | POA: Diagnosis present

## 2012-05-08 DIAGNOSIS — G589 Mononeuropathy, unspecified: Secondary | ICD-10-CM | POA: Diagnosis present

## 2012-05-08 DIAGNOSIS — R413 Other amnesia: Secondary | ICD-10-CM | POA: Diagnosis present

## 2012-05-08 DIAGNOSIS — Z79899 Other long term (current) drug therapy: Secondary | ICD-10-CM

## 2012-05-08 DIAGNOSIS — N401 Enlarged prostate with lower urinary tract symptoms: Secondary | ICD-10-CM | POA: Diagnosis present

## 2012-05-08 DIAGNOSIS — R531 Weakness: Secondary | ICD-10-CM

## 2012-05-08 DIAGNOSIS — S065XAA Traumatic subdural hemorrhage with loss of consciousness status unknown, initial encounter: Principal | ICD-10-CM

## 2012-05-08 DIAGNOSIS — S065X9A Traumatic subdural hemorrhage with loss of consciousness of unspecified duration, initial encounter: Secondary | ICD-10-CM | POA: Diagnosis present

## 2012-05-08 DIAGNOSIS — J4489 Other specified chronic obstructive pulmonary disease: Secondary | ICD-10-CM | POA: Diagnosis present

## 2012-05-08 DIAGNOSIS — Z96659 Presence of unspecified artificial knee joint: Secondary | ICD-10-CM

## 2012-05-08 DIAGNOSIS — E538 Deficiency of other specified B group vitamins: Secondary | ICD-10-CM | POA: Diagnosis present

## 2012-05-08 DIAGNOSIS — N139 Obstructive and reflux uropathy, unspecified: Secondary | ICD-10-CM | POA: Diagnosis present

## 2012-05-08 DIAGNOSIS — N390 Urinary tract infection, site not specified: Secondary | ICD-10-CM

## 2012-05-08 DIAGNOSIS — W19XXXA Unspecified fall, initial encounter: Secondary | ICD-10-CM | POA: Diagnosis present

## 2012-05-08 DIAGNOSIS — R5381 Other malaise: Secondary | ICD-10-CM

## 2012-05-08 DIAGNOSIS — N289 Disorder of kidney and ureter, unspecified: Secondary | ICD-10-CM

## 2012-05-08 DIAGNOSIS — G9608 Other cranial cerebrospinal fluid leak: Secondary | ICD-10-CM

## 2012-05-08 DIAGNOSIS — N183 Chronic kidney disease, stage 3 unspecified: Secondary | ICD-10-CM | POA: Diagnosis present

## 2012-05-08 DIAGNOSIS — K219 Gastro-esophageal reflux disease without esophagitis: Secondary | ICD-10-CM | POA: Diagnosis present

## 2012-05-08 DIAGNOSIS — F0390 Unspecified dementia without behavioral disturbance: Secondary | ICD-10-CM | POA: Diagnosis present

## 2012-05-08 DIAGNOSIS — I2510 Atherosclerotic heart disease of native coronary artery without angina pectoris: Secondary | ICD-10-CM | POA: Diagnosis present

## 2012-05-08 DIAGNOSIS — E785 Hyperlipidemia, unspecified: Secondary | ICD-10-CM | POA: Diagnosis present

## 2012-05-08 DIAGNOSIS — I252 Old myocardial infarction: Secondary | ICD-10-CM

## 2012-05-08 DIAGNOSIS — I951 Orthostatic hypotension: Secondary | ICD-10-CM

## 2012-05-08 DIAGNOSIS — R32 Unspecified urinary incontinence: Secondary | ICD-10-CM

## 2012-05-08 DIAGNOSIS — R319 Hematuria, unspecified: Secondary | ICD-10-CM

## 2012-05-08 DIAGNOSIS — N320 Bladder-neck obstruction: Secondary | ICD-10-CM | POA: Diagnosis present

## 2012-05-08 DIAGNOSIS — B952 Enterococcus as the cause of diseases classified elsewhere: Secondary | ICD-10-CM | POA: Diagnosis present

## 2012-05-08 DIAGNOSIS — I10 Essential (primary) hypertension: Secondary | ICD-10-CM

## 2012-05-08 DIAGNOSIS — Z9181 History of falling: Secondary | ICD-10-CM

## 2012-05-08 HISTORY — DX: Other amnesia: R41.3

## 2012-05-08 LAB — CBC WITH DIFFERENTIAL/PLATELET
Basophils Relative: 1 % (ref 0–1)
Eosinophils Relative: 2 % (ref 0–5)
HCT: 40.7 % (ref 39.0–52.0)
Hemoglobin: 14 g/dL (ref 13.0–17.0)
MCH: 32.3 pg (ref 26.0–34.0)
MCHC: 34.4 g/dL (ref 30.0–36.0)
MCV: 93.8 fL (ref 78.0–100.0)
Monocytes Absolute: 0.5 10*3/uL (ref 0.1–1.0)
Monocytes Relative: 8 % (ref 3–12)
Neutro Abs: 4.6 10*3/uL (ref 1.7–7.7)
RDW: 12 % (ref 11.5–15.5)

## 2012-05-08 LAB — COMPREHENSIVE METABOLIC PANEL
Albumin: 3.2 g/dL — ABNORMAL LOW (ref 3.5–5.2)
BUN: 26 mg/dL — ABNORMAL HIGH (ref 6–23)
CO2: 27 mEq/L (ref 19–32)
Calcium: 8.9 mg/dL (ref 8.4–10.5)
Chloride: 102 mEq/L (ref 96–112)
Creatinine, Ser: 1.51 mg/dL — ABNORMAL HIGH (ref 0.50–1.35)
GFR calc non Af Amer: 38 mL/min — ABNORMAL LOW (ref >90)
Total Bilirubin: 0.3 mg/dL (ref 0.3–1.2)

## 2012-05-08 LAB — URINALYSIS, ROUTINE W REFLEX MICROSCOPIC
Protein, ur: 30 mg/dL — AB
Specific Gravity, Urine: 1.008 (ref 1.005–1.030)
Urobilinogen, UA: 0.2 mg/dL (ref 0.0–1.0)

## 2012-05-08 LAB — TROPONIN I: Troponin I: <0.3 ng/mL (ref <0.30)

## 2012-05-08 MED ORDER — MOMETASONE FURO-FORMOTEROL FUM 100-5 MCG/ACT IN AERO
2.0000 | INHALATION_SPRAY | Freq: Two times a day (BID) | RESPIRATORY_TRACT | Status: DC
  Administered 2012-05-08 – 2012-05-11 (×6): 2 via RESPIRATORY_TRACT
  Filled 2012-05-08 (×2): qty 8.8

## 2012-05-08 MED ORDER — DILTIAZEM HCL ER 180 MG PO CP24
180.0000 mg | ORAL_CAPSULE | Freq: Every morning | ORAL | Status: DC
  Administered 2012-05-09 – 2012-05-11 (×3): 180 mg via ORAL
  Filled 2012-05-08 (×3): qty 1

## 2012-05-08 MED ORDER — ADULT MULTIVITAMIN W/MINERALS CH
1.0000 | ORAL_TABLET | Freq: Every day | ORAL | Status: DC
  Administered 2012-05-08 – 2012-05-11 (×4): 1 via ORAL
  Filled 2012-05-08 (×4): qty 1

## 2012-05-08 MED ORDER — TRANDOLAPRIL 4 MG PO TABS
4.0000 mg | ORAL_TABLET | Freq: Every day | ORAL | Status: DC
  Administered 2012-05-08 – 2012-05-11 (×4): 4 mg via ORAL
  Filled 2012-05-08 (×4): qty 1

## 2012-05-08 MED ORDER — LATANOPROST 0.005 % OP SOLN
1.0000 [drp] | Freq: Every day | OPHTHALMIC | Status: DC
  Administered 2012-05-08 – 2012-05-10 (×3): 1 [drp] via OPHTHALMIC
  Filled 2012-05-08 (×3): qty 2.5

## 2012-05-08 MED ORDER — SODIUM CHLORIDE 0.9 % IV SOLN: 250.0000 mL | INTRAVENOUS | Status: DC | PRN

## 2012-05-08 MED ORDER — ACETAMINOPHEN 325 MG PO TABS: 650.0000 mg | ORAL_TABLET | Freq: Four times a day (QID) | ORAL | Status: DC | PRN

## 2012-05-08 MED ORDER — SODIUM CHLORIDE 0.9 % IJ SOLN
3.0000 mL | INTRAMUSCULAR | Status: DC | PRN
  Administered 2012-05-10: 3 mL via INTRAVENOUS

## 2012-05-08 MED ORDER — DEXTROSE 5 % IV SOLN
1.0000 g | INTRAVENOUS | Status: DC
  Administered 2012-05-09 – 2012-05-10 (×2): 1 g via INTRAVENOUS
  Filled 2012-05-08 (×5): qty 10

## 2012-05-08 MED ORDER — ZOLPIDEM TARTRATE 5 MG PO TABS
5.0000 mg | ORAL_TABLET | Freq: Every evening | ORAL | Status: DC | PRN
  Administered 2012-05-08 – 2012-05-10 (×3): 5 mg via ORAL
  Filled 2012-05-08 (×3): qty 1

## 2012-05-08 MED ORDER — SENNA-DOCUSATE SODIUM 8.6-50 MG PO TABS: 1.0000 | ORAL_TABLET | Freq: Two times a day (BID) | ORAL | Status: DC

## 2012-05-08 MED ORDER — ACETAMINOPHEN 650 MG RE SUPP: 650.0000 mg | Freq: Four times a day (QID) | RECTAL | Status: DC | PRN

## 2012-05-08 MED ORDER — TEMAZEPAM 15 MG PO CAPS: 30.0000 mg | ORAL_CAPSULE | Freq: Every evening | ORAL | Status: DC | PRN

## 2012-05-08 MED ORDER — SODIUM CHLORIDE 0.9 % IJ SOLN
3.0000 mL | Freq: Two times a day (BID) | INTRAMUSCULAR | Status: DC
  Administered 2012-05-08 – 2012-05-10 (×4): 3 mL via INTRAVENOUS

## 2012-05-08 MED ORDER — SENNOSIDES-DOCUSATE SODIUM 8.6-50 MG PO TABS
1.0000 | ORAL_TABLET | Freq: Two times a day (BID) | ORAL | Status: DC
  Administered 2012-05-08 – 2012-05-10 (×5): 1 via ORAL
  Filled 2012-05-08 (×8): qty 1

## 2012-05-08 NOTE — H&P (Addendum)
[facility] History and Physical  Thomas [name] FAO:[id number] DOB: 1917/01/19 DOA: 05/08/2012  Referring physician: Dr Dara Lords. PCP: Nelwyn Salisbury, MD  Specialists: Dr Pennie Banter, neurosurgery.  Chief Complaint: Fall, weakness.  HPI: Thomas [name] is a 79 y.o. male with PMH significant for BPH, s/p TURP 04-25-2012, CAD on aspirin presents with weakness, inability to ambulate, progressive confusion that started after a fall 1 week prior to admission. History was obtain from daughter who was at bedside. Patient able to provide some history. Per daughter Thomas [name] was able to do daily living activities, he was able to ambulate with a walker. Ever since the fall 1 week ago he has not been able to ambulate. He has increase memory problems, confusion. He was taking some antibiotics after surgery but he finished. He has some loose stool, specially after he takes his laxative. No headaches. Patient relates dizziness when he bend his head.  Review of Systems: The patient denies anorexia, fever, weight loss,, vision loss, decreased hearing, hoarseness, chest pain, syncope, dyspnea on exertion, peripheral edema,hemoptysis, abdominal pain, melena, hematochezia, severe indigestion/heartburn, hematuria, incontinence, suspicious skin lesions, transient blindness, difficulty walking, depression, unusual weight change.  Past Medical History  Diagnosis Date  . Mild aortic stenosis     last echo 08/2006  . Coronary artery disease     s/p PCI in 1995; Cath 2005 showed moderate 2 vessel CAD involving the distal Left main & LCx/OM, treated medically  . Myocardial infarction   . Hyperlipidemia   . Hypertension   . Vitamin B12 deficiency   . Neuropathy   . Allergic rhinitis   . OA (osteoarthritis)   . History of bladder cancer   . GERD (gastroesophageal reflux disease)   . Esophageal stricture   . Chronic kidney disease     over active bladder  . BPH (benign prostatic hypertrophy)     URINARY  RETENTION-HAS INDWELLING FOLEY CATHETER  . Memory loss     short term memory loss which pt denies   Past Surgical History  Procedure Laterality Date  . Coronary angioplasty    . Appendectomy    . Cholecystectomy    . Tonsillectomy    . Sigmoidoscopy  1999  . Colonoscopy    . Bilateral elbow surgery    . Left knee replacement    . Carotid endarterectomy  08/2008  . Esophagogastroduodenoscopy      dilation  . Transurethral resection of prostate N/A 04/25/2012    Procedure: TRANSURETHRAL RESECTION OF THE PROSTATE WITH GYRUS INSTRUMENTS;  Surgeon: Sebastian Ache, MD;  Location: [facility];  Service: Urology;  Laterality: N/A;  . Cystoscopy with biopsy N/A 04/25/2012    Procedure: CYSTOSCOPY WITH BIOPSY;  Surgeon: Sebastian Ache, MD;  Location: [facility];  Service: Urology;  Laterality: N/A;   Social History:  reports that he quit smoking about 62 years ago. He has never used smokeless tobacco. He reports that he does not drink alcohol or use illicit drugs.   Allergies  Allergen Reactions  . Sulfamethoxazole     REACTION: unspecified    Family History  Problem Relation Age of Onset  . Coronary artery disease Brother   . Prostate cancer      first degree relative    Prior to Admission medications   Medication Sig Start Date End Date Taking? Authorizing Provider  aspirin EC 81 MG tablet Take 81 mg by mouth every morning.    Yes Historical Provider, MD  diltiazem (DILACOR XR) 180 MG 24 hr  capsule Take 180 mg by mouth every morning.   Yes Historical Provider, MD  fluticasone (FLONASE) 50 MCG/ACT nasal spray Place 2 sprays into the nose daily. 03/22/12 03/22/13 Yes Nelwyn Salisbury, MD  latanoprost (XALATAN) 0.005 % ophthalmic solution Place 1 drop into both eyes at bedtime.   Yes Historical Provider, MD  mometasone-formoterol (DULERA) 100-5 MCG/ACT AERO Inhale 2 puffs into the lungs 2 (two) times daily. 12/23/11  Yes Nelwyn Salisbury, MD  Multiple Vitamin (MULTIVITAMIN WITH MINERALS) TABS Take 1  tablet by mouth daily.   Yes Historical Provider, MD  nitroGLYCERIN (NITROSTAT) 0.4 MG SL tablet Place 0.4 mg under the tongue every 5 (five) minutes as needed. For chest pain   Yes Historical Provider, MD  sennosides-docusate sodium (SENOKOT-S) 8.6-50 MG tablet Take 1 tablet by mouth 2 (two) times daily. While taking pain meds to prevent constipation. 04/26/12  Yes Sebastian Ache, MD  temazepam (RESTORIL) 30 MG capsule Take 1 capsule (30 mg total) by mouth at bedtime as needed for sleep. 04/03/12  Yes Nelwyn Salisbury, MD  traMADol (ULTRAM) 50 MG tablet Take 1 tablet (50 mg total) by mouth every 6 (six) hours as needed. 04/26/12  Yes Sebastian Ache, MD  trandolapril (MAVIK) 4 MG tablet Take 1 tablet (4 mg total) by mouth daily. 04/24/12  Yes Nelwyn Salisbury, MD   Physical Exam: Filed Vitals:   05/08/12 1226  BP: 159/71  Pulse: 71  Temp: 97.5 F (36.4 C)  TempSrc: Oral  Resp: 18  SpO2: 95%     General:  Awake, following command in no acute distress.  Eyes: PERLA.  ENT: No tonsillar enlargement.  Neck:Supple, no thyromegaly.   Cardiovascular: S 1, S 2 RRR  Respiratory: CTA  Abdomen: Bs present, soft, NT, ND  Skin: multiple bruises.   Musculoskeletal:no deformity  Neurologic: Awake, oriented to place, person, cranial nerve grossly intact. Normal motor strength upper extremities, lower extremities 5/5, rigidity.   Labs on Admission:  Basic Metabolic Panel:  Recent Labs Lab 05/07/12 1500 05/08/12 1248  NA 141 138  K 4.1 4.1  CL 104 102  CO2 29 27  GLUCOSE 108* 106*  BUN 29* 26*  CREATININE 1.7* 1.51*  CALCIUM 8.9 8.9   Liver Function Tests:  Recent Labs Lab 05/07/12 1500 05/08/12 1248  AST 20 19  ALT 17 15  ALKPHOS 53 58  BILITOT 0.4 0.3  PROT 6.4 6.4  ALBUMIN 3.3* 3.2*   No results found for this basename: LIPASE, AMYLASE,  in the last 168 hours No results found for this basename: AMMONIA,  in the last 168 hours CBC:  Recent Labs Lab 05/07/12 1500  05/08/12 1248  WBC 6.8 6.8  NEUTROABS 4.4 4.6  HGB 14.0 14.0  HCT 41.3 40.7  MCV 94.7 93.8  PLT 206.0 189   Cardiac Enzymes:  Recent Labs Lab 05/08/12 1248  TROPONINI <0.30    BNP (last 3 results) No results found for this basename: PROBNP,  in the last 8760 hours CBG: No results found for this basename: GLUCAP,  in the last 168 hours  Radiological Exams on Admission: Ct Head Wo Contrast  05/08/2012  *RADIOLOGY REPORT*  Clinical Data:  Multiple falls.  Frontal trauma.  Increased dizziness and confusion.  Weakness.  Unable to move legs today.  CT HEAD WITHOUT CONTRAST CT MAXILLOFACIAL WITHOUT CONTRAST  Technique:  Multidetector CT imaging of the head and maxillofacial structures were performed using the standard protocol without intravenous contrast. Multiplanar CT image reconstructions of  the maxillofacial structures were also generated.  Comparison:  Report of MRI 10/31/2002 at [facility].  The images are no longer available.  CT HEAD  Findings: A mixed density left frontal extra-axial collection is present.  This measures approximate 6 mm.  Left to right midline shift measures 2 mm at the foramen of Monro.  Advanced atrophy and white matter disease is present.  No acute cortical infarct or parenchymal hemorrhages present.  The ventricles are portion to the degree of atrophy.  The paranasal sinuses and mastoid air cells are clear.  The osseous skull is intact.  No significant extracranial soft tissue injury is present.  Atherosclerotic changes are noted.  IMPRESSION:  1.  Left frontal extra-axial mixed density collection is most compatible with a subdural hematoma.  This appears at least several days old. 2.  Mild mass effect despite atrophy with 2 mm midline shift. 3.  Advanced atrophy and white matter disease.  This likely reflects the sequelae of chronic microvascular ischemia.  CT MAXILLOFACIAL  Findings:   The paranasal sinuses are clear.  No significant focal soft tissue  injury is present.  There is no acute fracture.  The mandible is intact and located.  The patient is nearly edentulous a single right lateral incisor or canine tooth remains.  Degenerative changes are present in the upper cervical spine.  The subdural hematoma is better seen on the head CT.  Atherosclerotic calcifications at the carotid bifurcations are worse on the right.  IMPRESSION:  1.  No acute facial fracture. 2.  Left subdural hematoma. 3.  Atherosclerosis.  Critical Value/emergent results were called by telephone at the time of interpretation on 05/08/2012 at 02:25 p.m. to Dr. Devoria Albe, who verbally acknowledged these results.   Original Report Authenticated By: Marin Roberts, M.D.    Dg Abd Acute W/chest  05/08/2012  *RADIOLOGY REPORT*  Clinical Data: Weakness, constipation, fall and history of bladder cancer.  ACUTE ABDOMEN SERIES (ABDOMEN 2 VIEW & CHEST 1 VIEW)  Comparison: Chest x-ray on 04/20/2012 and abdominal CT on 02/16/2012.  Findings: Lungs show stable chronic disease and mild atelectasis at the left base.  No focal consolidation or edema is identified. Heart size is within normal limits.  There is stable ectasia of the thoracic aorta.  Abdominal films demonstrate no evidence of acute bowel obstruction, significant ileus or free intraperitoneal air.  No abnormal calcifications.  Diffuse degenerative changes are present of the lumbar spine.  IMPRESSION: No acute findings.   Original Report Authenticated By: Irish Lack, M.D.    Ct Maxillofacial Wo Cm  05/08/2012  *RADIOLOGY REPORT*  Clinical Data:  Multiple falls.  Frontal trauma.  Increased dizziness and confusion.  Weakness.  Unable to move legs today.  CT HEAD WITHOUT CONTRAST CT MAXILLOFACIAL WITHOUT CONTRAST  Technique:  Multidetector CT imaging of the head and maxillofacial structures were performed using the standard protocol without intravenous contrast. Multiplanar CT image reconstructions of the maxillofacial structures were  also generated.  Comparison:  Report of MRI 10/31/2002 at [facility].  The images are no longer available.  CT HEAD  Findings: A mixed density left frontal extra-axial collection is present.  This measures approximate 6 mm.  Left to right midline shift measures 2 mm at the foramen of Monro.  Advanced atrophy and white matter disease is present.  No acute cortical infarct or parenchymal hemorrhages present.  The ventricles are portion to the degree of atrophy.  The paranasal sinuses and mastoid air cells are clear.  The osseous skull is intact.  No significant extracranial soft tissue injury is present.  Atherosclerotic changes are noted.  IMPRESSION:  1.  Left frontal extra-axial mixed density collection is most compatible with a subdural hematoma.  This appears at least several days old. 2.  Mild mass effect despite atrophy with 2 mm midline shift. 3.  Advanced atrophy and white matter disease.  This likely reflects the sequelae of chronic microvascular ischemia.  CT MAXILLOFACIAL  Findings:   The paranasal sinuses are clear.  No significant focal soft tissue injury is present.  There is no acute fracture.  The mandible is intact and located.  The patient is nearly edentulous a single right lateral incisor or canine tooth remains.  Degenerative changes are present in the upper cervical spine.  The subdural hematoma is better seen on the head CT.  Atherosclerotic calcifications at the carotid bifurcations are worse on the right.  IMPRESSION:  1.  No acute facial fracture. 2.  Left subdural hematoma. 3.  Atherosclerosis.  Critical Value/emergent results were called by telephone at the time of interpretation on 05/08/2012 at 02:25 p.m. to Dr. Devoria Albe, who verbally acknowledged these results.   Original Report Authenticated By: Marin Roberts, M.D.    EKG sinus   Assessment/Plan Active Problems:   * No active hospital problems. *  1-Subdural Hematoma: Per Dr Dara Lords, she discussed care with Dr  Baxter Hire who think patient has an hygroma. Patient presents after a fall, CT show subdural hematoma, mild mass effect, 2 mm shift. Admit to step down unit. I requested  a formal consultation and evaluation from Dr Gerlene Fee. Family aware of plan. I will hold aspirin. No DVT prophylaxis due to subdural hematoma.  2-UTI: Ua with 11-20 WBC. I will start ceftriaxone.  3-GI: monitor for diarrhea: If diarrhea will consider C diff.  4-CKD stage 3: Cr appears at baseline. 1.5 5-HTN: continue with Cardizem.  6-Weakness, declining functioning activities: Probably related to subdural. Neurosurgery evaluation.    Code Status: DNR/DNI, discussed with daughter and patient.  Family Communication: daughter at bedside, care discussed with her.  Disposition Plan: expect 4 to 5 days inpatient.  Time spent: 70 minutes coordinating care, Air traffic controller.   Gray Doering [facility] Pager [phone]  If 7PM-7AM, please contact night-coverage [email or web address] Password [facility] 05/08/2012, 4:40 PM

## 2012-05-08 NOTE — ED Provider Notes (Signed)
History     CSN: [id number]  Arrival date & time 05/08/12  1214   First MD Initiated Contact with Patient 05/08/12 1217      Chief Complaint  Patient presents with  . Weakness    (Consider location/radiation/quality/duration/timing/severity/associated sxs/prior treatment) HPI  Patient here with his housekeeper who has worked for him for the past 2 months. She reports patient had a urological procedure done on February 12. Patient states he had a TURP done. She states since then he seems to beginning a week or. She states a week ago he was sitting on the toilet and leaned forward and he fell forward hitting his head on the track that the shower door runs on. He denies loss of consciousness however his wife reported to the housekeeper she couldn't get him to wake up. Evidently EMS came the house and checked him and put  him in bed and he did not get medical care. She reports since then he seems to be having some memory problems and confusion and getting more agitated. She states he's supposed to get help when he walks  to the bathroom however he states he went to the bathroom today on his own and did not fall. He has fallen 3 more times since then the last time was 2 days ago when he was using his walker and he slipped and fell. He denies headache, nausea, vomiting, or blurred vision or diarrhea. He states he is constipated which is chronic and his last bowel movement was today. He takes laxatives daily. He did have a Foley catheter after his surgery but now he is voiding on his own. His housekeeper states she was only working 3 hours a day, now she staying 7-8 hours a day. She states the patient is having urinary and fecal incontinence. He also has been very weak and hard to get up and down and he also has been having some confusion.  PCP Dr Clent Ridges  Past Medical History  Diagnosis Date  . Mild aortic stenosis     last echo 08/2006  . Coronary artery disease     s/p PCI in 1995; Cath 2005 showed  moderate 2 vessel CAD involving the distal Left main & LCx/OM, treated medically  . Myocardial infarction   . Hyperlipidemia   . Hypertension   . Vitamin B12 deficiency   . Neuropathy   . Allergic rhinitis   . OA (osteoarthritis)   . History of bladder cancer   . GERD (gastroesophageal reflux disease)   . Esophageal stricture   . Chronic kidney disease     over active bladder  . BPH (benign prostatic hypertrophy)     URINARY RETENTION-HAS INDWELLING FOLEY CATHETER    Past Surgical History  Procedure Laterality Date  . Coronary angioplasty    . Appendectomy    . Cholecystectomy    . Tonsillectomy    . Sigmoidoscopy  1999  . Colonoscopy    . Bilateral elbow surgery    . Left knee replacement    . Carotid endarterectomy  08/2008  . Esophagogastroduodenoscopy      dilation  . Transurethral resection of prostate N/A 04/25/2012    Procedure: TRANSURETHRAL RESECTION OF THE PROSTATE WITH GYRUS INSTRUMENTS;  Surgeon: Sebastian Ache, MD;  Location: [facility];  Service: Urology;  Laterality: N/A;  . Cystoscopy with biopsy N/A 04/25/2012    Procedure: CYSTOSCOPY WITH BIOPSY;  Surgeon: Sebastian Ache, MD;  Location: [facility];  Service: Urology;  Laterality: N/A;  Family History  Problem Relation Age of Onset  . Coronary artery disease Brother   . Prostate cancer      first degree relative    History  Substance Use Topics  . Smoking status: Former Smoker -- 1.00 packs/day for 10 years    Quit date: 03/14/1950  . Smokeless tobacco: Never Used  . Alcohol Use: No   Lives at home Lives with spouse walker  Review of Systems  All other systems reviewed and are negative.    Allergies  Sulfamethoxazole  Home Medications   Current Outpatient Rx  Name  Route  Sig  Dispense  Refill  . aspirin EC 81 MG tablet   Oral   Take 81 mg by mouth every morning.          . diltiazem (DILACOR XR) 180 MG 24 hr capsule   Oral   Take 180 mg by mouth every morning.         .  fluticasone (FLONASE) 50 MCG/ACT nasal spray   Nasal   Place 2 sprays into the nose daily.   16 g   11   . latanoprost (XALATAN) 0.005 % ophthalmic solution   Both Eyes   Place 1 drop into both eyes at bedtime.         . mometasone-formoterol (DULERA) 100-5 MCG/ACT AERO   Inhalation   Inhale 2 puffs into the lungs 2 (two) times daily.   1 Inhaler   11   . Multiple Vitamin (MULTIVITAMIN WITH MINERALS) TABS   Oral   Take 1 tablet by mouth daily.         . nitroGLYCERIN (NITROSTAT) 0.4 MG SL tablet   Sublingual   Place 0.4 mg under the tongue every 5 (five) minutes as needed. For chest pain         . sennosides-docusate sodium (SENOKOT-S) 8.6-50 MG tablet   Oral   Take 1 tablet by mouth 2 (two) times daily. While taking pain meds to prevent constipation.   30 tablet   0   . temazepam (RESTORIL) 30 MG capsule   Oral   Take 1 capsule (30 mg total) by mouth at bedtime as needed for sleep.   30 capsule   5   . traMADol (ULTRAM) 50 MG tablet   Oral   Take 1 tablet (50 mg total) by mouth every 6 (six) hours as needed.   30 tablet   1   . trandolapril (MAVIK) 4 MG tablet   Oral   Take 1 tablet (4 mg total) by mouth daily.   30 tablet   3     BP 159/71  Pulse 71  Temp(Src) 97.5 F (36.4 C) (Oral)  Resp 18  SpO2 95%  Vital signs normal    Physical Exam  Nursing note and vitals reviewed. Constitutional: He is oriented to person, place, and time. He appears well-developed and well-nourished.  Non-toxic appearance. He does not appear ill. No distress.  HENT:  Head: Normocephalic and atraumatic.  Right Ear: External ear normal.  Left Ear: External ear normal.  Nose: Nose normal. No mucosal edema or rhinorrhea.  Mouth/Throat: Oropharynx is clear and moist and mucous membranes are normal. No dental abscesses or edematous.  Eyes: Conjunctivae and EOM are normal. Pupils are equal, round, and reactive to light.  Neck: Normal range of motion and full passive  range of motion without pain. Neck supple.  Cardiovascular: Normal rate, regular rhythm and normal heart sounds.  Exam reveals no gallop  and no friction rub.   No murmur heard. Pulmonary/Chest: Effort normal and breath sounds normal. No respiratory distress. He has no wheezes. He has no rhonchi. He has no rales. He exhibits no tenderness and no crepitus.  Abdominal: Soft. Normal appearance and bowel sounds are normal. He exhibits no distension. There is no tenderness. There is no rebound and no guarding.  Musculoskeletal: Normal range of motion. He exhibits no edema and no tenderness.  Moves all extremities well.   Neurological: He is alert and oriented to person, place, and time. He has normal strength. No cranial nerve deficit.  Skin: Skin is warm, dry and intact. No rash noted. No erythema. No pallor.  Psychiatric: He has a normal mood and affect. His speech is normal and behavior is normal. His mood appears not anxious.    ED Course  Procedures (including critical care time)  Medications - No data to display   14:28 Dr Alfredo Batty, Radiology called head CT results.   14:46 Dr Gerlene Fee has reviewed the CT scan and feels this is a hygroma and feels it is not the cause of his current change in MS, states this is not a surgical problem and to have medicine admit.  15:38 Dr Sunnie Nielsen will admit   Results for orders placed during the hospital encounter of 05/08/12  CBC WITH DIFFERENTIAL      Result Value Range   WBC 6.8  4.0 - 10.5 K/uL   RBC 4.34  4.22 - 5.81 MIL/uL   Hemoglobin 14.0  13.0 - 17.0 g/dL   HCT 40.9  81.1 - 91.4 %   MCV 93.8  78.0 - 100.0 fL   MCH 32.3  26.0 - 34.0 pg   MCHC 34.4  30.0 - 36.0 g/dL   RDW 78.2  95.6 - 21.3 %   Platelets 189  150 - 400 K/uL   Neutrophils Relative 68  43 - 77 %   Neutro Abs 4.6  1.7 - 7.7 K/uL   Lymphocytes Relative 23  12 - 46 %   Lymphs Abs 1.5  0.7 - 4.0 K/uL   Monocytes Relative 8  3 - 12 %   Monocytes Absolute 0.5  0.1 - 1.0 K/uL    Eosinophils Relative 2  0 - 5 %   Eosinophils Absolute 0.1  0.0 - 0.7 K/uL   Basophils Relative 1  0 - 1 %   Basophils Absolute 0.0  0.0 - 0.1 K/uL  COMPREHENSIVE METABOLIC PANEL      Result Value Range   Sodium 138  135 - 145 mEq/L   Potassium 4.1  3.5 - 5.1 mEq/L   Chloride 102  96 - 112 mEq/L   CO2 27  19 - 32 mEq/L   Glucose, Bld 106 (*) 70 - 99 mg/dL   BUN 26 (*) 6 - 23 mg/dL   Creatinine, Ser 0.86 (*) 0.50 - 1.35 mg/dL   Calcium 8.9  8.4 - 57.8 mg/dL   Total Protein 6.4  6.0 - 8.3 g/dL   Albumin 3.2 (*) 3.5 - 5.2 g/dL   AST 19  0 - 37 U/L   ALT 15  0 - 53 U/L   Alkaline Phosphatase 58  39 - 117 U/L   Total Bilirubin 0.3  0.3 - 1.2 mg/dL   GFR calc non Af Amer 38 (*) >90 mL/min   GFR calc Af Amer 44 (*) >90 mL/min  URINALYSIS, ROUTINE W REFLEX MICROSCOPIC      Result Value Range  Color, Urine YELLOW  YELLOW   APPearance CLOUDY (*) CLEAR   Specific Gravity, Urine 1.008  1.005 - 1.030   pH 7.0  5.0 - 8.0   Glucose, UA NEGATIVE  NEGATIVE mg/dL   Hgb urine dipstick LARGE (*) NEGATIVE   Bilirubin Urine NEGATIVE  NEGATIVE   Ketones, ur NEGATIVE  NEGATIVE mg/dL   Protein, ur 30 (*) NEGATIVE mg/dL   Urobilinogen, UA 0.2  0.0 - 1.0 mg/dL   Nitrite NEGATIVE  NEGATIVE   Leukocytes, UA MODERATE (*) NEGATIVE  TROPONIN I      Result Value Range   Troponin I <0.30  <0.30 ng/mL  URINE MICROSCOPIC-ADD ON      Result Value Range   Squamous Epithelial / LPF FEW (*) RARE   WBC, UA 11-20  <3 WBC/hpf   RBC / HPF TOO NUMEROUS TO COUNT  <3 RBC/hpf   Bacteria, UA FEW (*) RARE   Urine-Other MUCOUS PRESENT     Laboratory interpretation all normal except renal insuffic   Dg Abd Acute W/chest  05/08/2012  *RADIOLOGY REPORT*  Clinical Data: Weakness, constipation, fall and history of bladder cancer.  ACUTE ABDOMEN SERIES (ABDOMEN 2 VIEW & CHEST 1 VIEW)  Comparison: Chest x-ray on 04/20/2012 and abdominal CT on 02/16/2012.  Findings: Lungs show stable chronic disease and mild atelectasis at  the left base.  No focal consolidation or edema is identified. Heart size is within normal limits.  There is stable ectasia of the thoracic aorta.  Abdominal films demonstrate no evidence of acute bowel obstruction, significant ileus or free intraperitoneal air.  No abnormal calcifications.  Diffuse degenerative changes are present of the lumbar spine.  IMPRESSION: No acute findings.   Original Report Authenticated By: Irish Lack, M.D.    Ct Head Wo Contrast  Ct Maxillofacial Wo Cm  05/08/2012  *RADIOLOGY REPORT*  Clinical Data:  Multiple falls.  Frontal trauma.  Increased dizziness and confusion.  Weakness.  Unable to move legs today.  CT HEAD WITHOUT CONTRAST CT MAXILLOFACIAL WITHOUT CONTRAST  Technique:  Multidetector CT imaging of the head and maxillofacial structures were performed using the standard protocol without intravenous contrast. Multiplanar CT image reconstructions of the maxillofacial structures were also generated.  Comparison:  Report of MRI 10/31/2002 at [facility].  The images are no longer available.  CT HEAD  Findings: A mixed density left frontal extra-axial collection is present.  This measures approximate 6 mm.  Left to right midline shift measures 2 mm at the foramen of Monro.  Advanced atrophy and white matter disease is present.  No acute cortical infarct or parenchymal hemorrhages present.  The ventricles are portion to the degree of atrophy.  The paranasal sinuses and mastoid air cells are clear.  The osseous skull is intact.  No significant extracranial soft tissue injury is present.  Atherosclerotic changes are noted.  IMPRESSION:  1.  Left frontal extra-axial mixed density collection is most compatible with a subdural hematoma.  This appears at least several days old. 2.  Mild mass effect despite atrophy with 2 mm midline shift. 3.  Advanced atrophy and white matter disease.  This likely reflects the sequelae of chronic microvascular ischemia.  CT MAXILLOFACIAL   Findings:   The paranasal sinuses are clear.  No significant focal soft tissue injury is present.  There is no acute fracture.  The mandible is intact and located.  The patient is nearly edentulous a single right lateral incisor or canine tooth remains.  Degenerative changes are  present in the upper cervical spine.  The subdural hematoma is better seen on the head CT.  Atherosclerotic calcifications at the carotid bifurcations are worse on the right.  IMPRESSION:  1.  No acute facial fracture. 2.  Left subdural hematoma. 3.  Atherosclerosis.  Critical Value/emergent results were called by telephone at the time of interpretation on 05/08/2012 at 02:25 p.m. to Dr. Devoria Albe, who verbally acknowledged these results.   Original Report Authenticated By: Marin Roberts, M.D.      Dg Chest 2 View  04/20/2012 CXR   IMPRESSION: COPD. No acute abnormalities.   Original Report Authenticated By: Ulyses Southward, M.D.       Date: 05/08/2012  Rate: 63  Rhythm: normal sinus rhythm  QRS Axis: normal  Intervals: normal  ST/T Wave abnormalities: normal  Conduction Disutrbances:none  Narrative Interpretation:   Old EKG Reviewed: unchanged from 01/02/2012    1. Subdural hygroma   2. Urinary incontinence   3. Weakness   4. Hematuria   5. Renal insufficiency    Plan admission   Devoria Albe, MD, FACEP    MDM          Ward Givens, MD 05/08/12 [phone]

## 2012-05-08 NOTE — Progress Notes (Signed)
Quick Note:  I spoke with pt's wife and gave results. ______

## 2012-05-08 NOTE — ED Notes (Signed)
Per EMS: pt coming from home with c/o weakness and falls x 3 weeks, per EMS pt fell week and a half ago and hit his head. Per EMS pt seen by his PCP yesterday for blood work and was told to come to ED.

## 2012-05-08 NOTE — Telephone Encounter (Signed)
[facility] called to cancel MRI. Pt going to ED. Encounter closed.

## 2012-05-08 NOTE — ED Notes (Signed)
[id number]<AV> Expected date:<BR> Expected time:<BR> Means of arrival:<BR> Comments:<BR> [id number]-EMS

## 2012-05-08 NOTE — Consult Note (Signed)
Reason for Consult:Falls Referring Physician: Hospitalists  Thomas [name] is an 79 y.o. male.  HPI: 79 year old male who has had chronic difficulty walking over the last 6-12 months!!! He has a turp about a month ago, and since that time, he has been using a walker all the time and barely ambulates. He also carried a diagnosis of neuropathy that makes walking difficult. Recently, he reportedly fell a few times trying to ambulate. He was brought to the ED where CT brain was obtained that showed a TINY extra axial fluid collection with no acute component and 1-2 mm of shift, and marked brain atrophy. Called by ED MD, and I informed her that this fluid collection was not clinically relevant, but admitting service requested a consult anyway.   Past Medical History  Diagnosis Date  . Mild aortic stenosis     last echo 08/2006  . Coronary artery disease     s/p PCI in 1995; Cath 2005 showed moderate 2 vessel CAD involving the distal Left main & LCx/OM, treated medically  . Myocardial infarction   . Hyperlipidemia   . Hypertension   . Vitamin B12 deficiency   . Neuropathy   . Allergic rhinitis   . OA (osteoarthritis)   . History of bladder cancer   . GERD (gastroesophageal reflux disease)   . Esophageal stricture   . Chronic kidney disease     over active bladder  . BPH (benign prostatic hypertrophy)     URINARY RETENTION-HAS INDWELLING FOLEY CATHETER  . Memory loss     short term memory loss which pt denies    Past Surgical History  Procedure Laterality Date  . Coronary angioplasty    . Appendectomy    . Cholecystectomy    . Tonsillectomy    . Sigmoidoscopy  1999  . Colonoscopy    . Bilateral elbow surgery    . Left knee replacement    . Carotid endarterectomy  08/2008  . Esophagogastroduodenoscopy      dilation  . Transurethral resection of prostate N/A 04/25/2012    Procedure: TRANSURETHRAL RESECTION OF THE PROSTATE WITH GYRUS INSTRUMENTS;  Surgeon: Sebastian Ache, MD;   Location: [facility];  Service: Urology;  Laterality: N/A;  . Cystoscopy with biopsy N/A 04/25/2012    Procedure: CYSTOSCOPY WITH BIOPSY;  Surgeon: Sebastian Ache, MD;  Location: [facility];  Service: Urology;  Laterality: N/A;    Family History  Problem Relation Age of Onset  . Coronary artery disease Brother   . Prostate cancer      first degree relative    Social History:  reports that he quit smoking about 62 years ago. He has never used smokeless tobacco. He reports that he does not drink alcohol or use illicit drugs.  Allergies:  Allergies  Allergen Reactions  . Sulfamethoxazole     REACTION: unspecified    Medications: I have reviewed the patient's current medications.  Results for orders placed during the hospital encounter of 05/08/12 (from the past 48 hour(s))  CBC WITH DIFFERENTIAL     Status: None   Collection Time    05/08/12 12:48 PM      Result Value Range   WBC 6.8  4.0 - 10.5 K/uL   RBC 4.34  4.22 - 5.81 MIL/uL   Hemoglobin 14.0  13.0 - 17.0 g/dL   HCT 16.1  09.6 - 04.5 %   MCV 93.8  78.0 - 100.0 fL   MCH 32.3  26.0 - 34.0 pg  MCHC 34.4  30.0 - 36.0 g/dL   RDW 45.4  09.8 - 11.9 %   Platelets 189  150 - 400 K/uL   Neutrophils Relative 68  43 - 77 %   Neutro Abs 4.6  1.7 - 7.7 K/uL   Lymphocytes Relative 23  12 - 46 %   Lymphs Abs 1.5  0.7 - 4.0 K/uL   Monocytes Relative 8  3 - 12 %   Monocytes Absolute 0.5  0.1 - 1.0 K/uL   Eosinophils Relative 2  0 - 5 %   Eosinophils Absolute 0.1  0.0 - 0.7 K/uL   Basophils Relative 1  0 - 1 %   Basophils Absolute 0.0  0.0 - 0.1 K/uL  COMPREHENSIVE METABOLIC PANEL     Status: Abnormal   Collection Time    05/08/12 12:48 PM      Result Value Range   Sodium 138  135 - 145 mEq/L   Potassium 4.1  3.5 - 5.1 mEq/L   Chloride 102  96 - 112 mEq/L   CO2 27  19 - 32 mEq/L   Glucose, Bld 106 (*) 70 - 99 mg/dL   BUN 26 (*) 6 - 23 mg/dL   Creatinine, Ser 1.47 (*) 0.50 - 1.35 mg/dL   Calcium 8.9  8.4 - 82.9 mg/dL   Total Protein  6.4  6.0 - 8.3 g/dL   Albumin 3.2 (*) 3.5 - 5.2 g/dL   AST 19  0 - 37 U/L   ALT 15  0 - 53 U/L   Alkaline Phosphatase 58  39 - 117 U/L   Total Bilirubin 0.3  0.3 - 1.2 mg/dL   GFR calc non Af Amer 38 (*) >90 mL/min   GFR calc Af Amer 44 (*) >90 mL/min   Comment:            The eGFR has been calculated     using the CKD EPI equation.     This calculation has not been     validated in all clinical     situations.     eGFR's persistently     <90 mL/min signify     possible Chronic Kidney Disease.  TROPONIN I     Status: None   Collection Time    05/08/12 12:48 PM      Result Value Range   Troponin I <0.30  <0.30 ng/mL   Comment:            Due to the release kinetics of cTnI,     a negative result within the first hours     of the onset of symptoms does not rule out     myocardial infarction with certainty.     If myocardial infarction is still suspected,     repeat the test at appropriate intervals.  URINALYSIS, ROUTINE W REFLEX MICROSCOPIC     Status: Abnormal   Collection Time    05/08/12  1:30 PM      Result Value Range   Color, Urine YELLOW  YELLOW   APPearance CLOUDY (*) CLEAR   Specific Gravity, Urine 1.008  1.005 - 1.030   pH 7.0  5.0 - 8.0   Glucose, UA NEGATIVE  NEGATIVE mg/dL   Hgb urine dipstick LARGE (*) NEGATIVE   Bilirubin Urine NEGATIVE  NEGATIVE   Ketones, ur NEGATIVE  NEGATIVE mg/dL   Protein, ur 30 (*) NEGATIVE mg/dL   Urobilinogen, UA 0.2  0.0 - 1.0 mg/dL   Nitrite NEGATIVE  NEGATIVE   Leukocytes, UA MODERATE (*) NEGATIVE  URINE MICROSCOPIC-ADD ON     Status: Abnormal   Collection Time    05/08/12  1:30 PM      Result Value Range   Squamous Epithelial / LPF FEW (*) RARE   WBC, UA 11-20  <3 WBC/hpf   RBC / HPF TOO NUMEROUS TO COUNT  <3 RBC/hpf   Bacteria, UA FEW (*) RARE   Urine-Other MUCOUS PRESENT      Ct Head Wo Contrast  05/08/2012  *RADIOLOGY REPORT*  Clinical Data:  Multiple falls.  Frontal trauma.  Increased dizziness and confusion.   Weakness.  Unable to move legs today.  CT HEAD WITHOUT CONTRAST CT MAXILLOFACIAL WITHOUT CONTRAST  Technique:  Multidetector CT imaging of the head and maxillofacial structures were performed using the standard protocol without intravenous contrast. Multiplanar CT image reconstructions of the maxillofacial structures were also generated.  Comparison:  Report of MRI 10/31/2002 at [facility].  The images are no longer available.  CT HEAD  Findings: A mixed density left frontal extra-axial collection is present.  This measures approximate 6 mm.  Left to right midline shift measures 2 mm at the foramen of Monro.  Advanced atrophy and white matter disease is present.  No acute cortical infarct or parenchymal hemorrhages present.  The ventricles are portion to the degree of atrophy.  The paranasal sinuses and mastoid air cells are clear.  The osseous skull is intact.  No significant extracranial soft tissue injury is present.  Atherosclerotic changes are noted.  IMPRESSION:  1.  Left frontal extra-axial mixed density collection is most compatible with a subdural hematoma.  This appears at least several days old. 2.  Mild mass effect despite atrophy with 2 mm midline shift. 3.  Advanced atrophy and white matter disease.  This likely reflects the sequelae of chronic microvascular ischemia.  CT MAXILLOFACIAL  Findings:   The paranasal sinuses are clear.  No significant focal soft tissue injury is present.  There is no acute fracture.  The mandible is intact and located.  The patient is nearly edentulous a single right lateral incisor or canine tooth remains.  Degenerative changes are present in the upper cervical spine.  The subdural hematoma is better seen on the head CT.  Atherosclerotic calcifications at the carotid bifurcations are worse on the right.  IMPRESSION:  1.  No acute facial fracture. 2.  Left subdural hematoma. 3.  Atherosclerosis.  Critical Value/emergent results were called by telephone at the time  of interpretation on 05/08/2012 at 02:25 p.m. to Dr. Devoria Albe, who verbally acknowledged these results.   Original Report Authenticated By: Marin Roberts, M.D.    Dg Abd Acute W/chest  05/08/2012  *RADIOLOGY REPORT*  Clinical Data: Weakness, constipation, fall and history of bladder cancer.  ACUTE ABDOMEN SERIES (ABDOMEN 2 VIEW & CHEST 1 VIEW)  Comparison: Chest x-ray on 04/20/2012 and abdominal CT on 02/16/2012.  Findings: Lungs show stable chronic disease and mild atelectasis at the left base.  No focal consolidation or edema is identified. Heart size is within normal limits.  There is stable ectasia of the thoracic aorta.  Abdominal films demonstrate no evidence of acute bowel obstruction, significant ileus or free intraperitoneal air.  No abnormal calcifications.  Diffuse degenerative changes are present of the lumbar spine.  IMPRESSION: No acute findings.   Original Report Authenticated By: Irish Lack, M.D.    Ct Maxillofacial Wo Cm  05/08/2012  *RADIOLOGY REPORT*  Clinical Data:  Multiple falls.  Frontal trauma.  Increased dizziness and confusion.  Weakness.  Unable to move legs today.  CT HEAD WITHOUT CONTRAST CT MAXILLOFACIAL WITHOUT CONTRAST  Technique:  Multidetector CT imaging of the head and maxillofacial structures were performed using the standard protocol without intravenous contrast. Multiplanar CT image reconstructions of the maxillofacial structures were also generated.  Comparison:  Report of MRI 10/31/2002 at [facility].  The images are no longer available.  CT HEAD  Findings: A mixed density left frontal extra-axial collection is present.  This measures approximate 6 mm.  Left to right midline shift measures 2 mm at the foramen of Monro.  Advanced atrophy and white matter disease is present.  No acute cortical infarct or parenchymal hemorrhages present.  The ventricles are portion to the degree of atrophy.  The paranasal sinuses and mastoid air cells are clear.  The  osseous skull is intact.  No significant extracranial soft tissue injury is present.  Atherosclerotic changes are noted.  IMPRESSION:  1.  Left frontal extra-axial mixed density collection is most compatible with a subdural hematoma.  This appears at least several days old. 2.  Mild mass effect despite atrophy with 2 mm midline shift. 3.  Advanced atrophy and white matter disease.  This likely reflects the sequelae of chronic microvascular ischemia.  CT MAXILLOFACIAL  Findings:   The paranasal sinuses are clear.  No significant focal soft tissue injury is present.  There is no acute fracture.  The mandible is intact and located.  The patient is nearly edentulous a single right lateral incisor or canine tooth remains.  Degenerative changes are present in the upper cervical spine.  The subdural hematoma is better seen on the head CT.  Atherosclerotic calcifications at the carotid bifurcations are worse on the right.  IMPRESSION:  1.  No acute facial fracture. 2.  Left subdural hematoma. 3.  Atherosclerosis.  Critical Value/emergent results were called by telephone at the time of interpretation on 05/08/2012 at 02:25 p.m. to Dr. Devoria Albe, who verbally acknowledged these results.   Original Report Authenticated By: Marin Roberts, M.D.     Review of systems not obtained due to patient factors. Blood pressure 159/71, pulse 71, temperature 97.5 F (36.4 C), temperature source Oral, resp. rate 18, SpO2 95.00%. The patient is awake, alert, orieneted and conversant. He has no focal weakness on individual muscle testing. He follows complex commands well.  Assessment/Plan: I have reviewed the CT head personally and there is just some low density fluid over the surface with no significant mass. The aptient is not at all somnolent, he has no focal weakness, and this difficulty with gait has been going on for many months and in my oopinion is in not at all related to the tiny fluid collection over the convexity.  There is no need for any neurosurgical intervention, and these falls are most likely just due to a combo of age, and his neuropathy. This is not anything new according to patient. Best course might just to be to get some PT to assess him and help with his gait.  Reinaldo Meeker, MD 05/08/2012, 5:01 PM

## 2012-05-08 NOTE — Telephone Encounter (Signed)
Daughter called, he is worse today.  Unable to lift his feet up.  Extreme dizziness.  Was advised to call 911.  RN f/u with her and EMS is there and taking him to [facility].  Advised to try to redirect to [facility] ED.    He is scheduled for at MRI this afternoon.

## 2012-05-09 DIAGNOSIS — I6200 Nontraumatic subdural hemorrhage, unspecified: Secondary | ICD-10-CM

## 2012-05-09 DIAGNOSIS — R269 Unspecified abnormalities of gait and mobility: Secondary | ICD-10-CM | POA: Diagnosis present

## 2012-05-09 DIAGNOSIS — J449 Chronic obstructive pulmonary disease, unspecified: Secondary | ICD-10-CM

## 2012-05-09 DIAGNOSIS — I10 Essential (primary) hypertension: Secondary | ICD-10-CM

## 2012-05-09 DIAGNOSIS — N390 Urinary tract infection, site not specified: Secondary | ICD-10-CM

## 2012-05-09 MED ORDER — PANTOPRAZOLE SODIUM 40 MG PO TBEC
40.0000 mg | DELAYED_RELEASE_TABLET | Freq: Every day | ORAL | Status: DC
  Administered 2012-05-10 – 2012-05-11 (×2): 40 mg via ORAL
  Filled 2012-05-09 (×3): qty 1

## 2012-05-09 MED ORDER — ENSURE COMPLETE PO LIQD
237.0000 mL | Freq: Two times a day (BID) | ORAL | Status: DC
  Administered 2012-05-10 – 2012-05-11 (×3): 237 mL via ORAL

## 2012-05-09 NOTE — Progress Notes (Signed)
[facility] PROGRESS NOTE  Thomas [name] NWG:[id number] DOB: 02/26/1917 DOA: 05/08/2012 PCP: Nelwyn Salisbury, MD  Assessment/Plan:  #1 subdural hematoma Secondary to mechanical fall. Patient with no focal neurological deficits. Patient is alert and oriented x3. Patient has been seen in consultation by neurosurgery and feel that patient's difficulty with his gait has been ongoing for many months and not related to the subdural hematoma. Neurosurgery feels no neurosurgical intervention is needed at this time. Aspirin on hold. Follow.  #2 urinary tract infection Urine cultures are pending. Continue IV Rocephin.  #3 falls Likely secondary to mechanical fall and neuropathy. PT OT.  #4 gait abnormality/weakness and declining function in activities Ongoing for several months. PT OT.  #5 chronic kidney disease stage III Stable.  #6 hypertension Continue home regimen of Cardizem, mavik.  #7 gastroesophageal reflux disease PPI.  #8 COPD Stable. Continue clear.  #9 prophylaxis PPI for GI prophylaxis. SCDs for DVT prophylaxis. Per nursing patient refusing SCDs.  Code Status: DO NOT RESUSCITATE Family Communication: Updated patient no family at bedside. Disposition Plan: Home versus SNF when medically stable   Consultants:  Neurosurgery: Dr. Thelma Barge 05/08/2012  Procedures:  CT head 05/08/2012  Antibiotics: IV Rocephin 05/08/2012  HPI/Subjective: Patient denies any shortness of breath. No chest pain. Patient states he just doesn't feel well. Patient with BM yesterday. Patient alert and oriented x3.  Objective: Filed Vitals:   05/09/12 0000 05/09/12 0400 05/09/12 0555 05/09/12 0800  BP:   163/73   Pulse:   66   Temp: 97.9 F (36.6 C) 98.2 F (36.8 C)  97.4 F (36.3 C)  TempSrc: Oral Oral  Axillary  Resp:   22   Height:      Weight:      SpO2:   95%     Intake/Output Summary (Last 24 hours) at 05/09/12 0925 Last data filed at 05/09/12 0800  Gross per 24  hour  Intake    120 ml  Output    651 ml  Net   -531 ml   Filed Weights   05/08/12 1800  Weight: 64.9 kg (143 lb 1.3 oz)    Exam:   General:  NAD  Cardiovascular: RRR. No lower extremity edema.  Respiratory: CTAB  Abdomen: Soft, nontender, nondistended, positive bowel sounds.  Data Reviewed: Basic Metabolic Panel:  Recent Labs Lab 05/07/12 1500 05/08/12 1248  NA 141 138  K 4.1 4.1  CL 104 102  CO2 29 27  GLUCOSE 108* 106*  BUN 29* 26*  CREATININE 1.7* 1.51*  CALCIUM 8.9 8.9   Liver Function Tests:  Recent Labs Lab 05/07/12 1500 05/08/12 1248  AST 20 19  ALT 17 15  ALKPHOS 53 58  BILITOT 0.4 0.3  PROT 6.4 6.4  ALBUMIN 3.3* 3.2*   No results found for this basename: LIPASE, AMYLASE,  in the last 168 hours No results found for this basename: AMMONIA,  in the last 168 hours CBC:  Recent Labs Lab 05/07/12 1500 05/08/12 1248  WBC 6.8 6.8  NEUTROABS 4.4 4.6  HGB 14.0 14.0  HCT 41.3 40.7  MCV 94.7 93.8  PLT 206.0 189   Cardiac Enzymes:  Recent Labs Lab 05/08/12 1248  TROPONINI <0.30   BNP (last 3 results) No results found for this basename: PROBNP,  in the last 8760 hours CBG: No results found for this basename: GLUCAP,  in the last 168 hours  Recent Results (from the past 240 hour(s))  MRSA PCR SCREENING     Status: None  Collection Time    05/08/12  5:58 PM      Result Value Range Status   MRSA by PCR NEGATIVE  NEGATIVE Final   Comment:            The GeneXpert MRSA Assay (FDA     approved for NASAL specimens     only), is one component of a     comprehensive MRSA colonization     surveillance program. It is not     intended to diagnose MRSA     infection nor to guide or     monitor treatment for     MRSA infections.     Studies: Ct Head Wo Contrast  05/08/2012  *RADIOLOGY REPORT*  Clinical Data:  Multiple falls.  Frontal trauma.  Increased dizziness and confusion.  Weakness.  Unable to move legs today.  CT HEAD WITHOUT  CONTRAST CT MAXILLOFACIAL WITHOUT CONTRAST  Technique:  Multidetector CT imaging of the head and maxillofacial structures were performed using the standard protocol without intravenous contrast. Multiplanar CT image reconstructions of the maxillofacial structures were also generated.  Comparison:  Report of MRI 10/31/2002 at [facility].  The images are no longer available.  CT HEAD  Findings: A mixed density left frontal extra-axial collection is present.  This measures approximate 6 mm.  Left to right midline shift measures 2 mm at the foramen of Monro.  Advanced atrophy and white matter disease is present.  No acute cortical infarct or parenchymal hemorrhages present.  The ventricles are portion to the degree of atrophy.  The paranasal sinuses and mastoid air cells are clear.  The osseous skull is intact.  No significant extracranial soft tissue injury is present.  Atherosclerotic changes are noted.  IMPRESSION:  1.  Left frontal extra-axial mixed density collection is most compatible with a subdural hematoma.  This appears at least several days old. 2.  Mild mass effect despite atrophy with 2 mm midline shift. 3.  Advanced atrophy and white matter disease.  This likely reflects the sequelae of chronic microvascular ischemia.  CT MAXILLOFACIAL  Findings:   The paranasal sinuses are clear.  No significant focal soft tissue injury is present.  There is no acute fracture.  The mandible is intact and located.  The patient is nearly edentulous a single right lateral incisor or canine tooth remains.  Degenerative changes are present in the upper cervical spine.  The subdural hematoma is better seen on the head CT.  Atherosclerotic calcifications at the carotid bifurcations are worse on the right.  IMPRESSION:  1.  No acute facial fracture. 2.  Left subdural hematoma. 3.  Atherosclerosis.  Critical Value/emergent results were called by telephone at the time of interpretation on 05/08/2012 at 02:25 p.m. to Dr. Devoria Albe, who verbally acknowledged these results.   Original Report Authenticated By: Marin Roberts, M.D.    Dg Abd Acute W/chest  05/08/2012  *RADIOLOGY REPORT*  Clinical Data: Weakness, constipation, fall and history of bladder cancer.  ACUTE ABDOMEN SERIES (ABDOMEN 2 VIEW & CHEST 1 VIEW)  Comparison: Chest x-ray on 04/20/2012 and abdominal CT on 02/16/2012.  Findings: Lungs show stable chronic disease and mild atelectasis at the left base.  No focal consolidation or edema is identified. Heart size is within normal limits.  There is stable ectasia of the thoracic aorta.  Abdominal films demonstrate no evidence of acute bowel obstruction, significant ileus or free intraperitoneal air.  No abnormal calcifications.  Diffuse degenerative changes are present of the  lumbar spine.  IMPRESSION: No acute findings.   Original Report Authenticated By: Irish Lack, M.D.    Ct Maxillofacial Wo Cm  05/08/2012  *RADIOLOGY REPORT*  Clinical Data:  Multiple falls.  Frontal trauma.  Increased dizziness and confusion.  Weakness.  Unable to move legs today.  CT HEAD WITHOUT CONTRAST CT MAXILLOFACIAL WITHOUT CONTRAST  Technique:  Multidetector CT imaging of the head and maxillofacial structures were performed using the standard protocol without intravenous contrast. Multiplanar CT image reconstructions of the maxillofacial structures were also generated.  Comparison:  Report of MRI 10/31/2002 at [facility].  The images are no longer available.  CT HEAD  Findings: A mixed density left frontal extra-axial collection is present.  This measures approximate 6 mm.  Left to right midline shift measures 2 mm at the foramen of Monro.  Advanced atrophy and white matter disease is present.  No acute cortical infarct or parenchymal hemorrhages present.  The ventricles are portion to the degree of atrophy.  The paranasal sinuses and mastoid air cells are clear.  The osseous skull is intact.  No significant extracranial soft  tissue injury is present.  Atherosclerotic changes are noted.  IMPRESSION:  1.  Left frontal extra-axial mixed density collection is most compatible with a subdural hematoma.  This appears at least several days old. 2.  Mild mass effect despite atrophy with 2 mm midline shift. 3.  Advanced atrophy and white matter disease.  This likely reflects the sequelae of chronic microvascular ischemia.  CT MAXILLOFACIAL  Findings:   The paranasal sinuses are clear.  No significant focal soft tissue injury is present.  There is no acute fracture.  The mandible is intact and located.  The patient is nearly edentulous a single right lateral incisor or canine tooth remains.  Degenerative changes are present in the upper cervical spine.  The subdural hematoma is better seen on the head CT.  Atherosclerotic calcifications at the carotid bifurcations are worse on the right.  IMPRESSION:  1.  No acute facial fracture. 2.  Left subdural hematoma. 3.  Atherosclerosis.  Critical Value/emergent results were called by telephone at the time of interpretation on 05/08/2012 at 02:25 p.m. to Dr. Devoria Albe, who verbally acknowledged these results.   Original Report Authenticated By: Marin Roberts, M.D.     Scheduled Meds: . cefTRIAXone (ROCEPHIN)  IV  1 g Intravenous Q24H  . diltiazem  180 mg Oral q morning - 10a  . latanoprost  1 drop Both Eyes QHS  . mometasone-formoterol  2 puff Inhalation BID  . multivitamin with minerals  1 tablet Oral Daily  . senna-docusate  1 tablet Oral BID  . sodium chloride  3 mL Intravenous Q12H  . trandolapril  4 mg Oral Daily   Continuous Infusions:   Principal Problem:   Subdural hematoma Active Problems:   VITAMIN B12 DEFICIENCY   HYPERLIPIDEMIA   NEUROPATHY   HYPERTENSION   CORONARY ARTERY DISEASE   GERD   BENIGN PROSTATIC HYPERTROPHY, WITH OBSTRUCTION   COPD (chronic obstructive pulmonary disease)   Bladder outlet obstruction   UTI (lower urinary tract infection)   Abnormality  of gait    Time spent: .>35 mins    [facility]  [facility] Pager [phone]. If 8PM-8AM, please contact night-coverage at [email or web address], password [facility] 05/09/2012, 9:25 AM  LOS: 1 day

## 2012-05-09 NOTE — Progress Notes (Signed)
Pt arrived from [facility] via bed, alert and pleasant, no distress. Placed on Telemetry box 24, and verified with CMT.

## 2012-05-09 NOTE — Progress Notes (Signed)
INITIAL NUTRITION ASSESSMENT  DOCUMENTATION CODES Per approved criteria  -Not Applicable   INTERVENTION: Provide Ensure BID  NUTRITION DIAGNOSIS: Inadequate oral intake related to decreased appetite and pain as evidenced by pt eating 50% of meals.   Goal: Pt to meet >/= 90% of their estimated nutrition needs  Monitor:  PO intake Wt trend  Reason for Assessment: MST score of 2  79 y.o. male  Admitting Dx: Subdural hematoma  ASSESSMENT: 79 y.o. male with PMH significant for BPH, s/p TURP 04-25-2012, CAD on aspirin presents with weakness, inability to ambulate, progressive confusion that started after a fall 1 week prior to admission. Pt states that his appetite is okay; decreased slightly due to pain. Pt states he was eating well PTA- 3 meals per day and 1 Ensure daily. Pt reports that his usual body weight is 165 lb and that he started losing weight 3 months ago but, wt history shows that pt has maintained 143 lb for the past 4 months. Pt reports eating about 50% of meals.   Height: Ht Readings from Last 1 Encounters:  05/08/12 5\' 11"  (1.803 m)    Weight: Wt Readings from Last 1 Encounters:  05/08/12 143 lb 1.3 oz (64.9 kg)    Ideal Body Weight: 172 lb  % Ideal Body Weight: 83%  Wt Readings from Last 10 Encounters:  05/08/12 143 lb 1.3 oz (64.9 kg)  04/25/12 145 lb 15.1 oz (66.2 kg)  04/25/12 145 lb 15.1 oz (66.2 kg)  04/20/12 146 lb (66.225 kg)  01/02/12 143 lb 1.3 oz (64.9 kg)  12/23/11 143 lb (64.864 kg)  12/13/11 143 lb 14.4 oz (65.273 kg)  12/08/11 147 lb 6.4 oz (66.86 kg)  12/05/11 147 lb (66.679 kg)  10/10/11 152 lb (68.947 kg)    Usual Body Weight: 165 lb  % Usual Body Weight: 87%  BMI:  Body mass index is 19.96 kg/(m^2).  Estimated Nutritional Needs: Kcal: [address] Protein: 65-78 grams Fluid: >1.9 L/day  Skin: WDL  Diet Order: Cardiac  EDUCATION NEEDS: -No education needs identified at this time   Intake/Output Summary (Last 24  hours) at 05/09/12 1646 Last data filed at 05/09/12 0800  Gross per 24 hour  Intake    120 ml  Output    651 ml  Net   -531 ml    Last BM: 2/13  Labs:   Recent Labs Lab 05/07/12 1500 05/08/12 1248  NA 141 138  K 4.1 4.1  CL 104 102  CO2 29 27  BUN 29* 26*  CREATININE 1.7* 1.51*  CALCIUM 8.9 8.9  GLUCOSE 108* 106*    CBG (last 3)  No results found for this basename: GLUCAP,  in the last 72 hours  Scheduled Meds: . cefTRIAXone (ROCEPHIN)  IV  1 g Intravenous Q24H  . diltiazem  180 mg Oral q morning - 10a  . latanoprost  1 drop Both Eyes QHS  . mometasone-formoterol  2 puff Inhalation BID  . multivitamin with minerals  1 tablet Oral Daily  . pantoprazole  40 mg Oral Q0600  . senna-docusate  1 tablet Oral BID  . sodium chloride  3 mL Intravenous Q12H  . trandolapril  4 mg Oral Daily    Continuous Infusions:   Past Medical History  Diagnosis Date  . Mild aortic stenosis     last echo 08/2006  . Coronary artery disease     s/p PCI in 1995; Cath 2005 showed moderate 2 vessel CAD involving the distal Left main &  LCx/OM, treated medically  . Myocardial infarction   . Hyperlipidemia   . Hypertension   . Vitamin B12 deficiency   . Neuropathy   . Allergic rhinitis   . OA (osteoarthritis)   . History of bladder cancer   . GERD (gastroesophageal reflux disease)   . Esophageal stricture   . Chronic kidney disease     over active bladder  . BPH (benign prostatic hypertrophy)     URINARY RETENTION-HAS INDWELLING FOLEY CATHETER  . Memory loss     short term memory loss which pt denies    Past Surgical History  Procedure Laterality Date  . Coronary angioplasty    . Appendectomy    . Cholecystectomy    . Tonsillectomy    . Sigmoidoscopy  1999  . Colonoscopy    . Bilateral elbow surgery    . Left knee replacement    . Carotid endarterectomy  08/2008  . Esophagogastroduodenoscopy      dilation  . Transurethral resection of prostate N/A 04/25/2012    Procedure:  TRANSURETHRAL RESECTION OF THE PROSTATE WITH GYRUS INSTRUMENTS;  Surgeon: Sebastian Ache, MD;  Location: [facility];  Service: Urology;  Laterality: N/A;  . Cystoscopy with biopsy N/A 04/25/2012    Procedure: CYSTOSCOPY WITH BIOPSY;  Surgeon: Sebastian Ache, MD;  Location: [facility];  Service: Urology;  Laterality: N/A;    Ian Malkin RD, LDN Inpatient Clinical Dietitian Pager: [phone] After Hours Pager: [phone]

## 2012-05-09 NOTE — Clinical Social Work Psychosocial (Signed)
Clinical Social Work Department BRIEF PSYCHOSOCIAL ASSESSMENT 05/09/2012  Patient:  Thomas [name], Thomas [name]     Account Number:  [id number]     Admit date:  05/08/2012  Clinical Social Worker:  Jodelle Red  Date/Time:  05/09/2012 11:29 AM  Referred by:  RN  Date Referred:  05/08/2012 Referred for  SNF Placement   Other Referral:   Interview type:  Patient Other interview type:   phone call with daughter, [name]    PSYCHOSOCIAL DATA Living Status:  WIFE Admitted from facility:   Level of care:   Primary support name:  Bethel Gaglio Primary support relationship to patient:  SPOUSE Degree of support available:   good, lives with wife. Daughter does not want wife visiting since she could not have her flu shot.  Pt and wife has CNA that is in the home 3 hours a day.    CURRENT CONCERNS Current Concerns  Post-Acute Placement   Other Concerns:    SOCIAL WORK ASSESSMENT / PLAN CSW spoke with daughter at length over the phone. She described many falls at home and progressive weakness. She is insistent that Pt go to SNF for rehab. She reports Pt's wife was at [facility] recently and they would like Pt to go there. Daughter reports Pt agrees to plan currently. CSW confirmed this plan with Pt and he agrees to SNF currently.   Assessment/plan status:  Psychosocial Support/Ongoing Assessment of Needs Other assessment/ plan:   work up for SNF.   Information/referral to community resources:    PATIENT'S/FAMILY'S RESPONSE TO PLAN OF CARE: CSW will work up and plan for SNF. Pt and family aware and agree to plan. Daughter very concerned that Pt be inpt and stay in order for medicare to cover. She reports her mother was obs for 5 days and are currently stuck with her bill. CSW to follow.    Doreen Salvage, LCSW ICU/Stepdown Clinical Social Worker [facility] Cell [phone] Hours 8am-1200pm M-F

## 2012-05-09 NOTE — Progress Notes (Signed)
CARE MANAGEMENT NOTE 05/09/2012  Patient:  Thomas [name], Thomas [name]   Account Number:  [id number]  Date Initiated:  05/09/2012  Documentation initiated by:  DAVIS,RHONDA  Subjective/Objective Assessment:   pt unable to walk , s/p turp in early feb 2014, dfct of head -slubdural several days old with a 2mm midline shift.     Action/Plan:   from home   Anticipated DC Date:  05/12/2012   Anticipated DC Plan:  HOME W HOME HEALTH SERVICES         Choice offered to / List presented to:             Status of service:  In process, will continue to follow Medicare Important Message given?   (If response is "NO", the following Medicare IM given date fields will be blank) Date Medicare IM given:   Date Additional Medicare IM given:    Discharge Disposition:    Per UR Regulation:  Reviewed for med. necessity/level of care/duration of stay  If discussed at Long Length of Stay Meetings, dates discussed:    Comments:  [id number] Earlene Plater, RN, BSN, CCM:  CHART REVIEWED AND UPDATED.  Next chart review due on [id number]. NO DISCHARGE NEEDS PRESENT AT THIS TIME. CASE MANAGEMENT [phone]

## 2012-05-10 DIAGNOSIS — N289 Disorder of kidney and ureter, unspecified: Secondary | ICD-10-CM

## 2012-05-10 DIAGNOSIS — R413 Other amnesia: Secondary | ICD-10-CM | POA: Diagnosis present

## 2012-05-10 LAB — CBC
Hemoglobin: 14.2 g/dL (ref 13.0–17.0)
MCH: 31.6 pg (ref 26.0–34.0)
MCV: 94 fL (ref 78.0–100.0)
Platelets: 200 10*3/uL (ref 150–400)
RBC: 4.5 MIL/uL (ref 4.22–5.81)

## 2012-05-10 LAB — URINE CULTURE: Colony Count: >=100000

## 2012-05-10 LAB — VITAMIN B12: Vitamin B-12: 616 pg/mL (ref 211–911)

## 2012-05-10 LAB — BASIC METABOLIC PANEL
CO2: 27 mEq/L (ref 19–32)
Calcium: 9 mg/dL (ref 8.4–10.5)
Creatinine, Ser: 1.4 mg/dL — ABNORMAL HIGH (ref 0.50–1.35)
Glucose, Bld: 99 mg/dL (ref 70–99)

## 2012-05-10 MED ORDER — SODIUM CHLORIDE 0.9 % IV SOLN
250.0000 mL | INTRAVENOUS | Status: DC | PRN
  Administered 2012-05-10: 250 mL via INTRAVENOUS

## 2012-05-10 MED ORDER — SODIUM CHLORIDE 0.9 % IV BOLUS (SEPSIS)
500.0000 mL | Freq: Once | INTRAVENOUS | Status: AC
  Administered 2012-05-10: 15:00:00 via INTRAVENOUS

## 2012-05-10 NOTE — Evaluation (Signed)
Physical Therapy Evaluation Patient Details Name: Thomas [name] MRN: [id number] DOB: Aug 02, 1916 Today's Date: 05/10/2012 Time: 4098-1191 PT Time Calculation (min): 23 min  PT Assessment / Plan / Recommendation Clinical Impression  79 y.o. male admitted with weakness, falls, confusion, difficulty walking. Dx: SDH. Pt performed bed to chair transfer with mod Assist. He would benefit from acute PT to maximize safety and independence with mobility. Pt agreeable to SNF.     PT Assessment  Patient needs continued PT services    Follow Up Recommendations  SNF    Does the patient have the potential to tolerate intense rehabilitation      Barriers to Discharge None      Equipment Recommendations  None recommended by PT    Recommendations for Other Services OT consult   Frequency Min 3X/week    Precautions / Restrictions Precautions Precautions: Fall Precaution Comments: h/o falls Restrictions Weight Bearing Restrictions: No   Pertinent Vitals/Pain *BP 153/93 supine; 112/76 sitting; 121/71 standing Pt reported dizziness in sitting**      Mobility  Bed Mobility Bed Mobility: Supine to Sit Supine to Sit: 1: +2 Total assist Supine to Sit: Patient Percentage: 40% Details for Bed Mobility Assistance: VCs hand placement Transfers Transfers: Stand Pivot Transfers Sit to Stand: 3: Mod assist Stand Pivot Transfers: 3: Mod assist Details for Transfer Assistance: mod A to maneuver RW and for safety Ambulation/Gait Ambulation/Gait Assistance: Not tested (comment)    Exercises     PT Diagnosis: Difficulty walking;Generalized weakness  PT Problem List: Decreased activity tolerance;Decreased mobility;Decreased safety awareness PT Treatment Interventions: Gait training;DME instruction;Functional mobility training;Patient/family education;Therapeutic exercise;Therapeutic activities   PT Goals Acute Rehab PT Goals PT Goal Formulation: With patient Time For Goal Achievement:  05/17/12 Potential to Achieve Goals: Good Pt will go Supine/Side to Sit: with min assist PT Goal: Supine/Side to Sit - Progress: Goal set today Pt will go Sit to Stand: with min assist PT Goal: Sit to Stand - Progress: Goal set today Pt will go Stand to Sit: with supervision;with upper extremity assist PT Goal: Stand to Sit - Progress: Goal set today Pt will Ambulate: 16 - 50 feet;with min assist;with rolling walker PT Goal: Ambulate - Progress: Goal set today Pt will Perform Home Exercise Program: with supervision, verbal cues required/provided PT Goal: Perform Home Exercise Program - Progress: Goal set today  Visit Information  Last PT Received On: 05/10/12 Assistance Needed: +2    Subjective Data  Subjective: I haven't been up in a week. This feels good.  Patient Stated Goal: DC to [address]   Prior Functioning  Home Living Lives With: Spouse Available Help at Discharge: Skilled Nursing Facility Additional Comments: multiple falls over past 6-12 months.  Pt reports falling off commode when leaning forward.  Does not c/o dizziness but will check for BPPV next visit (pt needed to use bathroom this visit) Prior Function Level of Independence: Needs assistance Needs Assistance:  (wife helps with socks/shoes) Comments: used RW PTA Communication Communication: No difficulties    Cognition  Cognition Overall Cognitive Status: Appears within functional limits for tasks assessed/performed Arousal/Alertness: Awake/alert Orientation Level: Appears intact for tasks assessed Behavior During Session: [facility] for tasks performed    Extremity/Trunk Assessment Right Upper Extremity Assessment RUE ROM/Strength/Tone: Within functional levels Left Upper Extremity Assessment LUE ROM/Strength/Tone: Within functional levels Right Lower Extremity Assessment RLE ROM/Strength/Tone: Within functional levels RLE Sensation: History of peripheral neuropathy Left Lower Extremity Assessment LLE  ROM/Strength/Tone: Within functional levels LLE Sensation: History of peripheral neuropathy  Trunk Assessment Trunk Assessment: Kyphotic   Balance    End of Session PT - End of Session Equipment Utilized During Treatment: Gait belt Activity Tolerance: Patient limited by fatigue;Patient tolerated treatment well Patient left: in chair;with call bell/phone within reach;with chair alarm set Nurse Communication: Mobility status  GP     Thomas [name] 05/10/2012, 1:02 PM [phone]

## 2012-05-10 NOTE — Progress Notes (Signed)
[facility] PROGRESS NOTE  Thomas [name] ZOX:[id number] DOB: Feb 21, 1917 DOA: 05/08/2012 PCP: Nelwyn Salisbury, MD  Assessment/Plan:  #1 subdural hematoma Secondary to mechanical fall. Patient with no focal neurological deficits. Patient is alert and oriented x3. Patient has been seen in consultation by neurosurgery and feel that patient's difficulty with his gait has been ongoing for many months and not related to the subdural hematoma. Neurosurgery feels no neurosurgical intervention is needed at this time. Aspirin on hold. Follow.  #2 urinary tract infection Urine cultures are pending. Continue IV Rocephin.  #3 falls Likely secondary to mechanical fall and neuropathy. PT OT.  #4 gait abnormality/weakness and declining function in activities Ongoing for several months. PT OT.  #5 chronic kidney disease stage III Stable.  #6 hypertension Continue home regimen of Cardizem, mavik.  #7 gastroesophageal reflux disease PPI.  #8 COPD Stable. Continue clear.  #9 Memory problems Likely secondary to dementia. Will check B12, RPR, RBC folate. Follow.  #9 prophylaxis PPI for GI prophylaxis. SCDs for DVT prophylaxis. Per nursing patient refusing SCDs.  Code Status: DO NOT RESUSCITATE Family Communication: Updated patient no family at bedside. Disposition Plan: Home versus SNF when medically stable   Consultants:  Neurosurgery: Dr. Thelma Barge 05/08/2012  Procedures:  CT head 05/08/2012  Antibiotics: IV Rocephin 05/08/2012  HPI/Subjective: Patient denies any shortness of breath. No chest pain. No complaints.   Objective: Filed Vitals:   05/09/12 2144 05/10/12 0509 05/10/12 0753 05/10/12 1336  BP:  149/70  121/80  Pulse:  69  77  Temp:  97.5 F (36.4 C)  97 F (36.1 C)  TempSrc:  Oral  Oral  Resp:  18  18  Height:      Weight:      SpO2: 95% 94% 94% 96%    Intake/Output Summary (Last 24 hours) at 05/10/12 1407 Last data filed at 05/10/12 1300  Gross per 24  hour  Intake    600 ml  Output   2050 ml  Net  -1450 ml   Filed Weights   05/08/12 1800  Weight: 64.9 kg (143 lb 1.3 oz)    Exam:   General:  NAD  Cardiovascular: RRR. No lower extremity edema.  Respiratory: CTAB  Abdomen: Soft, nontender, nondistended, positive bowel sounds.  Data Reviewed: Basic Metabolic Panel:  Recent Labs Lab 05/07/12 1500 05/08/12 1248 05/10/12 0505  NA 141 138 141  K 4.1 4.1 3.9  CL 104 102 104  CO2 29 27 27   GLUCOSE 108* 106* 99  BUN 29* 26* 31*  CREATININE 1.7* 1.51* 1.40*  CALCIUM 8.9 8.9 9.0   Liver Function Tests:  Recent Labs Lab 05/07/12 1500 05/08/12 1248  AST 20 19  ALT 17 15  ALKPHOS 53 58  BILITOT 0.4 0.3  PROT 6.4 6.4  ALBUMIN 3.3* 3.2*   No results found for this basename: LIPASE, AMYLASE,  in the last 168 hours No results found for this basename: AMMONIA,  in the last 168 hours CBC:  Recent Labs Lab 05/07/12 1500 05/08/12 1248 05/10/12 0505  WBC 6.8 6.8 8.0  NEUTROABS 4.4 4.6  --   HGB 14.0 14.0 14.2  HCT 41.3 40.7 42.3  MCV 94.7 93.8 94.0  PLT 206.0 189 200   Cardiac Enzymes:  Recent Labs Lab 05/08/12 1248  TROPONINI <0.30   BNP (last 3 results) No results found for this basename: PROBNP,  in the last 8760 hours CBG: No results found for this basename: GLUCAP,  in the last 168 hours  Recent Results (from the past 240 hour(s))  MRSA PCR SCREENING     Status: None   Collection Time    05/08/12  5:58 PM      Result Value Range Status   MRSA by PCR NEGATIVE  NEGATIVE Final   Comment:            The GeneXpert MRSA Assay (FDA     approved for NASAL specimens     only), is one component of a     comprehensive MRSA colonization     surveillance program. It is not     intended to diagnose MRSA     infection nor to guide or     monitor treatment for     MRSA infections.     Studies: No results found.  Scheduled Meds: . cefTRIAXone (ROCEPHIN)  IV  1 g Intravenous Q24H  . diltiazem  180 mg  Oral q morning - 10a  . feeding supplement  237 mL Oral BID BM  . latanoprost  1 drop Both Eyes QHS  . mometasone-formoterol  2 puff Inhalation BID  . multivitamin with minerals  1 tablet Oral Daily  . pantoprazole  40 mg Oral Q0600  . senna-docusate  1 tablet Oral BID  . sodium chloride  3 mL Intravenous Q12H  . trandolapril  4 mg Oral Daily   Continuous Infusions:   Principal Problem:   Subdural hematoma Active Problems:   VITAMIN B12 DEFICIENCY   HYPERLIPIDEMIA   NEUROPATHY   HYPERTENSION   CORONARY ARTERY DISEASE   GERD   BENIGN PROSTATIC HYPERTROPHY, WITH OBSTRUCTION   COPD (chronic obstructive pulmonary disease)   Bladder outlet obstruction   UTI (lower urinary tract infection)   Abnormality of gait   Memory loss    Time spent: .>35 mins    [facility] [place]  [facility] Pager [phone]. If 8PM-8AM, please contact night-coverage at [email or web address], password [facility] 05/10/2012, 2:07 PM  LOS: 2 days

## 2012-05-10 NOTE — Evaluation (Addendum)
Occupational Therapy Evaluation Patient Details Name: Thomas [name] MRN: [id number] DOB: [birth year] Today's Date: 05/10/2012 Time: 9562-1308 OT Time Calculation (min): 23 min  OT Assessment / Plan / Recommendation Clinical Impression  This 79 year old man was admitted for SDH.  He has a h/o multiple falls over months.  He will benefit from skilled OT to increase safety and independence with adls.      OT Assessment  Patient needs continued OT Services    Follow Up Recommendations  SNF    Barriers to Discharge      Equipment Recommendations  3 in 1 bedside comode    Recommendations for Other Services    Frequency  Min 2X/week    Precautions / Restrictions Precautions Precautions: Fall Restrictions Weight Bearing Restrictions: No   Pertinent Vitals/Pain No c/o pain  Orthostatic BPs done:  Supine:  155/83  Sit:  112/76; stand:  121/71  Pt dizzy initially when he sat up    ADL  Toilet Transfer: Performed;Moderate assistance (mod A sit to stand, min A steps) Toilet Transfer Method: Stand pivot Toilet Transfer Equipment: Bedside commode Toileting - Clothing Manipulation and Hygiene: Performed;Minimal assistance Where Assessed - Engineer, mining and Hygiene: Sit to stand from 3-in-1 or toilet Equipment Used: Gait belt;Rolling walker Transfers/Ambulation Related to ADLs: pt initially dizzy--resolved.  Orthostatics taken ADL Comments: Pt has assist with shoes/socks.  Will look at BPPV next visit and further assess reach.  UB set up for bathing and min A for dressing due to IV.  LB overall mod A bathing and max dressing due to not reaching beyond knees    OT Diagnosis: Generalized weakness  OT Problem List: Decreased strength;Decreased activity tolerance;Impaired balance (sitting and/or standing);Decreased knowledge of use of DME or AE (neuropathy) OT Treatment Interventions: Self-care/ADL training;DME and/or AE instruction;Patient/family education;Balance training    OT Goals Acute Rehab OT Goals OT Goal Formulation: With patient Time For Goal Achievement: 05/24/12 Potential to Achieve Goals: Good ADL Goals Pt Will Perform Grooming: with min assist;Standing at sink (min guard) ADL Goal: Grooming - Progress: Goal set today Pt Will Perform Lower Body Bathing: with min assist;Sit to stand from chair;with adaptive equipment ADL Goal: Lower Body Bathing - Progress: Goal set today Pt Will Perform Lower Body Dressing: with min assist;Sit to stand from chair;with adaptive equipment ADL Goal: Lower Body Dressing - Progress: Goal set today Pt Will Transfer to Toilet: with min assist;Ambulation;3-in-1 ADL Goal: Toilet Transfer - Progress: Goal set today  Visit Information  Last OT Received On: 05/10/12 Assistance Needed: +2 (bed mobility and ambulation) PT/OT Co-Evaluation/Treatment: Yes    Subjective Data  Subjective: Oh good.  It feels so good to be up.  I've been in bed a week Patient Stated Goal: go to [facility] for rehab   Prior Functioning     Home Living Lives With: Spouse Additional Comments: multiple falls over past 6-12 months.  Pt reports falling off commode when leaning forward.  Does not c/o dizziness but will check for BPPV next visit (pt needed to use bathroom this visit) Prior Function Level of Independence: Needs assistance Needs Assistance:  (wife helps with socks/shoes) Communication Communication: No difficulties         Vision/Perception     Cognition  Cognition Overall Cognitive Status: Appears within functional limits for tasks assessed/performed Arousal/Alertness: Awake/alert Orientation Level: Appears intact for tasks assessed Behavior During Session: [facility] for tasks performed    Extremity/Trunk Assessment Right Upper Extremity Assessment RUE ROM/Strength/Tone: Within functional levels  Left Upper Extremity Assessment LUE ROM/Strength/Tone: Within functional levels     Mobility Bed Mobility Bed Mobility:  Supine to Sit Supine to Sit: 1: +2 Total assist Supine to Sit: Patient Percentage: 40% Transfers Transfers: Sit to Stand Sit to Stand: 3: Mod assist     Exercise     Balance     End of Session OT - End of Session Activity Tolerance: Patient tolerated treatment well Patient left: in chair;with call bell/phone within reach;with chair alarm set  GO     Daphna Lafuente 05/10/2012, 12:48 PM Marica Otter, OTR/L [phone] 05/10/2012

## 2012-05-10 NOTE — Progress Notes (Signed)
Patient instructed on proper use of inhaler with spacer. Patient was able to demonstrate slow deep in and breath hold for count of 5. Repeated procedure twice

## 2012-05-11 DIAGNOSIS — I951 Orthostatic hypotension: Secondary | ICD-10-CM | POA: Diagnosis present

## 2012-05-11 DIAGNOSIS — F0390 Unspecified dementia without behavioral disturbance: Secondary | ICD-10-CM

## 2012-05-11 DIAGNOSIS — H543 Unqualified visual loss, both eyes: Secondary | ICD-10-CM

## 2012-05-11 DIAGNOSIS — J449 Chronic obstructive pulmonary disease, unspecified: Secondary | ICD-10-CM

## 2012-05-11 DIAGNOSIS — J438 Other emphysema: Secondary | ICD-10-CM

## 2012-05-11 DIAGNOSIS — I2510 Atherosclerotic heart disease of native coronary artery without angina pectoris: Secondary | ICD-10-CM

## 2012-05-11 DIAGNOSIS — N390 Urinary tract infection, site not specified: Secondary | ICD-10-CM

## 2012-05-11 LAB — BASIC METABOLIC PANEL
Chloride: 104 mEq/L (ref 96–112)
GFR calc Af Amer: 47 mL/min — ABNORMAL LOW (ref >90)
GFR calc non Af Amer: 40 mL/min — ABNORMAL LOW (ref >90)
Potassium: 4.3 mEq/L (ref 3.5–5.1)
Sodium: 139 mEq/L (ref 135–145)

## 2012-05-11 LAB — FOLATE RBC: RBC Folate: 1065 ng/mL — ABNORMAL HIGH (ref >=366)

## 2012-05-11 LAB — CBC
MCHC: 33.5 g/dL (ref 30.0–36.0)
Platelets: 180 10*3/uL (ref 150–400)
RDW: 12.1 % (ref 11.5–15.5)
WBC: 7.9 10*3/uL (ref 4.0–10.5)

## 2012-05-11 MED ORDER — TRAMADOL HCL 50 MG PO TABS: 50.0000 mg | ORAL_TABLET | Freq: Four times a day (QID) | ORAL | Status: DC | PRN

## 2012-05-11 MED ORDER — SODIUM CHLORIDE 0.9 % IV SOLN: 250.0000 mL | INTRAVENOUS | Status: DC | PRN

## 2012-05-11 MED ORDER — ENSURE COMPLETE PO LIQD: 237.0000 mL | Freq: Two times a day (BID) | ORAL | Status: DC

## 2012-05-11 MED ORDER — SODIUM CHLORIDE 0.9 % IV BOLUS (SEPSIS)
1000.0000 mL | Freq: Once | INTRAVENOUS | Status: AC
  Administered 2012-05-11: 1000 mL via INTRAVENOUS

## 2012-05-11 MED ORDER — AMPICILLIN 500 MG PO CAPS: 500.0000 mg | ORAL_CAPSULE | Freq: Three times a day (TID) | ORAL | Status: AC

## 2012-05-11 NOTE — Progress Notes (Signed)
Patient is set to discharge to [facility] today. Patient, daughter & wife aware. [facility] called for transport.   Clinical Social Work Department CLINICAL SOCIAL WORK PLACEMENT NOTE 05/11/2012  Patient:  BRAIDAN, RICCIARDI  Account Number:  [id number] Admit date:  05/08/2012  Clinical Social Worker:  Orpah Greek  Date/time:  05/09/2012 11:27 AM  Clinical Social Work is seeking post-discharge placement for this patient at the following level of care:   SKILLED NURSING   (*CSW will update this form in Epic as items are completed)   05/09/2012  Patient/family provided with [facility] Department of Clinical Social Work's list of facilities offering this level of care within the geographic area requested by the patient (or if unable, by the patient's family).  05/09/2012  Patient/family informed of their freedom to choose among providers that offer the needed level of care, that participate in Medicare, Medicaid or managed care program needed by the patient, have an available bed and are willing to accept the patient.  05/09/2012  Patient/family informed of [facility]' ownership interest in [facility], as well as of the fact that they are under no obligation to receive care at this facility.  PASARR submitted to EDS on 05/09/2012 PASARR number received from EDS on 05/09/2012  FL2 transmitted to all facilities in geographic area requested by pt/family on  05/09/2012 FL2 transmitted to all facilities within larger geographic area on   Patient informed that his/her managed care company has contracts with or will negotiate with  certain facilities, including the following:     Patient/family informed of bed offers received:  05/11/2012 Patient chooses bed at [facility] Physician recommends and patient chooses bed at    Patient to be transferred to [facility] on  05/11/2012 Patient to be transferred to facility by [facility]  The following physician request were entered  in Epic:   Additional Comments:  Unice Bailey, LCSW [facility] Clinical Social Worker cell #: [phone]

## 2012-05-11 NOTE — Progress Notes (Signed)
Report called to Diane at [facility].  

## 2012-05-11 NOTE — Progress Notes (Signed)
Thomas [name] refuses SCD's and TEDs due to neuropathy in legs.  He says that he is unable to tolerate them.  Berton Bon RN-BC

## 2012-05-11 NOTE — Progress Notes (Signed)
Physical Therapy Treatment Patient Details Name: Thomas [name] MRN: [id number] DOB: 05-Dec-1916 Today's Date: 05/11/2012 Time: 5621-3086 PT Time Calculation (min): 26 min  PT Assessment / Plan / Recommendation Comments on Treatment Session  Pt orthostatic vitals taken and recorded in docflowsheets however pt reports no dizziness or symptoms of orthostatic hypotension.  Pt able to tolerate ambulation in hallway and plans to d/c to SNF today.    Follow Up Recommendations  SNF     Does the patient have the potential to tolerate intense rehabilitation     Barriers to Discharge        Equipment Recommendations  None recommended by PT    Recommendations for Other Services    Frequency     Plan Discharge plan remains appropriate;Frequency remains appropriate    Precautions / Restrictions Precautions Precautions: Fall Precaution Comments: h/o falls Restrictions Weight Bearing Restrictions: No   Pertinent Vitals/Pain No pain Orthostatic vitals Supine: 150/68 81bpm Sitting: 133/78 95 bpm Standing: 153/130 96 bpm Standing 1 min later: 109/64 114 bpm Pt denies dizziness with all positions.    Mobility  Bed Mobility Bed Mobility: Supine to Sit Supine to Sit: 1: +2 Total assist Supine to Sit: Patient Percentage: 50% Details for Bed Mobility Assistance: verbal cues for technique, assist for upper and lower body Transfers Transfers: Sit to Stand;Stand to Sit;Stand Pivot Transfers Sit to Stand: 3: Mod assist;With upper extremity assist Stand to Sit: 3: Mod assist;With upper extremity assist Stand Pivot Transfers: 3: Mod assist Details for Transfer Assistance: pt assisted over to recliner after orthostatics to rest a minute prior to ambulation, used 2 HHA and +2 for safety Ambulation/Gait Ambulation/Gait Assistance: 3: Mod assist Ambulation Distance (Feet): 140 Feet Assistive device: Rolling walker Ambulation/Gait Assistance Details: +2 for safety, verbal cues for safe use of RW  and step length, pt gait improved with cues for bigger steps Gait Pattern: Step-through pattern;Narrow base of support;Decreased stride length Gait velocity: decreased    Exercises     PT Diagnosis:    PT Problem List:   PT Treatment Interventions:     PT Goals Acute Rehab PT Goals PT Goal: Supine/Side to Sit - Progress: Progressing toward goal PT Goal: Sit to Stand - Progress: Progressing toward goal PT Goal: Stand to Sit - Progress: Progressing toward goal PT Goal: Ambulate - Progress: Progressing toward goal  Visit Information  Last PT Received On: 05/11/12 Assistance Needed: +2    Subjective Data  Subjective: It feels good to walk.  I should have been doing this all along.   Cognition  Cognition Overall Cognitive Status: Appears within functional limits for tasks assessed/performed Arousal/Alertness: Awake/alert Orientation Level: Appears intact for tasks assessed Behavior During Session: [place] for tasks performed    Balance     End of Session PT - End of Session Equipment Utilized During Treatment: Gait belt Activity Tolerance: Patient tolerated treatment well Patient left: in chair;with call bell/phone within reach;with chair alarm set Nurse Communication: Mobility status   GP     Kenishia Plack,KATHrine E 05/11/2012, 10:56 AM Zenovia Jarred, PT, DPT 05/11/2012 Pager: [phone]

## 2012-05-11 NOTE — Discharge Summary (Signed)
Physician Discharge Summary  Thomas [name] ZOX:[id number] DOB: 1916/08/20 DOA: 05/08/2012  PCP: Nelwyn Salisbury, MD  Admit date: 05/08/2012 Discharge date: 05/11/2012  Time spent: 65 minutes  Recommendations for Outpatient Follow-up:  1. Patient will followup with M.D. at the skilled nursing facility. RBC folate would need to be followed up upon as this was pending at time of discharge.  Discharge Diagnoses:  Principal Problem:   Subdural hematoma Active Problems:   VITAMIN B12 DEFICIENCY   HYPERLIPIDEMIA   NEUROPATHY   HYPERTENSION   CORONARY ARTERY DISEASE   GERD   BENIGN PROSTATIC HYPERTROPHY, WITH OBSTRUCTION   COPD (chronic obstructive pulmonary disease)   Bladder outlet obstruction   UTI (lower urinary tract infection)   Abnormality of gait   Memory loss   Orthostasis   Discharge Condition: Stable and improved  Diet recommendation: Regular/dysphagia 3.  Filed Weights   05/08/12 1800  Weight: 64.9 kg (143 lb 1.3 oz)    History of present illness:  Thomas [name] is a 79 y.o. male with PMH significant for BPH, s/p TURP 04-25-2012, CAD on aspirin presents with weakness, inability to ambulate, progressive confusion that started after a fall 1 week prior to admission. History was obtain from daughter who was at bedside. Patient able to provide some history. Per daughter Mr Balsam was able to do daily living activities, he was able to ambulate with a walker. Ever since the fall 1 week ago he has not been able to ambulate. He has increase memory problems, confusion. He was taking some antibiotics after surgery but he finished. He has some loose stool, specially after he takes his laxative. No headaches.  Patient relates dizziness when he bend his head.    Hospital Course:  #1 subdural hematoma  Patient was admitted with a subdural hematoma felt to be secondary to mechanical fall. Patient with no focal neurological deficits. Patient has been seen in consultation by  neurosurgery and feel that patient's difficulty with his gait has been ongoing for many months and not related to the subdural hematoma. Neurosurgery feels no neurosurgical intervention is needed at this time. Patient's aspirin was held. Patient's hemoglobin remained stable throughout the hospitalization. Patient remained stable and subsequently transferred to the telemetry floor from the step down unit. Patient did not have any focal neurological symptoms remained in stable condition and he'll be discharged to a skilled nursing facility off his aspirin. #2 enterococcus urinary tract infection  On admission urinalysis was consistent with a urinary tract infection. Patient was empirically placed on IV Rocephin. Urine cultures grew back greater than 100,000 enterococcus species. Patient remained afebrile with a normal white count. Patient will be discharged on 5 more days of oral ampicillin to complete a one-week course of antibiotic therapy.  #3 orthostasis During the hospitalization the patient was assessed by PT /OT and patient was noted to be orthostatic. Patient was hydrated with IV fluids and followed. Patient was monitored, and on day of discharge patient did have some mild orthostasis however patient was completely asymptomatic. Patient did not complain of any dizziness. Patient refused to wear TED hose as he felt caused discomfort with his neuropathy. Patient remained in stable condition and as he is asymptomatic he will be discharged in stable and improved condition. Patient is currently euvolemic. Patient will followup with M.D. at the skilled nursing facility. #4 falls  Likely secondary to mechanical fall and neuropathy. PT/ OT.  #5 gait abnormality/weakness and declining function in activities  Ongoing for several months. Remained  stable. Patient was seen by PT OT during the hospitalization. Patient did not have any falls.  CT of the head which was done did show a subdural hematoma however patient  did not have any neurological deficits.  #6 chronic kidney disease stage III  Remained stable throughout the hospitalization.  #7 hypertension  Remained stable and patient was maintained on his home regimen of Cardizem and Mavik.  #7 gastroesophageal reflux disease  PPI.  #8 COPD  Stable.   #9 Memory problems  Likely secondary to dementia. RPR was checked which came back nonreactive. B12 levels were within normal limits at 616. TSH which was checked came back within normal limits at 1.70. RBC folate was pending at the time of discharge.     Procedures: CT head 05/08/2012 Acute abdominal series 05/08/2012   Consultations: Neurosurgery: Dr. Gerlene Fee 05/08/2012     Discharge Exam: Filed Vitals:   05/11/12 0945 05/11/12 0946 05/11/12 0947 05/11/12 0948  BP: 150/68 133/78 153/130 109/64  Pulse: 81 95 96 114  Temp:      TempSrc:      Resp:      Height:      Weight:      SpO2:        General: nad Cardiovascular: RRR Respiratory: CTAB  Discharge Instructions      Discharge Orders   Future Orders Complete By Expires     Diet general  As directed     Comments:      Dysphagia 3 diet    Discharge instructions  As directed     Comments:      Get up slowly when going from supine to sitting to standing.    Increase activity slowly  As directed         Medication List    STOP taking these medications       aspirin EC 81 MG tablet      TAKE these medications       ampicillin 500 MG capsule  Commonly known as:  PRINCIPEN  Take 1 capsule (500 mg total) by mouth 3 (three) times daily. Take for 5 days then stop.     diltiazem 180 MG 24 hr capsule  Commonly known as:  DILACOR XR  Take 180 mg by mouth every morning.     feeding supplement Liqd  Take 237 mLs by mouth 2 (two) times daily between meals.     fluticasone 50 MCG/ACT nasal spray  Commonly known as:  FLONASE  Place 2 sprays into the nose daily.     latanoprost 0.005 % ophthalmic solution  Commonly  known as:  XALATAN  Place 1 drop into both eyes at bedtime.     mometasone-formoterol 100-5 MCG/ACT Aero  Commonly known as:  DULERA  Inhale 2 puffs into the lungs 2 (two) times daily.     multivitamin with minerals Tabs  Take 1 tablet by mouth daily.     nitroGLYCERIN 0.4 MG SL tablet  Commonly known as:  NITROSTAT  Place 0.4 mg under the tongue every 5 (five) minutes as needed. For chest pain     sennosides-docusate sodium 8.6-50 MG tablet  Commonly known as:  SENOKOT-S  Take 1 tablet by mouth 2 (two) times daily. While taking pain meds to prevent constipation.     temazepam 30 MG capsule  Commonly known as:  RESTORIL  Take 1 capsule (30 mg total) by mouth at bedtime as needed for sleep.     traMADol 50 MG tablet  Commonly known as:  ULTRAM  Take 1 tablet (50 mg total) by mouth every 6 (six) hours as needed.     trandolapril 4 MG tablet  Commonly known as:  MAVIK  Take 1 tablet (4 mg total) by mouth daily.       Follow-up Information   Please follow up. (f/u with MD at [facility])        The results of significant diagnostics from this hospitalization (including imaging, microbiology, ancillary and laboratory) are listed below for reference.    Significant Diagnostic Studies: Dg Chest 2 View  04/20/2012  *RADIOLOGY REPORT*  Clinical Data: Preoperative assessment for TURP and bladder biopsy, history hypertension, coronary artery disease post MI and PTCA, aortic stenosis, chronic kidney disease, COPD  CHEST - 2 VIEW  Comparison: 01/02/2012  Findings: Normal heart size and pulmonary vascularity. Calcified tortuous aorta. Lungs appear emphysematous but clear. No pleural effusion or pneumothorax. Bones unremarkable.  IMPRESSION: COPD. No acute abnormalities.   Original Report Authenticated By: Ulyses Southward, M.D.    Ct Head Wo Contrast  05/08/2012  *RADIOLOGY REPORT*  Clinical Data:  Multiple falls.  Frontal trauma.  Increased dizziness and confusion.  Weakness.  Unable to move legs  today.  CT HEAD WITHOUT CONTRAST CT MAXILLOFACIAL WITHOUT CONTRAST  Technique:  Multidetector CT imaging of the head and maxillofacial structures were performed using the standard protocol without intravenous contrast. Multiplanar CT image reconstructions of the maxillofacial structures were also generated.  Comparison:  Report of MRI 10/31/2002 at [facility].  The images are no longer available.  CT HEAD  Findings: A mixed density left frontal extra-axial collection is present.  This measures approximate 6 mm.  Left to right midline shift measures 2 mm at the foramen of Monro.  Advanced atrophy and white matter disease is present.  No acute cortical infarct or parenchymal hemorrhages present.  The ventricles are portion to the degree of atrophy.  The paranasal sinuses and mastoid air cells are clear.  The osseous skull is intact.  No significant extracranial soft tissue injury is present.  Atherosclerotic changes are noted.  IMPRESSION:  1.  Left frontal extra-axial mixed density collection is most compatible with a subdural hematoma.  This appears at least several days old. 2.  Mild mass effect despite atrophy with 2 mm midline shift. 3.  Advanced atrophy and white matter disease.  This likely reflects the sequelae of chronic microvascular ischemia.  CT MAXILLOFACIAL  Findings:   The paranasal sinuses are clear.  No significant focal soft tissue injury is present.  There is no acute fracture.  The mandible is intact and located.  The patient is nearly edentulous a single right lateral incisor or canine tooth remains.  Degenerative changes are present in the upper cervical spine.  The subdural hematoma is better seen on the head CT.  Atherosclerotic calcifications at the carotid bifurcations are worse on the right.  IMPRESSION:  1.  No acute facial fracture. 2.  Left subdural hematoma. 3.  Atherosclerosis.  Critical Value/emergent results were called by telephone at the time of interpretation on 05/08/2012  at 02:25 p.m. to Dr. Devoria Albe, who verbally acknowledged these results.   Original Report Authenticated By: Marin Roberts, M.D.    Dg Abd Acute W/chest  05/08/2012  *RADIOLOGY REPORT*  Clinical Data: Weakness, constipation, fall and history of bladder cancer.  ACUTE ABDOMEN SERIES (ABDOMEN 2 VIEW & CHEST 1 VIEW)  Comparison: Chest x-ray on 04/20/2012 and abdominal CT on 02/16/2012.  Findings: Lungs  show stable chronic disease and mild atelectasis at the left base.  No focal consolidation or edema is identified. Heart size is within normal limits.  There is stable ectasia of the thoracic aorta.  Abdominal films demonstrate no evidence of acute bowel obstruction, significant ileus or free intraperitoneal air.  No abnormal calcifications.  Diffuse degenerative changes are present of the lumbar spine.  IMPRESSION: No acute findings.   Original Report Authenticated By: Irish Lack, M.D.    Ct Maxillofacial Wo Cm  05/08/2012  *RADIOLOGY REPORT*  Clinical Data:  Multiple falls.  Frontal trauma.  Increased dizziness and confusion.  Weakness.  Unable to move legs today.  CT HEAD WITHOUT CONTRAST CT MAXILLOFACIAL WITHOUT CONTRAST  Technique:  Multidetector CT imaging of the head and maxillofacial structures were performed using the standard protocol without intravenous contrast. Multiplanar CT image reconstructions of the maxillofacial structures were also generated.  Comparison:  Report of MRI 10/31/2002 at [facility].  The images are no longer available.  CT HEAD  Findings: A mixed density left frontal extra-axial collection is present.  This measures approximate 6 mm.  Left to right midline shift measures 2 mm at the foramen of Monro.  Advanced atrophy and white matter disease is present.  No acute cortical infarct or parenchymal hemorrhages present.  The ventricles are portion to the degree of atrophy.  The paranasal sinuses and mastoid air cells are clear.  The osseous skull is intact.  No  significant extracranial soft tissue injury is present.  Atherosclerotic changes are noted.  IMPRESSION:  1.  Left frontal extra-axial mixed density collection is most compatible with a subdural hematoma.  This appears at least several days old. 2.  Mild mass effect despite atrophy with 2 mm midline shift. 3.  Advanced atrophy and white matter disease.  This likely reflects the sequelae of chronic microvascular ischemia.  CT MAXILLOFACIAL  Findings:   The paranasal sinuses are clear.  No significant focal soft tissue injury is present.  There is no acute fracture.  The mandible is intact and located.  The patient is nearly edentulous a single right lateral incisor or canine tooth remains.  Degenerative changes are present in the upper cervical spine.  The subdural hematoma is better seen on the head CT.  Atherosclerotic calcifications at the carotid bifurcations are worse on the right.  IMPRESSION:  1.  No acute facial fracture. 2.  Left subdural hematoma. 3.  Atherosclerosis.  Critical Value/emergent results were called by telephone at the time of interpretation on 05/08/2012 at 02:25 p.m. to Dr. Devoria Albe, who verbally acknowledged these results.   Original Report Authenticated By: Marin Roberts, M.D.     Microbiology: Recent Results (from the past 240 hour(s))  URINE CULTURE     Status: None   Collection Time    05/08/12  1:30 PM      Result Value Range Status   Specimen Description URINE, CLEAN CATCH   Final   Special Requests NONE   Final   Culture  Setup Time 05/08/2012 22:15   Final   Colony Count >=100,000 COLONIES/ML   Final   Culture ENTEROCOCCUS SPECIES   Final   Report Status 05/10/2012 FINAL   Final   Organism ID, Bacteria ENTEROCOCCUS SPECIES   Final  MRSA PCR SCREENING     Status: None   Collection Time    05/08/12  5:58 PM      Result Value Range Status   MRSA by PCR NEGATIVE  NEGATIVE Final  Comment:            The GeneXpert MRSA Assay (FDA     approved for NASAL  specimens     only), is one component of a     comprehensive MRSA colonization     surveillance program. It is not     intended to diagnose MRSA     infection nor to guide or     monitor treatment for     MRSA infections.     Labs: Basic Metabolic Panel:  Recent Labs Lab 05/07/12 1500 05/08/12 1248 05/10/12 0505 05/11/12 0435  NA 141 138 141 139  K 4.1 4.1 3.9 4.3  CL 104 102 104 104  CO2 29 27 27 26   GLUCOSE 108* 106* 99 104*  BUN 29* 26* 31* 31*  CREATININE 1.7* 1.51* 1.40* 1.44*  CALCIUM 8.9 8.9 9.0 8.8   Liver Function Tests:  Recent Labs Lab 05/07/12 1500 05/08/12 1248  AST 20 19  ALT 17 15  ALKPHOS 53 58  BILITOT 0.4 0.3  PROT 6.4 6.4  ALBUMIN 3.3* 3.2*   No results found for this basename: LIPASE, AMYLASE,  in the last 168 hours No results found for this basename: AMMONIA,  in the last 168 hours CBC:  Recent Labs Lab 05/07/12 1500 05/08/12 1248 05/10/12 0505 05/11/12 0435  WBC 6.8 6.8 8.0 7.9  NEUTROABS 4.4 4.6  --   --   HGB 14.0 14.0 14.2 13.8  HCT 41.3 40.7 42.3 41.2  MCV 94.7 93.8 94.0 94.1  PLT 206.0 189 200 180   Cardiac Enzymes:  Recent Labs Lab 05/08/12 1248  TROPONINI <0.30   BNP: BNP (last 3 results) No results found for this basename: PROBNP,  in the last 8760 hours CBG: No results found for this basename: GLUCAP,  in the last 168 hours     Signed:  THOMPSON,DANIEL  [facility] 05/11/2012, 11:00 AM

## 2012-05-29 ENCOUNTER — Non-Acute Institutional Stay (SKILLED_NURSING_FACILITY): Payer: Medicare Other | Admitting: Adult Health

## 2012-05-29 ENCOUNTER — Encounter: Payer: Self-pay | Admitting: Adult Health

## 2012-05-29 DIAGNOSIS — I6200 Nontraumatic subdural hemorrhage, unspecified: Secondary | ICD-10-CM

## 2012-05-29 DIAGNOSIS — J309 Allergic rhinitis, unspecified: Secondary | ICD-10-CM

## 2012-05-29 DIAGNOSIS — G4700 Insomnia, unspecified: Secondary | ICD-10-CM

## 2012-05-29 DIAGNOSIS — K5900 Constipation, unspecified: Secondary | ICD-10-CM

## 2012-05-29 DIAGNOSIS — J449 Chronic obstructive pulmonary disease, unspecified: Secondary | ICD-10-CM

## 2012-05-29 DIAGNOSIS — F329 Major depressive disorder, single episode, unspecified: Secondary | ICD-10-CM

## 2012-05-29 DIAGNOSIS — I10 Essential (primary) hypertension: Secondary | ICD-10-CM

## 2012-05-29 DIAGNOSIS — R269 Unspecified abnormalities of gait and mobility: Secondary | ICD-10-CM

## 2012-05-29 DIAGNOSIS — S065X9A Traumatic subdural hemorrhage with loss of consciousness of unspecified duration, initial encounter: Secondary | ICD-10-CM

## 2012-05-29 MED ORDER — ESCITALOPRAM OXALATE 10 MG PO TABS: 10.0000 mg | ORAL_TABLET | Freq: Every day | ORAL | Status: DC

## 2012-05-29 NOTE — Progress Notes (Signed)
Subjective:     Patient ID: Thomas [name], male   DOB: [birth year], 79 y.o.   MRN: [id number]  HPI This is a 79 year old Caucasian male who is for discharge home with Home health PT, OT, CNA and Nursing. He has been admitted to [facility] on 05/11/12 from [facility] with principal discharge diagnosis of subdural hematoma felt to be secondary to mechanical fall at home. Neurosurgery feels no neurosurgical intervention needed at this time. He has been admitted for a short-term rehabilitation.  Family reports patient not wanting to do anything at home when he is by himself. [name] is requesting patient to have a medication that will help with his mood.  Review of Systems  Constitutional: Negative for activity change.  HENT: Negative.   Eyes: Negative.   Respiratory: Negative for cough and shortness of breath.   Neurological: Negative for dizziness.  Hematological: Negative for adenopathy.  Psychiatric/Behavioral: Positive for behavioral problems and confusion.       Objective:   Physical Exam  Constitutional: He appears well-developed. No distress.  HENT:  Head: Normocephalic.  Right Ear: External ear normal.  Left Ear: External ear normal.  Eyes: Conjunctivae are normal. Pupils are equal, round, and reactive to light.  Neck: Normal range of motion. Neck supple. No thyromegaly present.  Cardiovascular: Normal rate, normal heart sounds and intact distal pulses.   No murmur heard. Pulmonary/Chest: Effort normal and breath sounds normal. No respiratory distress.  Abdominal: Soft. Bowel sounds are normal.  Musculoskeletal: Normal range of motion. He exhibits no edema.  Lymphadenopathy:    He has no cervical adenopathy.  Neurological: He is alert.  Skin: Skin is warm. He is not diaphoretic.  Psychiatric: His behavior is normal.  Sad affect       Assessment:  Depression     Plan:     Start Lexapro 10 mg 1 PO Q D

## 2012-06-11 ENCOUNTER — Telehealth: Payer: Self-pay | Admitting: Family Medicine

## 2012-06-11 NOTE — Telephone Encounter (Signed)
Pt was discharged from [facility] on 06-08-12. Pt is having skilled nursing for med management for twice a week for 2 wk and once a week for 1 wk and physical therapy  at home.

## 2012-06-12 DIAGNOSIS — I219 Acute myocardial infarction, unspecified: Secondary | ICD-10-CM

## 2012-06-12 HISTORY — DX: Acute myocardial infarction, unspecified: I21.9

## 2012-06-15 ENCOUNTER — Telehealth: Payer: Self-pay | Admitting: Family Medicine

## 2012-06-15 NOTE — Telephone Encounter (Signed)
Okay to give verbal order per Dr. Clent Ridges.

## 2012-06-15 NOTE — Telephone Encounter (Signed)
Thomas [name]  needs verbal ok to start pt's phy therapy for balance, walking, and transferring. Pls call.

## 2012-06-18 NOTE — Telephone Encounter (Signed)
I left voice message with verbal order to the below request.

## 2012-06-19 ENCOUNTER — Telehealth: Payer: Self-pay | Admitting: Family Medicine

## 2012-06-19 DIAGNOSIS — F329 Major depressive disorder, single episode, unspecified: Secondary | ICD-10-CM

## 2012-06-19 NOTE — Telephone Encounter (Signed)
Pt's wife called and left voice message. He needs scripts sent in to [facility]. I need to call pt back and ask which ones?

## 2012-06-20 ENCOUNTER — Encounter (HOSPITAL_COMMUNITY): Payer: Self-pay | Admitting: Emergency Medicine

## 2012-06-20 ENCOUNTER — Telehealth: Payer: Self-pay | Admitting: Family Medicine

## 2012-06-20 ENCOUNTER — Encounter (HOSPITAL_COMMUNITY)
Admission: EM | Disposition: A | Discharge status: Skilled Nursing Facility | Payer: Self-pay | Source: Home / Self Care | Attending: Cardiovascular Disease

## 2012-06-20 ENCOUNTER — Inpatient Hospital Stay (HOSPITAL_COMMUNITY)
Admission: EM | Admit: 2012-06-20 | Discharge: 2012-06-26 | DRG: 248 | Disposition: A | Discharge status: Skilled Nursing Facility | Payer: Medicare Other | Attending: Cardiovascular Disease | Admitting: Cardiovascular Disease

## 2012-06-20 DIAGNOSIS — I2119 ST elevation (STEMI) myocardial infarction involving other coronary artery of inferior wall: Principal | ICD-10-CM

## 2012-06-20 DIAGNOSIS — I213 ST elevation (STEMI) myocardial infarction of unspecified site: Secondary | ICD-10-CM

## 2012-06-20 DIAGNOSIS — J4489 Other specified chronic obstructive pulmonary disease: Secondary | ICD-10-CM | POA: Diagnosis present

## 2012-06-20 DIAGNOSIS — I4729 Other ventricular tachycardia: Secondary | ICD-10-CM | POA: Diagnosis not present

## 2012-06-20 DIAGNOSIS — I350 Nonrheumatic aortic (valve) stenosis: Secondary | ICD-10-CM | POA: Diagnosis present

## 2012-06-20 DIAGNOSIS — Z8249 Family history of ischemic heart disease and other diseases of the circulatory system: Secondary | ICD-10-CM

## 2012-06-20 DIAGNOSIS — F3289 Other specified depressive episodes: Secondary | ICD-10-CM | POA: Diagnosis present

## 2012-06-20 DIAGNOSIS — R0902 Hypoxemia: Secondary | ICD-10-CM | POA: Diagnosis not present

## 2012-06-20 DIAGNOSIS — Z87891 Personal history of nicotine dependence: Secondary | ICD-10-CM

## 2012-06-20 DIAGNOSIS — E785 Hyperlipidemia, unspecified: Secondary | ICD-10-CM | POA: Diagnosis present

## 2012-06-20 DIAGNOSIS — I959 Hypotension, unspecified: Secondary | ICD-10-CM | POA: Diagnosis not present

## 2012-06-20 DIAGNOSIS — Z8042 Family history of malignant neoplasm of prostate: Secondary | ICD-10-CM

## 2012-06-20 DIAGNOSIS — R5381 Other malaise: Secondary | ICD-10-CM

## 2012-06-20 DIAGNOSIS — M1990 Unspecified osteoarthritis, unspecified site: Secondary | ICD-10-CM | POA: Diagnosis present

## 2012-06-20 DIAGNOSIS — I9589 Other hypotension: Secondary | ICD-10-CM | POA: Diagnosis not present

## 2012-06-20 DIAGNOSIS — K5909 Other constipation: Secondary | ICD-10-CM | POA: Diagnosis present

## 2012-06-20 DIAGNOSIS — E538 Deficiency of other specified B group vitamins: Secondary | ICD-10-CM | POA: Diagnosis present

## 2012-06-20 DIAGNOSIS — I5031 Acute diastolic (congestive) heart failure: Secondary | ICD-10-CM

## 2012-06-20 DIAGNOSIS — I359 Nonrheumatic aortic valve disorder, unspecified: Secondary | ICD-10-CM

## 2012-06-20 DIAGNOSIS — I472 Ventricular tachycardia, unspecified: Secondary | ICD-10-CM | POA: Diagnosis not present

## 2012-06-20 DIAGNOSIS — Z9861 Coronary angioplasty status: Secondary | ICD-10-CM

## 2012-06-20 DIAGNOSIS — S065X9A Traumatic subdural hemorrhage with loss of consciousness of unspecified duration, initial encounter: Secondary | ICD-10-CM | POA: Diagnosis present

## 2012-06-20 DIAGNOSIS — N183 Chronic kidney disease, stage 3 unspecified: Secondary | ICD-10-CM

## 2012-06-20 DIAGNOSIS — R001 Bradycardia, unspecified: Secondary | ICD-10-CM

## 2012-06-20 DIAGNOSIS — Z8551 Personal history of malignant neoplasm of bladder: Secondary | ICD-10-CM

## 2012-06-20 DIAGNOSIS — I2510 Atherosclerotic heart disease of native coronary artery without angina pectoris: Secondary | ICD-10-CM | POA: Diagnosis present

## 2012-06-20 DIAGNOSIS — I2589 Other forms of chronic ischemic heart disease: Secondary | ICD-10-CM | POA: Diagnosis present

## 2012-06-20 DIAGNOSIS — K222 Esophageal obstruction: Secondary | ICD-10-CM | POA: Diagnosis present

## 2012-06-20 DIAGNOSIS — I2582 Chronic total occlusion of coronary artery: Secondary | ICD-10-CM | POA: Diagnosis present

## 2012-06-20 DIAGNOSIS — R413 Other amnesia: Secondary | ICD-10-CM | POA: Diagnosis present

## 2012-06-20 DIAGNOSIS — J449 Chronic obstructive pulmonary disease, unspecified: Secondary | ICD-10-CM | POA: Diagnosis present

## 2012-06-20 DIAGNOSIS — I129 Hypertensive chronic kidney disease with stage 1 through stage 4 chronic kidney disease, or unspecified chronic kidney disease: Secondary | ICD-10-CM | POA: Diagnosis present

## 2012-06-20 DIAGNOSIS — K219 Gastro-esophageal reflux disease without esophagitis: Secondary | ICD-10-CM | POA: Diagnosis present

## 2012-06-20 DIAGNOSIS — I509 Heart failure, unspecified: Secondary | ICD-10-CM | POA: Diagnosis not present

## 2012-06-20 DIAGNOSIS — N400 Enlarged prostate without lower urinary tract symptoms: Secondary | ICD-10-CM | POA: Diagnosis present

## 2012-06-20 DIAGNOSIS — I252 Old myocardial infarction: Secondary | ICD-10-CM

## 2012-06-20 DIAGNOSIS — S065XAA Traumatic subdural hemorrhage with loss of consciousness status unknown, initial encounter: Secondary | ICD-10-CM | POA: Diagnosis present

## 2012-06-20 DIAGNOSIS — F329 Major depressive disorder, single episode, unspecified: Secondary | ICD-10-CM | POA: Diagnosis present

## 2012-06-20 DIAGNOSIS — D72829 Elevated white blood cell count, unspecified: Secondary | ICD-10-CM | POA: Diagnosis not present

## 2012-06-20 HISTORY — PX: LEFT HEART CATHETERIZATION WITH CORONARY ANGIOGRAM: SHX5451

## 2012-06-20 HISTORY — PX: PERCUTANEOUS CORONARY STENT INTERVENTION (PCI-S): SHX5485

## 2012-06-20 HISTORY — PX: LEFT HEART CATH: SHX5478

## 2012-06-20 LAB — CBC WITH DIFFERENTIAL/PLATELET
Basophils Absolute: 0 10*3/uL (ref 0.0–0.1)
Eosinophils Relative: 0 % (ref 0–5)
HCT: 38.5 % — ABNORMAL LOW (ref 39.0–52.0)
Lymphocytes Relative: 15 % (ref 12–46)
Lymphs Abs: 1.9 10*3/uL (ref 0.7–4.0)
MCV: 92.1 fL (ref 78.0–100.0)
Monocytes Absolute: 1.2 10*3/uL — ABNORMAL HIGH (ref 0.1–1.0)
Monocytes Relative: 9 % (ref 3–12)
RDW: 12.9 % (ref 11.5–15.5)
WBC: 12.5 10*3/uL — ABNORMAL HIGH (ref 4.0–10.5)

## 2012-06-20 LAB — POCT I-STAT, CHEM 8
Calcium, Ion: 1.14 mmol/L (ref 1.13–1.30)
Chloride: 104 mEq/L (ref 96–112)
HCT: 43 % (ref 39.0–52.0)
Potassium: 3.9 mEq/L (ref 3.5–5.1)
Sodium: 139 mEq/L (ref 135–145)

## 2012-06-20 LAB — POCT I-STAT TROPONIN I: Troponin i, poc: 0.41 ng/mL (ref 0.00–0.08)

## 2012-06-20 LAB — BASIC METABOLIC PANEL
BUN: 23 mg/dL (ref 6–23)
CO2: 20 mEq/L (ref 19–32)
Calcium: 8.5 mg/dL (ref 8.4–10.5)
Creatinine, Ser: 1.38 mg/dL — ABNORMAL HIGH (ref 0.50–1.35)
Glucose, Bld: 96 mg/dL (ref 70–99)

## 2012-06-20 LAB — MRSA PCR SCREENING: MRSA by PCR: NEGATIVE

## 2012-06-20 SURGERY — LEFT HEART CATHETERIZATION WITH CORONARY ANGIOGRAM
Anesthesia: LOCAL | Site: Hand | Laterality: Right

## 2012-06-20 MED ORDER — VERAPAMIL HCL 2.5 MG/ML IV SOLN
INTRAVENOUS | Status: AC
  Filled 2012-06-20: qty 2

## 2012-06-20 MED ORDER — CLOPIDOGREL BISULFATE 75 MG PO TABS
75.0000 mg | ORAL_TABLET | Freq: Every day | ORAL | Status: DC
  Administered 2012-06-21 – 2012-06-26 (×6): 75 mg via ORAL
  Filled 2012-06-20 (×7): qty 1

## 2012-06-20 MED ORDER — LATANOPROST 0.005 % OP SOLN
1.0000 [drp] | Freq: Every day | OPHTHALMIC | Status: DC
  Administered 2012-06-20 – 2012-06-25 (×5): 1 [drp] via OPHTHALMIC
  Filled 2012-06-20 (×2): qty 2.5

## 2012-06-20 MED ORDER — METOPROLOL TARTRATE 25 MG PO TABS
25.0000 mg | ORAL_TABLET | Freq: Two times a day (BID) | ORAL | Status: DC
  Administered 2012-06-20 – 2012-06-22 (×5): 25 mg via ORAL
  Filled 2012-06-20 (×7): qty 1

## 2012-06-20 MED ORDER — HEPARIN (PORCINE) IN NACL 2-0.9 UNIT/ML-% IJ SOLN
INTRAMUSCULAR | Status: AC
  Filled 2012-06-20: qty 1000

## 2012-06-20 MED ORDER — HEPARIN SODIUM (PORCINE) 1000 UNIT/ML IJ SOLN
INTRAMUSCULAR | Status: AC
  Filled 2012-06-20: qty 1

## 2012-06-20 MED ORDER — NITROGLYCERIN 0.4 MG SL SUBL: 0.4000 mg | SUBLINGUAL_TABLET | SUBLINGUAL | Status: DC | PRN

## 2012-06-20 MED ORDER — TEMAZEPAM 15 MG PO CAPS
30.0000 mg | ORAL_CAPSULE | Freq: Every evening | ORAL | Status: DC | PRN
  Administered 2012-06-20 – 2012-06-25 (×5): 30 mg via ORAL
  Filled 2012-06-20 (×6): qty 2

## 2012-06-20 MED ORDER — SENNOSIDES-DOCUSATE SODIUM 8.6-50 MG PO TABS
1.0000 | ORAL_TABLET | Freq: Two times a day (BID) | ORAL | Status: DC
  Administered 2012-06-20 – 2012-06-25 (×10): 1 via ORAL
  Filled 2012-06-20 (×13): qty 1

## 2012-06-20 MED ORDER — NITROGLYCERIN 1 MG/10 ML FOR IR/CATH LAB
INTRA_ARTERIAL | Status: AC
  Filled 2012-06-20: qty 10

## 2012-06-20 MED ORDER — ATORVASTATIN CALCIUM 40 MG PO TABS
40.0000 mg | ORAL_TABLET | Freq: Every day | ORAL | Status: DC
  Administered 2012-06-21 – 2012-06-26 (×5): 40 mg via ORAL
  Filled 2012-06-20 (×6): qty 1

## 2012-06-20 MED ORDER — ASPIRIN 81 MG PO CHEW
81.0000 mg | CHEWABLE_TABLET | Freq: Every day | ORAL | Status: DC
  Administered 2012-06-21 – 2012-06-26 (×6): 81 mg via ORAL
  Filled 2012-06-20 (×6): qty 1

## 2012-06-20 MED ORDER — SODIUM CHLORIDE 0.9 % IV SOLN: INTRAVENOUS | Status: DC

## 2012-06-20 MED ORDER — ACETAMINOPHEN 325 MG PO TABS: 650.0000 mg | ORAL_TABLET | ORAL | Status: DC | PRN

## 2012-06-20 MED ORDER — LIDOCAINE HCL (PF) 1 % IJ SOLN
INTRAMUSCULAR | Status: AC
  Filled 2012-06-20: qty 30

## 2012-06-20 MED ORDER — ADULT MULTIVITAMIN W/MINERALS CH
1.0000 | ORAL_TABLET | Freq: Every day | ORAL | Status: DC
  Administered 2012-06-21 – 2012-06-26 (×6): 1 via ORAL
  Filled 2012-06-20 (×7): qty 1

## 2012-06-20 MED ORDER — ENSURE COMPLETE PO LIQD
120.0000 mL | Freq: Two times a day (BID) | ORAL | Status: DC
  Administered 2012-06-21 – 2012-06-26 (×7): 120 mL via ORAL

## 2012-06-20 MED ORDER — LISINOPRIL 10 MG PO TABS
10.0000 mg | ORAL_TABLET | Freq: Every day | ORAL | Status: DC
  Filled 2012-06-20: qty 1

## 2012-06-20 MED ORDER — TRAMADOL HCL 50 MG PO TABS
50.0000 mg | ORAL_TABLET | Freq: Four times a day (QID) | ORAL | Status: DC | PRN
  Filled 2012-06-20: qty 1

## 2012-06-20 MED ORDER — FLUTICASONE PROPIONATE 50 MCG/ACT NA SUSP
2.0000 | Freq: Every day | NASAL | Status: DC
  Administered 2012-06-21 – 2012-06-26 (×6): 2 via NASAL
  Filled 2012-06-20: qty 16

## 2012-06-20 MED ORDER — MOMETASONE FURO-FORMOTEROL FUM 100-5 MCG/ACT IN AERO
2.0000 | INHALATION_SPRAY | Freq: Two times a day (BID) | RESPIRATORY_TRACT | Status: DC
  Administered 2012-06-20 – 2012-06-26 (×12): 2 via RESPIRATORY_TRACT
  Filled 2012-06-20: qty 8.8

## 2012-06-20 MED ORDER — ESCITALOPRAM OXALATE 10 MG PO TABS
10.0000 mg | ORAL_TABLET | Freq: Every day | ORAL | Status: DC
  Administered 2012-06-21 – 2012-06-26 (×6): 10 mg via ORAL
  Filled 2012-06-20 (×7): qty 1

## 2012-06-20 MED ORDER — CLOPIDOGREL BISULFATE 300 MG PO TABS
ORAL_TABLET | ORAL | Status: AC
  Filled 2012-06-20: qty 2

## 2012-06-20 NOTE — Telephone Encounter (Signed)
Pt needs Lisinopril 10 mg take 1 po qd ( this was given in hospital ), Lexapro 10 mg and Cardizem CD 180 mg. Pt already got the Cardizem in the mail. Can I send the other scripts in?

## 2012-06-20 NOTE — ED Notes (Addendum)
DR Bebe Shaggy NOTIFIED OF STEMI AND AT BEDSIDE ASSESSING PT and VERIFIES STEMI AND NEED FOR TRANSFER TO CATH LAB.

## 2012-06-20 NOTE — ED Notes (Signed)
CARDIOLOGY AT BEDSIDE AND STATES PT NEEDS TRANSFER TO CATH LAB NOW

## 2012-06-20 NOTE — CV Procedure (Signed)
Cardiac Catheterization Procedure Note  Name: Thomas [name] MRN: [id number] DOB: [birth year]  Procedure: Selective Coronary Angiography,  PTCA and stenting of the mid right coronary artery. Indication: Acute inferior ST elevation myocardial infarction.  Medications:   Contrast:  75 ml  Omnipaque  Procedural Details: The right wrist was prepped, draped, and anesthetized with 1% lidocaine. Using the modified Seldinger technique, a 6 French sheath was introduced into the right radial artery. 3 mg of verapamil was administered through the sheath, weight-based unfractionated heparin was administered intravenously. I decided to proceed with the right coronary intervention before the diagnostic procedure given the clear inferior ST changes. At the end of the right coronary intervention, I continued the diagnostic catheterization with a JL 3.5. I attempted to cross the aortic valve with a pigtail catheter but was not successful due to calcified aortic valve.Catheter exchanges were performed over an exchange length guidewire. There were no immediate procedural complications.  Procedural Findings:  Hemodynamics: AO:  112/53   mmHg  Coronary angiography: Coronary dominance: Right   Left Main:  Normal in size with 30% distal stenosis.  Left Anterior Descending (LAD):  Normal in size and mildly calcified. There is 40% tubular proximal stenosis. The rest of the vessel has minor irregularities with no obstructive disease.  1st diagonal (D1):  Small in size with diffuse 50% disease.  2nd diagonal (D2):  Normal in size with 50% proximal disease.  3rd diagonal (D3):  Very small in size.  Circumflex (LCx):  The vessel is occluded proximally with left to left collaterals noted. This appears to be a chronic occlusion.  Right Coronary Artery: Very large in size and dominant. The vessel is overall moderately calcified. There is 30% tubular stenosis proximally. In the midsegment, there is a 99%  thrombotic stenosis which is the culprit for inferior ST elevation myocardial infarction. There is TIMI 2 flow in the vessel.  posterior descending artery: Very large in size with 40% ostial stenosis.  posterior lateral branch: PL 1 is Medium in size with minor irregularities. PL 2 is large in size with 70-80% disease in the midsegment.                 PCI Note: Due to history of subdural hematoma, I elected to do the case with heparin only and gave only 3500 units. ACT was 247. a 6 [place] JR 4 guide catheter was inserted.  A intuition coronary guidewire was used to cross the lesion.  The lesion was predilated with a 2.5 x 12 balloon.  The lesion was then stented with a 3.5 x 15 integrity bare-metal  stent.  Following PCI, there was 0% residual stenosis and TIMI-3 flow. Final angiography confirmed an excellent result with a step up on a step down. The patient tolerated the procedure well. There were no immediate procedural complications. A TR band was used for radial hemostasis. The patient was transferred to the post catheterization recovery area for further monitoring.  PCI Data: Vessel - mid RCA/Segment - 2 Percent Stenosis (pre)  99% TIMI-flow 2 Stent 3.5 x 15 mm integrity bare-metal stent Percent Stenosis (post) 0% TIMI-flow (post)  3   Final Conclusions:  1. Inferior ST elevation myocardial infarction due to thrombotic stenosis in the mid RCA. There is underlying significant 2 vessel coronary artery disease with chronically occluded left circumflex as well. 2. Successful angioplasty and bare-metal stent placement to the mid RCA.   Recommendations:  Will obtain an echocardiogram to evaluate LV systolic function and possibility  of aortic stenosis. Continue dual antiplatelet therapy for one month. We'll initiate post MI care.  Lorine Bears MD, [facility] 06/20/2012, 5:22 PM

## 2012-06-20 NOTE — ED Notes (Signed)
Per EMS: pt chest pain burning started at 3 pm today; pt given 324 baby ASA, 2 nitro, 4 mg morphine, 4 mg zofran; pt down to 3/10; pt currently denies pain; pt alert and mentating appropriately. Pt from home. BP 124/80 Pulse 80 RR 18 91 % room air placed on 4L [place] up to 97%. PT has had 4 nitro total after taking 2 home nitro before EMS arrival. Pt tried inhaler without relief.

## 2012-06-20 NOTE — H&P (Signed)
    I have seen and examined the patient. I agree with the above note with the addition of : Chest pain started around noon today. ECG showed significant inferior STE. Given his recent history of subdural hematoma, I called Dr. Gerlene Fee from neurosurgery who saw the patient during recent admission. He was not impressed with CT head findings from that admission. The patient with no neurological symptoms. After discussion, it was felt that the benefit of PCI outweighs the risk of bleeding.  I proceeded with plans to use Heparin only . See cath note for more details.   Lorine Bears MD, [facility] 06/20/2012 5:59 PM

## 2012-06-20 NOTE — ED Notes (Signed)
PT TRANSFERRED TO CATH LAB WITH LINDSAY, RN and CARDIOLOGY

## 2012-06-20 NOTE — ED Notes (Signed)
Cardiology and ED MD Bebe Shaggy) assessed pt at 1615 and decided pt needed to be transferred to cath lab immediately. Pt taken up on Zoll monitor and pads to cath lab via Lillia Abed, RN and cardiology. Pt alert and mentating appropriately upon arrival and transfer. Dr Bebe Shaggy did not have additional EKG printed due to having EKG from EMS in hand.

## 2012-06-20 NOTE — H&P (Signed)
History and Physical   Patient ID: Thomas [name] MRN: [id number], DOB/AGE: 1916-05-11   Admit date: 06/20/2012 Date of Consult: 06/20/2012   Primary Physician: Nelwyn Salisbury, MD Primary Cardiologist: T. Wall, MD  HPI: Thomas [name] is a 79 y.o. male with multiple medical issues including CAD (s/p PCI in 1995) who was transported to [facility] ED today with inferior STEMI.   Medical history includes CKD (stage III), COPD, mild AS, dyslipidemia, HTN, carotid artery disease, neuropathy, BPH (s/p TERP) and GERD. Last cardiac catheterization in 2005 revealed- 60% distal left main, 60-70% ostial LCx, 60-70% prox-mid LCx, 30% prox RCA, 30% mid RCA, 30% ostial PDA, 30% mid PDA, 30% mid PLB. EF 65%. He has been managed with medical therapy. He was admitted in 04/2012 for a subdural hematoma due to a mechanical fall without neurological deficits which was managed conservatively.   He was in his USOH- ambulates with walker at home with wife- until 3 PM today. He layed down to take a nap and experienced severe 8/10 anterior chest burning without radiation with associated shortness of breath and nausea. He used his inhaled corticosteroid without improvement. NTG SL x 2 eased the pain to a 6/10. EMS was called and an EKG in the field demonstrated inferior STEMI. He received 4 low-dose ASA, 2 NTG SL and MSO4 4mg  x 1 with pain improvement to 0/10.   On evaluation in the ED, he was awake, alert and pain free. EKG review confirmed inferior ST elevations and code STEMI was called. Trop-I 0.41, Cr 1.5, H/H WNL. He was transported emergently to the cardiac catheterization lab. The patient's family was initially unavailable at that time. The patient was informed of the circumstances and plan to pursue cardiac catheterization. He was given the option to discuss with family, but elected to proceed. He is currently undergoing the procedure.   Problem List: Past Medical History  Diagnosis Date  . Mild aortic stenosis      last echo 08/2006  . Coronary artery disease     s/p PCI in 1995; Cath 2005 showed moderate 2 vessel CAD involving the distal Left main & LCx/OM, treated medically  . Myocardial infarction   . Hyperlipidemia   . Hypertension   . Vitamin B12 deficiency   . Neuropathy   . Allergic rhinitis   . OA (osteoarthritis)   . History of bladder cancer   . GERD (gastroesophageal reflux disease)   . Esophageal stricture   . Chronic kidney disease     over active bladder  . BPH (benign prostatic hypertrophy)     URINARY RETENTION-HAS INDWELLING FOLEY CATHETER  . Memory loss     short term memory loss which pt denies    Past Surgical History  Procedure Laterality Date  . Coronary angioplasty    . Appendectomy    . Cholecystectomy    . Tonsillectomy    . Sigmoidoscopy  1999  . Colonoscopy    . Bilateral elbow surgery    . Left knee replacement    . Carotid endarterectomy  08/2008  . Esophagogastroduodenoscopy      dilation  . Transurethral resection of prostate N/A 04/25/2012    Procedure: TRANSURETHRAL RESECTION OF THE PROSTATE WITH GYRUS INSTRUMENTS;  Surgeon: Sebastian Ache, MD;  Location: [facility];  Service: Urology;  Laterality: N/A;  . Cystoscopy with biopsy N/A 04/25/2012    Procedure: CYSTOSCOPY WITH BIOPSY;  Surgeon: Sebastian Ache, MD;  Location: [facility];  Service: Urology;  Laterality: N/A;  Allergies:  Allergies  Allergen Reactions  . Sulfamethoxazole     REACTION: unspecified    Home Medications: Prior to Admission medications   Medication Sig Start Date End Date Taking? Authorizing Provider  diltiazem (DILACOR XR) 180 MG 24 hr capsule Take 180 mg by mouth every morning.    Historical Provider, MD  escitalopram (LEXAPRO) 10 MG tablet Take 1 tablet (10 mg total) by mouth daily. 05/29/12   Monina C Medina-Vargas, NP  feeding supplement (ENSURE COMPLETE) LIQD Take 120 mLs by mouth 2 (two) times daily between meals. 05/11/12   Rodolph Bong, MD  fluticasone [facility]) 50  MCG/ACT nasal spray Place 2 sprays into the nose daily. 03/22/12 03/22/13  Nelwyn Salisbury, MD  latanoprost (XALATAN) 0.005 % ophthalmic solution Place 1 drop into both eyes at bedtime.    Historical Provider, MD  mometasone-formoterol (DULERA) 100-5 MCG/ACT AERO Inhale 2 puffs into the lungs 2 (two) times daily. 12/23/11   Nelwyn Salisbury, MD  Multiple Vitamin (MULTIVITAMIN WITH MINERALS) TABS Take 1 tablet by mouth daily.    Historical Provider, MD  nitroGLYCERIN (NITROSTAT) 0.4 MG SL tablet Place 0.4 mg under the tongue every 5 (five) minutes as needed. For chest pain    Historical Provider, MD  sennosides-docusate sodium (SENOKOT-S) 8.6-50 MG tablet Take 1 tablet by mouth 2 (two) times daily. While taking pain meds to prevent constipation. 04/26/12   Sebastian Ache, MD  temazepam (RESTORIL) 30 MG capsule Take 1 capsule (30 mg total) by mouth at bedtime as needed for sleep. 04/03/12   Nelwyn Salisbury, MD  traMADol (ULTRAM) 50 MG tablet Take 1 tablet (50 mg total) by mouth every 6 (six) hours as needed. 05/11/12   Rodolph Bong, MD  trandolapril (MAVIK) 4 MG tablet Take 1 tablet (4 mg total) by mouth daily. 04/24/12   Nelwyn Salisbury, MD    Inpatient Medications:   Prescriptions prior to admission  Medication Sig Dispense Refill  . diltiazem (DILACOR XR) 180 MG 24 hr capsule Take 180 mg by mouth every morning.      . escitalopram (LEXAPRO) 10 MG tablet Take 1 tablet (10 mg total) by mouth daily.      . feeding supplement (ENSURE COMPLETE) LIQD Take 120 mLs by mouth 2 (two) times daily between meals.      . fluticasone (FLONASE) 50 MCG/ACT nasal spray Place 2 sprays into the nose daily.  16 g  11  . latanoprost (XALATAN) 0.005 % ophthalmic solution Place 1 drop into both eyes at bedtime.      . mometasone-formoterol (DULERA) 100-5 MCG/ACT AERO Inhale 2 puffs into the lungs 2 (two) times daily.  1 Inhaler  11  . Multiple Vitamin (MULTIVITAMIN WITH MINERALS) TABS Take 1 tablet by mouth daily.      .  nitroGLYCERIN (NITROSTAT) 0.4 MG SL tablet Place 0.4 mg under the tongue every 5 (five) minutes as needed. For chest pain      . sennosides-docusate sodium (SENOKOT-S) 8.6-50 MG tablet Take 1 tablet by mouth 2 (two) times daily. While taking pain meds to prevent constipation.  30 tablet  0  . temazepam (RESTORIL) 30 MG capsule Take 1 capsule (30 mg total) by mouth at bedtime as needed for sleep.  30 capsule  5  . traMADol (ULTRAM) 50 MG tablet Take 1 tablet (50 mg total) by mouth every 6 (six) hours as needed.  15 tablet  0  . trandolapril (MAVIK) 4 MG tablet Take 1 tablet (4  mg total) by mouth daily.  30 tablet  3    Family History  Problem Relation Age of Onset  . Coronary artery disease Brother   . Prostate cancer      first degree relative     History   Social History  . Marital Status: Married    Spouse Name: N/A    Number of Children: N/A  . Years of Education: N/A   Occupational History  . Not on file.   Social History Main Topics  . Smoking status: Former Smoker -- 1.00 packs/day for 10 years    Quit date: 03/14/1950  . Smokeless tobacco: Never Used  . Alcohol Use: No  . Drug Use: No  . Sexually Active: No   Other Topics Concern  . 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 Denies leg swelling, syncope or palpitations.   Physical Exam: Blood pressure 107/61.    General: Elderly, thin, in no acute distress. Head: Normocephalic, atraumatic, sclera non-icteric, no xanthomas, nares are without discharge. Neck: Negative for carotid bruits. JVD not elevated. Lungs: Clear bilaterally to auscultation without wheezes, rales, or rhonchi. Breathing is unlabored. Heart: RRR with II/VI systolic crescendo-decrescendo murmur at RUSB, S1 S2. No rubs or gallops appreciated. Abdomen: Soft, non-tender, non-distended  with normoactive bowel sounds. No hepatomegaly. No rebound/guarding. No obvious abdominal masses. Msk:  Strength and tone appears normal for age. Extremities: No clubbing, cyanosis or edema.  Distal pedal pulses are 2+ and equal bilaterally. Neuro: Alert and oriented X 3. Moves all extremities spontaneously. Psych:  Responds to questions appropriately with a normal affect.  Labs: Recent Labs     06/20/12  1624  HGB  14.6  HCT  43.0   Recent Labs Lab 06/20/12 1624  NA 139  K 3.9  CL 104  BUN 26*  CREATININE 1.50*  GLUCOSE 127*    Radiology/Studies: No results found.  EKG: NSR, ST elevations (2-3 mm) II, III, aVF, ST depressions V1-V4  ASSESSMENT AND PLAN:   79 y.o. male with multiple medical issues including CAD (s/p PCI in 1995) who was transported to [facility] ED today with inferior STEMI.   1. Inferior STEMI 2. CAD s/p PCI 3. CKD, stage III  4. Dyslipidemia 5. Hypertension 6. Carotid artery disease 7. Recent h/o subdural hematoma 8. COPD 9. GERD  The patient is currently undergoing emergent cardiac catheterization. ASA was held in the setting of prior subdural hematoma.  Will plan for admission to CCU, cycle CEs, obtain CXR. Start statin. Continue home medications. Further recommendations to follow pending interventionalist's findings.   Signed, R. Hurman Horn, PA-C 06/20/2012, 4:40 PM

## 2012-06-20 NOTE — Progress Notes (Addendum)
Responded to code stemi and provided Chaplain support during the stent procedure. Kept family informed as appropriate and remained until physician gave final update and pt was moved from the lab to pt room. Informed pt staff of location of family. Wife was initially tearful but very thankful for Chaplain support. Marjory Lies Chaplain

## 2012-06-20 NOTE — Telephone Encounter (Signed)
Please call in such an order

## 2012-06-20 NOTE — Telephone Encounter (Signed)
Yes refill all of them

## 2012-06-20 NOTE — Telephone Encounter (Signed)
Thomas [name] with [facility] called stating that the patient is having difficulty getting in and out of the bath tub and they need a verbal order for OT to train how to get in and out of the tub. Please assist.

## 2012-06-20 NOTE — ED Provider Notes (Signed)
History     CSN: [id number]  Arrival date & time 06/20/12  1614   First MD Initiated Contact with Patient 06/20/12 1618      Chief Complaint  Patient presents with  . Code STEMI    Patient is a 79 y.o. male presenting with chest pain. The history is provided by the EMS personnel and the patient. The history is limited by the condition of the patient.  Chest Pain Pain location:  Substernal area Pain quality: burning   Pain radiates to:  Does not radiate Pain radiates to the back: no   Pain severity:  Moderate Onset quality:  Gradual Duration: 1.5. Timing:  Constant Progression:  Worsening Chronicity:  New Relieved by:  Aspirin and nitroglycerin Worsened by:  Nothing tried Associated symptoms: no fever, no syncope and no weakness     Past Medical History  Diagnosis Date  . Mild aortic stenosis     last echo 08/2006  . Coronary artery disease     s/p PCI in 1995; Cath 2005 showed moderate 2 vessel CAD involving the distal Left main & LCx/OM, treated medically  . Myocardial infarction   . Hyperlipidemia   . Hypertension   . Vitamin B12 deficiency   . Neuropathy   . Allergic rhinitis   . OA (osteoarthritis)   . History of bladder cancer   . GERD (gastroesophageal reflux disease)   . Esophageal stricture   . Chronic kidney disease     over active bladder  . BPH (benign prostatic hypertrophy)     URINARY RETENTION-HAS INDWELLING FOLEY CATHETER  . Memory loss     short term memory loss which pt denies    Past Surgical History  Procedure Laterality Date  . Coronary angioplasty    . Appendectomy    . Cholecystectomy    . Tonsillectomy    . Sigmoidoscopy  1999  . Colonoscopy    . Bilateral elbow surgery    . Left knee replacement    . Carotid endarterectomy  08/2008  . Esophagogastroduodenoscopy      dilation  . Transurethral resection of prostate N/A 04/25/2012    Procedure: TRANSURETHRAL RESECTION OF THE PROSTATE WITH GYRUS INSTRUMENTS;  Surgeon: Sebastian Ache,  MD;  Location: [facility];  Service: Urology;  Laterality: N/A;  . Cystoscopy with biopsy N/A 04/25/2012    Procedure: CYSTOSCOPY WITH BIOPSY;  Surgeon: Sebastian Ache, MD;  Location: [facility];  Service: Urology;  Laterality: N/A;    Family History  Problem Relation Age of Onset  . Coronary artery disease Brother   . Prostate cancer      first degree relative    History  Substance Use Topics  . Smoking status: Former Smoker -- 1.00 packs/day for 10 years    Quit date: 03/14/1950  . Smokeless tobacco: Never Used  . Alcohol Use: No      Review of Systems  Unable to perform ROS: Acuity of condition  Constitutional: Negative for fever.  Cardiovascular: Positive for chest pain. Negative for syncope.  Gastrointestinal: Negative for blood in stool.  Neurological: Negative for weakness.    Allergies  Sulfamethoxazole  Home Medications   Current Outpatient Rx  Name  Route  Sig  Dispense  Refill  . diltiazem (DILACOR XR) 180 MG 24 hr capsule   Oral   Take 180 mg by mouth every morning.         . escitalopram (LEXAPRO) 10 MG tablet   Oral   Take 1 tablet (10 mg  total) by mouth daily.         . feeding supplement (ENSURE COMPLETE) LIQD   Oral   Take 120 mLs by mouth 2 (two) times daily between meals.         . fluticasone (FLONASE) 50 MCG/ACT nasal spray   Nasal   Place 2 sprays into the nose daily.   16 g   11   . latanoprost (XALATAN) 0.005 % ophthalmic solution   Both Eyes   Place 1 drop into both eyes at bedtime.         . mometasone-formoterol (DULERA) 100-5 MCG/ACT AERO   Inhalation   Inhale 2 puffs into the lungs 2 (two) times daily.   1 Inhaler   11   . Multiple Vitamin (MULTIVITAMIN WITH MINERALS) TABS   Oral   Take 1 tablet by mouth daily.         . nitroGLYCERIN (NITROSTAT) 0.4 MG SL tablet   Sublingual   Place 0.4 mg under the tongue every 5 (five) minutes as needed. For chest pain         . sennosides-docusate sodium (SENOKOT-S) 8.6-50 MG  tablet   Oral   Take 1 tablet by mouth 2 (two) times daily. While taking pain meds to prevent constipation.   30 tablet   0   . temazepam (RESTORIL) 30 MG capsule   Oral   Take 1 capsule (30 mg total) by mouth at bedtime as needed for sleep.   30 capsule   5   . traMADol (ULTRAM) 50 MG tablet   Oral   Take 1 tablet (50 mg total) by mouth every 6 (six) hours as needed.   15 tablet   0   . trandolapril (MAVIK) 4 MG tablet   Oral   Take 1 tablet (4 mg total) by mouth daily.   30 tablet   3     BP 107/61  Physical Exam CONSTITUTIONAL: Well developed/well nourished HEAD: Normocephalic/atraumatic EYES: EOMI ENMT: Mucous membranes moist NECK: supple no meningeal signs CV: S1/S2 noted LUNGS: Lungs are clear to auscultation bilaterally, no apparent distress ABDOMEN: soft, nontender, no rebound or guarding GU:no cva tenderness NEURO: Pt is awake/alert, moves all extremitiesx4 EXTREMITIES: pulses normal, full ROM, no calf tenderness SKIN: warm, color normal PSYCH: no abnormalities of mood noted  ED Course  Procedures   Labs Reviewed  POCT I-STAT, CHEM 8 - Abnormal; Notable for the following:    BUN 26 (*)    Creatinine, Ser 1.50 (*)    Glucose, Bld 127 (*)    All other components within normal limits     1. STEMI (ST elevation myocardial infarction)     Pt seen on arrival from EMS He has been given ASA/NTG with some improvement in pain prehospital EKG shows clear inferior wall STEMI Cath lab was ready for patient and I instructed patient to go directly to cath lab with nursing to prevent any further delay in acute intervention Pt stable for transport to cath lab Pt left for cath lab at approximately 1619  MDM  Nursing notes including past medical history and social history reviewed and considered in documentation    Date: 06/20/2012 EMS ekg   Rate: 76  Rhythm: normal sinus rhythm  QRS Axis: normal  Intervals: normal  ST/T Wave abnormalities: ST  elevations inferiorly  Conduction Disutrbances:none  Narrative Interpretation:   Old EKG Reviewed: none available at time of interpretation          Suella [place]  Bebe Shaggy, MD 06/20/12 [phone]

## 2012-06-21 ENCOUNTER — Inpatient Hospital Stay (HOSPITAL_COMMUNITY): Payer: Medicare Other

## 2012-06-21 ENCOUNTER — Telehealth: Payer: Self-pay | Admitting: Physician Assistant

## 2012-06-21 DIAGNOSIS — I219 Acute myocardial infarction, unspecified: Secondary | ICD-10-CM

## 2012-06-21 DIAGNOSIS — I359 Nonrheumatic aortic valve disorder, unspecified: Secondary | ICD-10-CM

## 2012-06-21 LAB — CBC
HCT: 33.6 % — ABNORMAL LOW (ref 39.0–52.0)
Hemoglobin: 11.4 g/dL — ABNORMAL LOW (ref 13.0–17.0)
MCH: 31.8 pg (ref 26.0–34.0)
MCHC: 33.9 g/dL (ref 30.0–36.0)
MCV: 93.6 fL (ref 78.0–100.0)
RBC: 3.59 MIL/uL — ABNORMAL LOW (ref 4.22–5.81)

## 2012-06-21 LAB — BASIC METABOLIC PANEL
BUN: 23 mg/dL (ref 6–23)
CO2: 26 mEq/L (ref 19–32)
Calcium: 8 mg/dL — ABNORMAL LOW (ref 8.4–10.5)
GFR calc non Af Amer: 35 mL/min — ABNORMAL LOW (ref >90)
Glucose, Bld: 110 mg/dL — ABNORMAL HIGH (ref 70–99)

## 2012-06-21 LAB — MAGNESIUM: Magnesium: 2.2 mg/dL (ref 1.5–2.5)

## 2012-06-21 MED ORDER — ALBUTEROL SULFATE (5 MG/ML) 0.5% IN NEBU
2.5000 mg | INHALATION_SOLUTION | RESPIRATORY_TRACT | Status: DC
  Administered 2012-06-21 – 2012-06-22 (×3): 2.5 mg via RESPIRATORY_TRACT
  Filled 2012-06-21 (×3): qty 0.5

## 2012-06-21 MED ORDER — FUROSEMIDE 10 MG/ML IJ SOLN: 60.0000 mg | Freq: Once | INTRAMUSCULAR | Status: AC

## 2012-06-21 MED ORDER — IPRATROPIUM BROMIDE 0.02 % IN SOLN
0.5000 mg | RESPIRATORY_TRACT | Status: DC
  Administered 2012-06-21 – 2012-06-22 (×3): 0.5 mg via RESPIRATORY_TRACT
  Filled 2012-06-21 (×3): qty 2.5

## 2012-06-21 MED ORDER — FUROSEMIDE 10 MG/ML IJ SOLN
INTRAMUSCULAR | Status: AC
  Administered 2012-06-21: 60 mg
  Filled 2012-06-21: qty 8

## 2012-06-21 MED ORDER — FUROSEMIDE 10 MG/ML IJ SOLN
40.0000 mg | Freq: Once | INTRAMUSCULAR | Status: AC
  Administered 2012-06-22: 40 mg via INTRAVENOUS
  Filled 2012-06-21: qty 4

## 2012-06-21 MED ORDER — ESCITALOPRAM OXALATE 10 MG PO TABS: 10.0000 mg | ORAL_TABLET | Freq: Every day | ORAL | Status: DC

## 2012-06-21 MED ORDER — LISINOPRIL 2.5 MG PO TABS: 2.5000 mg | ORAL_TABLET | Freq: Every day | ORAL | Status: DC

## 2012-06-21 MED ORDER — POTASSIUM CHLORIDE CRYS ER 20 MEQ PO TBCR
30.0000 meq | EXTENDED_RELEASE_TABLET | Freq: Once | ORAL | Status: AC
  Administered 2012-06-21: 30 meq via ORAL
  Filled 2012-06-21: qty 1

## 2012-06-21 NOTE — Progress Notes (Signed)
Pt in respiratory distress, increase respirations, decreased sats, using accessory muscles. And complaining of being tired and short of breath. B/P and HR elevated. Called MD to see pt and orders given.

## 2012-06-21 NOTE — Progress Notes (Signed)
Patient: Thomas [name] / Admit Date: 06/20/2012 / Date of Encounter: 06/21/2012, 6:50 AM   Subjective  Denies CP or SOB.   Objective   Telemetry: NSR/SB, 5 beats NSVT Awaiting EKG this AM Physical Exam: Filed Vitals:   06/21/12 0500  BP: 97/45  Pulse: 63  Temp:   Resp: 24  temp 98, 97%  General: Well developed elderly WM in no acute distress. Head: Normocephalic, atraumatic, sclera non-icteric, no xanthomas, nares are without discharge. Neck: JVD not elevated. Lungs: Coarse but clear bilaterally to auscultation without wheezes, rales, or rhonchi. Breathing is unlabored. Heart: RRR, high pitched soft SEM at RUSB, preserved S2, no rubs or gallops.  Abdomen: Soft, non-tender, non-distended with normoactive bowel sounds. No hepatomegaly. No rebound/guarding. No obvious abdominal masses. Msk:  Strength and tone appear normal for age. Extremities: No clubbing or cyanosis. No edema.  Distal pedal pulses are 2+ and equal bilaterally. R wrist bandaged without obvious complication, no oozing through gauze, nonpainful Neuro: Alert and oriented X 3. Moves all extremities spontaneously. Psych:  Responds to questions appropriately with a normal affect.    Intake/Output Summary (Last 24 hours) at 06/21/12 0650 Last data filed at 06/21/12 0500  Gross per 24 hour  Intake    825 ml  Output      0 ml  Net    825 ml    Inpatient Medications:  . aspirin  81 mg Oral Daily  . atorvastatin  40 mg Oral q1800  . clopidogrel  75 mg Oral Q breakfast  . escitalopram  10 mg Oral Daily  . feeding supplement  120 mL Oral BID BM  . fluticasone  2 spray Each Nare Daily  . latanoprost  1 drop Both Eyes QHS  . lisinopril  10 mg Oral Daily  . metoprolol tartrate  25 mg Oral BID  . mometasone-formoterol  2 puff Inhalation BID  . multivitamin with minerals  1 tablet Oral Daily  . senna-docusate  1 tablet Oral BID    Labs:  Recent Labs  06/20/12 1624 06/20/12 1949  NA 139 133*  K 3.9 4.4  CL  104 102  CO2  --  20  GLUCOSE 127* 96  BUN 26* 23  CREATININE 1.50* 1.38*  CALCIUM  --  8.5    Recent Labs  06/20/12 1624 06/20/12 1949  WBC  --  12.5*  NEUTROABS  --  9.5*  HGB 14.6 13.3  HCT 43.0 38.5*  MCV  --  92.1  PLT  --  156    Recent Labs  06/20/12 1949 06/21/12 0128  TROPONINI >20.00* >20.00*    Radiology/Studies:  No results found.   Assessment and Plan  1. CAD (ho remote PCI) with inferior STEMI s/p BMS to mid RCA 06/20/12 - 2D echo to eval LV function. Continue ASA, statin, BB.  2. Mod AS by echo 12/2011- 2D echo today to eval. 3. HTN with more recent hypotension in hospital - may limit med titration/usage. Hold lisinopril this AM and resume at lower dose in AM. 4. Subdural hematoma 04/2012 due to mechanical fall - got limited heparin yesterday. Dr. Kirke Corin spoke with Dr. Gerlene Fee yesterday who was not impressed with CT findings from that admission. Follow neurologically. 5. CKD stage III - follow Cr closely post cath. No LV gram done yesterday. Lisnopril to be adjusted as above. 6. Leukocytosis - suspect due to acute MI. Follow. Obtain baseline CXR.  7. NSVT - continue BB as BP allows. K WNL, add Mg  with next lab draw. May be reperfusion arrhythmia.  Signed, Ronie Spies PA-C Patient examined and agree except changes made.  Valera Castle, MD 06/21/2012 8:15 AM Patient examined and agree. Echo today. Slowly titrate meds as BP tolerates.  Valera Castle, MD 06/21/2012 8:16 AM

## 2012-06-21 NOTE — Progress Notes (Signed)
  Echocardiogram 2D Echocardiogram has been performed.  Lian Pounds, [facility] 06/21/2012, 10:23 AM

## 2012-06-21 NOTE — Care Management Note (Addendum)
    Page 1 of 2   06/25/2012     4:33:00 PM   CARE MANAGEMENT NOTE 06/25/2012  Patient:  Thomas [name], Thomas [name]   Account Number:  [id number]  Date Initiated:  06/21/2012  Documentation initiated by:  Junius Creamer  Subjective/Objective Assessment:   adm w mi     Action/Plan:   lives w wife, pcp dr Jeannett Senior fry   Anticipated DC Date:  06/26/2012   Anticipated DC Plan:  SKILLED NURSING FACILITY  In-house referral  Clinical Social Worker      DC Planning Services  CM consult      [facility] Choice  Resumption Of Svcs/PTA Provider   Choice offered to / List presented to:          [facility] arranged  HH-1 RN  [employer] PT      [facility] agency  [facility]   Status of service:   Medicare Important Message given?   (If response is "NO", the following Medicare IM given date fields will be blank) Date Medicare IM given:   Date Additional Medicare IM given:    Discharge Disposition:    Per UR Regulation:  Reviewed for med. necessity/level of care/duration of stay  If discussed at Long Length of Stay Meetings, dates discussed:    Comments:  06/25/12 Yazmeen Woolf AEMRSON,RN,BSN [phone] PT HAS BEEN REFUSING TO RETURN TO SNF.  PHYSICAL THERAPY REASSESSED PT THIS AFTERNOON.  PER THEIR RECOMMENDATION, PT DOES NEED SNF PLACEMENT.  PT DOES HAVE HH AIDE UNTIL 6 PM DAILY, BUT AFTER THAT, ONLY THE WIFE IS THERE.  P.T. STATES WIFE WILL NOT BE ADEQUATE PHYSICAL SUPPORT FOR PT, AS HE CANNOT GET OOB BY HIMSELF.  P.T DISCUSSED SNF REHAB WITH PT AND HE STATED HE WOULD GO "IF HE HAD TO."  PLEASE SEE PT EVAL 4/14.  CSW MEETING WITH PT AS WELL TODAY TO RECONSIDER SNF.  WOULD BE HAPPY TO ARRANGE HH FOR THIS PT AND DC HOME, BUT UNFORTUNATLEY, I FEEL THIS WILL NOT BE A SAFE PLAN FOR HIM.  I THINK HE REALIZES THIS TOO, BUT NATURALLY IS DEPRESSED OF THE IDEA OF GOING BACK TO SNF.  4/10 1308 debbie dowell rn,bsn wife hires assist at time. pt recently in snf. pt has daughter. nse says pt has hhc agency in home at present providing  phy there. checking to see which agency is in the home. sw ref made. nse to ask for phy ther in hosp eval.

## 2012-06-21 NOTE — Progress Notes (Addendum)
Called by RN re: patient with increased dyspnea on minimal exertion progressing to resting dyspnea and hypoxia. His dyspnea has gradually worsened throughout the day. He becomes hypoxic (O2 sat's in 70s) on minimal exertion requiring increasing O2 via n/c. High 90s on 5L. On exam, JVP 7-8 cm, bibasilar rales and expiratory wheezing appreciated. + Use of accessory muscles, however only mildly increased resp effort. I/O + 2150 mL since yesterday. 2D echo today- EF 45%, grade 1 diastolic dysfxn, multiple WMAs, mild MR and PASP 52 mmHg. He is s/p PCI for inferior STEMI with a history of COPD and now ischemic cardiomyopathy/acute on chronic combined CHF. Reduce IVF to from 75 mL/hr to [facility], give Lasix 60 mg IV x 1, STAT portable CXR, atrovent, proventil nebs, monitor strict I/Os and daily weights. Supplement KCl. Will maintain a low threshold for BiPAP- patient likely has poor pulmonary reserve. Will continue to monitor. Cr trending up. Hold ACEi for now. CMET ordered for tomorrow.    Jacqulyn Bath, PA-C 06/21/2012 7:13 PM  Attending Note:   The patient was seen and examined.  Agree with assessment and plan as noted above.  Changes made to the above note as needed.  He has already put out ~500 cc of urine and is feeling better..  I suspect this is a combination of COPD and some CHF.  Will give more lasix tomorrow.  Check BMP   Alvia Grove., MD, [facility] 06/21/2012, 8:01 PM

## 2012-06-21 NOTE — Telephone Encounter (Signed)
I left voice message with verbal order.

## 2012-06-21 NOTE — Telephone Encounter (Signed)
I faxed script for Lexapro to [facility], put a hold on the Lisinopril for now, since pt's medications might be changing after coming home from the hospital. I will check in with pt next week to order the Lisinopril.

## 2012-06-21 NOTE — Progress Notes (Signed)
Came to get pt up and walk. Pt declines after waking, stating he doesn't feel like walking. Will f/u in am.  Ethelda Chick CES, aCSM

## 2012-06-22 DIAGNOSIS — I359 Nonrheumatic aortic valve disorder, unspecified: Secondary | ICD-10-CM

## 2012-06-22 LAB — COMPREHENSIVE METABOLIC PANEL
ALT: 22 U/L (ref 0–53)
AST: 64 U/L — ABNORMAL HIGH (ref 0–37)
Alkaline Phosphatase: 52 U/L (ref 39–117)
CO2: 30 mEq/L (ref 19–32)
GFR calc Af Amer: 38 mL/min — ABNORMAL LOW (ref >90)
GFR calc non Af Amer: 33 mL/min — ABNORMAL LOW (ref >90)
Glucose, Bld: 90 mg/dL (ref 70–99)
Potassium: 4.2 mEq/L (ref 3.5–5.1)
Sodium: 139 mEq/L (ref 135–145)
Total Protein: 5.7 g/dL — ABNORMAL LOW (ref 6.0–8.3)

## 2012-06-22 LAB — CBC
Hemoglobin: 11.4 g/dL — ABNORMAL LOW (ref 13.0–17.0)
Platelets: 137 10*3/uL — ABNORMAL LOW (ref 150–400)
RBC: 3.58 MIL/uL — ABNORMAL LOW (ref 4.22–5.81)

## 2012-06-22 MED ORDER — BUDESONIDE-FORMOTEROL FUMARATE 160-4.5 MCG/ACT IN AERO
2.0000 | INHALATION_SPRAY | Freq: Two times a day (BID) | RESPIRATORY_TRACT | Status: DC
  Administered 2012-06-24 – 2012-06-26 (×5): 2 via RESPIRATORY_TRACT
  Filled 2012-06-22: qty 6

## 2012-06-22 MED ORDER — ALBUTEROL SULFATE (5 MG/ML) 0.5% IN NEBU
2.5000 mg | INHALATION_SOLUTION | RESPIRATORY_TRACT | Status: DC | PRN
  Filled 2012-06-22: qty 0.5

## 2012-06-22 MED ORDER — IPRATROPIUM BROMIDE 0.02 % IN SOLN
0.5000 mg | RESPIRATORY_TRACT | Status: DC | PRN
  Filled 2012-06-22: qty 2.5

## 2012-06-22 NOTE — Progress Notes (Signed)
Patient: Thomas [name] / Admit Date: 06/20/2012 / Date of Encounter: 06/22/2012, 7:11 AM   Subjective  Thomas [name] is a 79 yo with hx of CAD, CKD, COPD, mild AS, HTN  admitted with STEMI on 06/20/12.  He had successful PCI of mid RCA ( 3.5 x 15 Integrity BMS).  Because of his CKD, he received IVF for a longer than usual time and unfortunately He developed CHF yesterday afternoon.  He responded well to IV lasix and nebs.  This am he is doing quite well.    Objective   Telemetry: NSR/SB, 5 beats NSVT Awaiting EKG this AM Physical Exam: Filed Vitals:   06/22/12 0600  BP: 121/42  Pulse: 67  Temp:   Resp: 20  temp 98, 97%  General: Well developed elderly WM in no acute distress. Head: Normocephalic, atraumatic, sclera non-icteric, no xanthomas, nares are without discharge. Neck: JVD not elevated. Lungs: Coarse but clear bilaterally to auscultation without wheezes, rales, or rhonchi. Breathing is unlabored. Heart: RRR, high pitched soft SEM at RUSB, preserved S2, no rubs or gallops.  Abdomen: Soft, non-tender, non-distended with normoactive bowel sounds. No hepatomegaly. No rebound/guarding. No obvious abdominal masses. Msk:  Strength and tone appear normal for age. Extremities: No clubbing or cyanosis. No edema.  Distal pedal pulses are 2+ and equal bilaterally. R wrist bandaged without obvious complication, no oozing through gauze, nonpainful Neuro: Alert and oriented X 3. Moves all extremities spontaneously. Psych:  Responds to questions appropriately with a normal affect.    Intake/Output Summary (Last 24 hours) at 06/22/12 0711 Last data filed at 06/22/12 0500  Gross per 24 hour  Intake   1305 ml  Output   3025 ml  Net  -1720 ml    Inpatient Medications:  . albuterol  2.5 mg Nebulization Q4H  . aspirin  81 mg Oral Daily  . atorvastatin  40 mg Oral q1800  . clopidogrel  75 mg Oral Q breakfast  . escitalopram  10 mg Oral Daily  . feeding supplement  120 mL Oral BID BM  .  fluticasone  2 spray Each Nare Daily  . ipratropium  0.5 mg Nebulization Q4H  . latanoprost  1 drop Both Eyes QHS  . metoprolol tartrate  25 mg Oral BID  . mometasone-formoterol  2 puff Inhalation BID  . multivitamin with minerals  1 tablet Oral Daily  . senna-docusate  1 tablet Oral BID    Labs:  Recent Labs  06/20/12 1949 06/21/12 0913 06/22/12 0525  NA 133* 137 139  K 4.4 4.1 4.2  CL 102 105 105  CO2 20 26 30   GLUCOSE 96 110* 90  BUN 23 23 25*  CREATININE 1.38* 1.63* 1.69*  CALCIUM 8.5 8.0* 8.4  MG  --  2.2  --     Recent Labs  06/20/12 1949 06/21/12 0913 06/22/12 0525  WBC 12.5* 7.1 6.8  NEUTROABS 9.5*  --   --   HGB 13.3 11.4* 11.4*  HCT 38.5* 33.6* 33.0*  MCV 92.1 93.6 92.2  PLT 156 136* 137*    Recent Labs  06/20/12 1949 06/21/12 0128 06/21/12 0913  TROPONINI >20.00* >20.00* >20.00*    Radiology/Studies:  No results found.   Assessment and Plan  1. CAD (ho remote PCI) with inferior STEMI s/p BMS to mid RCA 06/20/12 - 2D echo to eval LV function. Continue ASA, statin, BB.   His home dose of Lisinopril has been held for now - slight hypotension, moderate renal insufficiency.  We  will need to review this at the time of discharge and potentially restart as he improves.     2.  CHF - post procedure , treated with IV Lasix ,  He is back to his normal state this am.  Breathing well.  2. Mod AS by echo 12/2011- 2D echo today to eval. 3. HTN with more recent hypotension in hospital - may limit med titration/usage. Hold lisinopril this AM and resume at lower dose in AM. 4. Subdural hematoma 04/2012 due to mechanical fall - got limited heparin yesterday. Dr. Kirke Corin spoke with Dr. Gerlene Fee yesterday who was not impressed with CT findings from that admission. Follow neurologically. 5. CKD stage III - follow Cr closely post cath. No LV gram done yesterday. Lisnopril to be adjusted as above. 6. Leukocytosis - suspect due to acute MI. Follow. Obtain baseline CXR.  7.  NSVT - continue BB as BP allows. K WNL, add Mg with next lab draw. May be reperfusion arrhythmia.  Will transfer to step down this am Will have cardiac rehab work with him.  Vesta Mixer, Montez Hageman., MD, [facility] 06/22/2012, 7:16 AM Office - [phone] Pager [phone]

## 2012-06-22 NOTE — Progress Notes (Signed)
Pt walked with PT. Fatigues easily. Will f/u tomorrow. Ethelda Chick CES, ACSM

## 2012-06-22 NOTE — Evaluation (Addendum)
Physical Therapy Evaluation Patient Details Name: Thomas [name] MRN: [id number] DOB: [birth year] Today's Date: 06/22/2012 Time: 4098-1191 PT Time Calculation (min): 14 min  PT Assessment / Plan / Recommendation Clinical Impression  Pt adm with STEMI.  Needs skilled PT to maximize I and safety so pt can eventually return home.  Feel pt could benefit from ST-SNF but pt refusing this.  If pt goes home recommend 24 hour assist and HHPT.    PT Assessment  Patient needs continued PT services    Follow Up Recommendations  SNF (if pt continues to refuse then HHPT with 24 hour assist.)    Does the patient have the potential to tolerate intense rehabilitation      Barriers to Discharge Decreased caregiver support      Equipment Recommendations  None recommended by PT    Recommendations for Other Services OT consult   Frequency Min 3X/week    Precautions / Restrictions Precautions Precautions: Fall Restrictions Weight Bearing Restrictions: No   Pertinent Vitals/Pain VSS      Mobility  Bed Mobility Bed Mobility: Supine to Sit;Sitting - Scoot to Edge of Bed Supine to Sit: 3: Mod assist;HOB elevated Sitting - Scoot to Edge of Bed: 3: Mod assist Transfers Transfers: Sit to Stand;Stand to Sit Sit to Stand: 3: Mod assist;With upper extremity assist;From bed Stand to Sit: 3: Mod assist;With armrests;With upper extremity assist;To bed;To chair/3-in-1 Details for Transfer Assistance: Assist to bring hips up and to control descent on way down. Ambulation/Gait Ambulation/Gait Assistance: 4: Min assist (+1 for lines) Ambulation Distance (Feet): 10 Feet Assistive device: Rolling walker Ambulation/Gait Assistance Details: Verbal cues to stay closer to walker and stand more erect.  Assist to steer walker. Gait Pattern: Step-through pattern;Decreased step length - right;Decreased step length - left;Shuffle;Trunk flexed Gait velocity: decr    Exercises     PT Diagnosis: Difficulty  walking;Generalized weakness  PT Problem List: Decreased strength;Decreased mobility;Decreased activity tolerance;Decreased balance;Decreased knowledge of use of DME PT Treatment Interventions: DME instruction;Gait training;Therapeutic exercise;Therapeutic activities;Patient/family education;Balance training;Functional mobility training;Stair training   PT Goals Acute Rehab PT Goals PT Goal Formulation: With patient Time For Goal Achievement: 07/06/12 Potential to Achieve Goals: Good Pt will go Supine/Side to Sit: with supervision PT Goal: Supine/Side to Sit - Progress: Goal set today Pt will go Sit to Supine/Side: with supervision PT Goal: Sit to Supine/Side - Progress: Goal set today Pt will go Stand to Sit: with supervision PT Goal: Stand to Sit - Progress: Goal set today Pt will Ambulate: 51 - 150 feet;with supervision;with least restrictive assistive device PT Goal: Ambulate - Progress: Goal set today Pt will Go Up / Down Stairs: 1-2 stairs;with rail(s);with min assist PT Goal: Up/Down Stairs - Progress: Goal set today  Visit Information  Last PT Received On: 06/22/12 Assistance Needed: +2 (for lines)    Subjective Data  Subjective: "No way," pt stated when asked about returning to [facility] for rehab. Patient Stated Goal: Return home   Prior Functioning  Home Living Lives With: Spouse Available Help at Discharge: Family;Available 24 hours/day (wife elderly, son and daughter in law live nearby) Type of Home: House Home Access: Stairs to enter Secretary/administrator of Steps: 3 Entrance Stairs-Rails: Right Home Layout: Multi-level Bathroom Shower/Tub: Tub/shower unit;Walk-in shower Bathroom Toilet: Handicapped height Home Adaptive Equipment: Walker - rolling;Straight cane Prior Function Level of Independence: Independent with assistive device(s);Needs assistance Able to Take Stairs?: Yes Vocation: Retired Comments: Amb with rolling walker.  History of  falls. Communication Communication: [facility]  Cognition  Cognition Overall Cognitive Status: Appears within functional limits for tasks assessed/performed Arousal/Alertness: Awake/alert Orientation Level: Situation Behavior During Session: [facility] for tasks performed Cognition - Other Comments: Thought he was in hospital due to fall.    Extremity/Trunk Assessment Right Lower Extremity Assessment RLE ROM/Strength/Tone: Deficits RLE ROM/Strength/Tone Deficits: grossly 4-/5 Left Lower Extremity Assessment LLE ROM/Strength/Tone: Deficits LLE ROM/Strength/Tone Deficits: grossly 4-/5   Balance Balance Balance Assessed: Yes Static Standing Balance Static Standing - Balance Support: Bilateral upper extremity supported Static Standing - Level of Assistance: 4: Min assist (posterior lean )  End of Session PT - End of Session Equipment Utilized During Treatment: Gait belt Activity Tolerance: Patient limited by fatigue Patient left: in chair;with call bell/phone within reach Nurse Communication: Mobility status  GP     [facility] 06/22/2012, 11:41 AM  Skip Mayer PT [phone]

## 2012-06-22 NOTE — Clinical Social Work Note (Signed)
CSW met with pt at bedside.  Pt unable to speak easily and request that CSW contact wife, Tyler Aas.  CSW will make this collateral contact per pt request.  Vickii Penna, LCSWA [phone]  Clinical Social Work

## 2012-06-22 NOTE — Progress Notes (Signed)
Clinical Social Work Department BRIEF PSYCHOSOCIAL ASSESSMENT 06/22/2012  Patient:  Thomas [name], Thomas [name]     Account Number:  [id number]     Admit date:  06/20/2012  Clinical Social Worker:  Madaline Guthrie  Date/Time:  06/22/2012 03:30 PM  Referred by:  Care Management  Date Referred:  06/22/2012 Referred for  SNF Placement   Other Referral:   Interview type:  Patient Other interview type:    PSYCHOSOCIAL DATA Living Status:  WIFE Admitted from facility:   Level of care:   Primary support name:  Eleanora Neighbor Primary support relationship to patient:  SPOUSE Degree of support available:   Pt has caregivers and family who can assist him 24/7.    CURRENT CONCERNS Current Concerns  None Noted   Other Concerns:    SOCIAL WORK ASSESSMENT / PLAN CSW met with pt and caregiver, Amelia Jo.  Pt states he does not want to go to a SNF.  Pt and caregiver confirm that pt has around the clock care and support from caregivers and family.  Pt wants to receive PT from [facility].  He has had this in the past and hopes to get the same PT.  CSW also talked to pt's wife by phone.  She states she would like for pt to go to CIR since he is ademant about not wanting to go to a SNF.  She states if he is not a CIR candidate the family can manage pt at home, but states it will be hard. CSW talked to wife about [facility] services that would be set up by CM as well.  CSW is signing off.   Assessment/plan status:  No Further Intervention Required Other assessment/ plan:   Information/referral to community resources:    PATIENT'S/FAMILY'S RESPONSE TO PLAN OF CARE: Pt does not want SNF and states he has the in-home support and care that he needs.  CSW talked to wife as well and she stated she would like for pt to go to CIR  or home with [facility].

## 2012-06-23 DIAGNOSIS — I498 Other specified cardiac arrhythmias: Secondary | ICD-10-CM

## 2012-06-23 DIAGNOSIS — R001 Bradycardia, unspecified: Secondary | ICD-10-CM | POA: Diagnosis not present

## 2012-06-23 DIAGNOSIS — R5381 Other malaise: Secondary | ICD-10-CM | POA: Diagnosis present

## 2012-06-23 MED ORDER — METOPROLOL TARTRATE 12.5 MG HALF TABLET
12.5000 mg | ORAL_TABLET | Freq: Two times a day (BID) | ORAL | Status: DC
  Administered 2012-06-23 – 2012-06-26 (×5): 12.5 mg via ORAL
  Filled 2012-06-23 (×7): qty 1

## 2012-06-23 MED ORDER — ESCITALOPRAM OXALATE 10 MG PO TABS: 10.0000 mg | ORAL_TABLET | Freq: Every day | ORAL | Status: DC

## 2012-06-23 NOTE — Progress Notes (Signed)
Patient: Thomas [name] / Admit Date: 06/20/2012 / Date of Encounter: 06/23/2012, 11:51 AM   Subjective   Mr. Thomas [name] is a 79 yo with hx of CAD, CKD, COPD, mild AS, HTN  admitted with STEMI on 06/20/12.  He had successful PCI of mid RCA ( 3.5 x 15 Integrity BMS).  Post-cath course c/b acute HF due to volume loading.  This am developed several transient episodes of bradycardia into low 30s. One 3 sec pause. Lopressor held this am.  Feels fine. Very weak. Being seen by PT. Recommend SNF (vs 24 HHRN)   Objective   Telemetry: NSR/SB, occasional episodes of SB in 30s. Several 2-3 sec pauses.    Awaiting EKG this AM Physical Exam: Filed Vitals:   06/23/12 0900  BP: 97/47  Pulse: 62  Temp:   Resp: 22  temp 98, 97%  General: Frail elderly WM in no acute distress. Head: Normocephalic, atraumatic, sclera non-icteric, no xanthomas, nares are without discharge. Neck: JVD not elevated. Lungs: Coarse but clear bilaterally to auscultation without wheezes, rales, or rhonchi. Breathing is unlabored. Heart: RRR, high pitched soft SEM at RUSB, preserved S2, no rubs or gallops.  Abdomen: Soft, non-tender, non-distended with normoactive bowel sounds. No hepatomegaly. No rebound/guarding. No obvious abdominal masses. Msk:  Strength and tone appear normal for age. Extremities: No clubbing or cyanosis. No edema.  Distal pedal pulses are 2+ and equal bilaterally.  Neuro: Alert and oriented X 3. Moves all extremities spontaneously. Psych:  Responds to questions appropriately with a normal affect.    Intake/Output Summary (Last 24 hours) at 06/23/12 1151 Last data filed at 06/22/12 2100  Gross per 24 hour  Intake    360 ml  Output    500 ml  Net   -140 ml    Inpatient Medications:  . aspirin  81 mg Oral Daily  . atorvastatin  40 mg Oral q1800  . budesonide-formoterol  2 puff Inhalation BID  . clopidogrel  75 mg Oral Q breakfast  . escitalopram  10 mg Oral Daily  . feeding supplement  120 mL Oral  BID BM  . fluticasone  2 spray Each Nare Daily  . latanoprost  1 drop Both Eyes QHS  . metoprolol tartrate  25 mg Oral BID  . mometasone-formoterol  2 puff Inhalation BID  . multivitamin with minerals  1 tablet Oral Daily  . senna-docusate  1 tablet Oral BID    Labs:  Recent Labs  06/20/12 1949 06/21/12 0913 06/22/12 0525  NA 133* 137 139  K 4.4 4.1 4.2  CL 102 105 105  CO2 20 26 30   GLUCOSE 96 110* 90  BUN 23 23 25*  CREATININE 1.38* 1.63* 1.69*  CALCIUM 8.5 8.0* 8.4  MG  --  2.2  --     Recent Labs  06/20/12 1949 06/21/12 0913 06/22/12 0525  WBC 12.5* 7.1 6.8  NEUTROABS 9.5*  --   --   HGB 13.3 11.4* 11.4*  HCT 38.5* 33.6* 33.0*  MCV 92.1 93.6 92.2  PLT 156 136* 137*    Recent Labs  06/20/12 1949 06/21/12 0128 06/21/12 0913  TROPONINI >20.00* >20.00* >20.00*    Radiology/Studies:  No results found.   Assessment and Plan   1. Inferior STEMI/CAD (ho remote PCI) with inferior STEMI s/p BMS to mid RCA 06/20/12 -  Continue ASA, plavix x 1 monthm statin, BB.  2.  Acute diastolic CHF - post procedure , treated with IV Lasix ,  He is back  to his normal state this am.  Breathing well. EF 45%   2. Mod AS by echo (mean 32mm HG0 3. HTN with more recent hypotension in hospital  4. Subdural hematoma 04/2012 due to mechanical fall  Dr. Kirke Corin spoke with Dr. Gerlene Fee yesterday who was not impressed with CT findings from that admission. Follow neurologically. 5. CKD stage III - follow Cr closely post cath. No LV gram done yesterday. Lisnopril on hold. Check labs in am 7. NSVT 8. Bradycardia   Doing well but remains very weak. Will cut b-blocker in setting of bradycardia. BP too low to restart ACE-I and at his age I would not restart ACE-I unless need for BP control. Main issue now is debility. He refuses [employer]. Will need Case management to help decide on dispo to help determine if he has enough help to go home safely. Can go to SDU today.  Jayvyn Haselton,MD 11:54  AM

## 2012-06-23 NOTE — Progress Notes (Signed)
Pt refused dinner tray; expressed to me that he was very tired and did not want to live anymore; pt got very tearful; and expressed that he did not think he was going to make it through the night.  Pt stated that he did not think his heart was strong enough; i called pt's daughter, [name], and let her know of pt's feelings.  Daughter was very Adult nurse. Emotional support given to pt & to daughter; turned on classical violin music per pt request.

## 2012-06-24 LAB — BASIC METABOLIC PANEL
Calcium: 8.7 mg/dL (ref 8.4–10.5)
GFR calc Af Amer: 51 mL/min — ABNORMAL LOW (ref >90)
GFR calc non Af Amer: 44 mL/min — ABNORMAL LOW (ref >90)
Potassium: 4.2 mEq/L (ref 3.5–5.1)
Sodium: 139 mEq/L (ref 135–145)

## 2012-06-24 NOTE — Progress Notes (Signed)
Patient: Thomas [name] / Admit Date: 06/20/2012 / Date of Encounter: 06/24/2012, 9:20 AM   Subjective   Mr. Cina is a 79 yo with hx of CAD, CKD, COPD, mild AS, HTN  admitted with STEMI on 06/20/12.  He had successful PCI of mid RCA ( 3.5 x 15 Integrity BMS).  Post-cath course c/b acute HF due to volume loading.   B-blocker decreased yesterday due to bradycardia. Very depressed last nights expressing thoughts of hopelessness. More hopeful this am but really wants to go home.   Denis CP/SOB. Very weak. Being seen by PT. Recommend SNF (vs 24 HHRN)   Objective   Telemetry: NSR/SB   Awaiting EKG this AM Physical Exam: Filed Vitals:   06/24/12 0834  BP: 117/60  Pulse: 57  Temp: 97.7 F (36.5 C)  Resp: 14  temp 98, 97%  General: Frail elderly WM in no acute distress. Head: Normocephalic, atraumatic, sclera non-icteric, no xanthomas, nares are without discharge. Neck: JVD not elevated. Lungs: Coarse but clear bilaterally to auscultation without wheezes, rales, or rhonchi. Breathing is unlabored. Heart: RRR, high pitched soft SEM at RUSB, preserved S2, no rubs or gallops.  Abdomen: Soft, non-tender, non-distended with normoactive bowel sounds. No hepatomegaly. No rebound/guarding. No obvious abdominal masses. Msk:  Strength and tone appear normal for age. Extremities: No clubbing or cyanosis. No edema.  Distal pedal pulses are 2+ and equal bilaterally.  Neuro: Alert and oriented X 3. Moves all extremities spontaneously. Psych:  Responds to questions appropriately with a normal affect.    Intake/Output Summary (Last 24 hours) at 06/24/12 0920 Last data filed at 06/24/12 0900  Gross per 24 hour  Intake    360 ml  Output   1300 ml  Net   -940 ml    Inpatient Medications:  . aspirin  81 mg Oral Daily  . atorvastatin  40 mg Oral q1800  . budesonide-formoterol  2 puff Inhalation BID  . clopidogrel  75 mg Oral Q breakfast  . escitalopram  10 mg Oral Daily  . feeding supplement   120 mL Oral BID BM  . fluticasone  2 spray Each Nare Daily  . latanoprost  1 drop Both Eyes QHS  . metoprolol tartrate  12.5 mg Oral BID  . mometasone-formoterol  2 puff Inhalation BID  . multivitamin with minerals  1 tablet Oral Daily  . senna-docusate  1 tablet Oral BID    Labs:  Recent Labs  06/22/12 0525 06/24/12 0514  NA 139 139  K 4.2 4.2  CL 105 102  CO2 30 29  GLUCOSE 90 86  BUN 25* 26*  CREATININE 1.69* 1.33  CALCIUM 8.4 8.7    Recent Labs  06/22/12 0525  WBC 6.8  HGB 11.4*  HCT 33.0*  MCV 92.2  PLT 137*   No results found for this basename: CKTOTAL, CKMB, TROPONINI,  in the last 72 hours  Radiology/Studies:  No results found.   Assessment and Plan   1. Inferior STEMI/CAD (ho remote PCI) with inferior STEMI s/p BMS to mid RCA 06/20/12 -  Continue ASA, plavix x 1 month, statin, BB.  2.  Acute diastolic CHF - post procedure , treated with IV Lasix ,  He is back to his normal state this am.  Breathing well. EF 45%   2. Mod AS by echo (mean 32mm HG0 3. HTN with more recent hypotension in hospital  4. Subdural hematoma 04/2012 due to mechanical fall  Dr. Kirke Corin spoke with Dr. Gerlene Fee yesterday  who was not impressed with CT findings from that admission. Follow neurologically. 5. CKD stage III - follow Cr closely post cath. No LV gram done yesterday. Lisnopril on hold. Check labs in am 7. NSVT 8. Bradycardia   Stable from cardiac perspective. Bradycardia improved on decreased dose of b-blocker. BP too low to add any other meds.  Main issue now is debility. He refuses [employer]. Will need Case management to help decide on dispo to help determine if he has enough help to go home safely. I am really hopeful we can send him home tomorrow with Home Health services.   Will transfer to tele today.   Daniel Bensimhon,MD 9:20 AM

## 2012-06-24 NOTE — Progress Notes (Signed)
Pt had 9 beat run non-sustained v-tach. Pt asymptomatic, VSS, in bed resting at the time of the episode. PA on call notified, will continue to monitor.

## 2012-06-25 DIAGNOSIS — I2119 ST elevation (STEMI) myocardial infarction involving other coronary artery of inferior wall: Secondary | ICD-10-CM | POA: Diagnosis present

## 2012-06-25 DIAGNOSIS — I5031 Acute diastolic (congestive) heart failure: Secondary | ICD-10-CM | POA: Diagnosis not present

## 2012-06-25 DIAGNOSIS — I959 Hypotension, unspecified: Secondary | ICD-10-CM | POA: Diagnosis not present

## 2012-06-25 DIAGNOSIS — I472 Ventricular tachycardia: Secondary | ICD-10-CM | POA: Diagnosis not present

## 2012-06-25 NOTE — Progress Notes (Addendum)
Physical Therapy Treatment Patient Details Name: Thomas [name] MRN: [id number] DOB: [birth year] Today's Date: 06/25/2012 Time: 1478-2956 PT Time Calculation (min): 25 min  PT Assessment / Plan / Recommendation Comments on Treatment Session  Upon reassessment, pt continues to demonstrate deficits in functional mobility and at this time is requiring moderate assist and max VCs.  Patient with poor carryover of safety techniques. At this time feel that patient is unsafe for tasks OOB and patient unable to perform bed mobility on his own. Per patient caregiver support with ability to provide level of assist needed will not be available 24 hours (male aide 830-noon, male aid 1-6pm) Wife only available caregiver after 6pm.  At this time, patient demonstrates unsafe behaviors and high risk for additional falls (s/p 2 recent falls at home). Patient does have 2 steps to enter the home. With current levels of assist required, feel patient will need SNF upon discharge to improve mobility, decrease burden of care, and reinforce safety.       Follow Up Recommendations  SNF (if pt continues to refuse then HHPT with 24 hour assist. )     Does the patient have the potential to tolerate intense rehabilitation     Barriers to Discharge Decreased caregiver support      Equipment Recommendations  None recommended by PT    Recommendations for Other Services OT consult  Frequency Min 3X/week   Plan Discharge plan remains appropriate    Precautions / Restrictions Precautions Precautions: Fall Restrictions Weight Bearing Restrictions: No   Pertinent Vitals/Pain NAD    Mobility  Bed Mobility Bed Mobility: Supine to Sit;Sitting - Scoot to Edge of Bed;Sit to Supine Supine to Sit: 3: Mod assist;HOB elevated Sitting - Scoot to Edge of Bed: 3: Mod assist Sit to Supine: 3: Mod assist Details for Bed Mobility Assistance: VC's for sequencing and technique, assist to elevate trunk for sidelying and rotate  hips.  Assist to raise LE to get back into bed. Patient unable to perform on own despite 3 attempts. Transfers Transfers: Sit to Stand;Stand to Sit Sit to Stand: 4: Min assist;With upper extremity assist;From bed Stand to Sit: 4: Min assist;With upper extremity assist;With armrests;To bed Details for Transfer Assistance: Continuous VCs for hand placement, assist to achieve standing, and assist to control descent with manual cues for hand placement. Ambulation/Gait Ambulation/Gait Assistance: 4: Min assist (+1 for lines) Ambulation Distance (Feet): 60 Feet Assistive device: Rolling walker Ambulation/Gait Assistance Details: VCs for pacing and stride, assist to help navigate rw. and correct LOB x2. Difficulty with turns. Gait Pattern: Step-through pattern;Decreased step length - right;Decreased step length - left;Shuffle;Trunk flexed Gait velocity: decreased (despite max VCs to increase) General Gait Details: overall modest improvements with gait, still demontrates instability requiring assist to prevent falls. Stairs: No        PT Diagnosis: Difficulty walking;Generalized weakness  PT Problem List: Decreased strength;Decreased mobility;Decreased activity tolerance;Decreased balance;Decreased knowledge of use of DME PT Treatment Interventions: DME instruction;Gait training;Therapeutic exercise;Therapeutic activities;Patient/family education;Balance training;Functional mobility training;Stair training   PT Goals Acute Rehab PT Goals PT Goal Formulation: With patient Time For Goal Achievement: 07/06/12 Potential to Achieve Goals: Good Pt will go Supine/Side to Sit: with supervision PT Goal: Supine/Side to Sit - Progress: Progressing toward goal Pt will go Sit to Supine/Side: with supervision PT Goal: Sit to Supine/Side - Progress: Progressing toward goal Pt will go Stand to Sit: with supervision PT Goal: Stand to Sit - Progress: Progressing toward goal Pt will Ambulate: 51 - 150  feet;with supervision;with least restrictive assistive device PT Goal: Ambulate - Progress: Progressing toward goal Pt will Go Up / Down Stairs: 1-2 stairs;with rail(s);with min assist PT Goal: Up/Down Stairs - Progress: Not progressing  Visit Information  Last PT Received On: 06/25/12 Assistance Needed: +1    Subjective Data  Subjective: I've been laying here for six days Patient Stated Goal: Return home   Cognition  Cognition Overall Cognitive Status: Appears within functional limits for tasks assessed/performed Arousal/Alertness: Awake/alert Orientation Level: Situation Behavior During Session: [facility] for tasks performed Cognition - Other Comments: Thought he was in hospital due to fall.    Balance  Balance Balance Assessed: Yes Static Sitting Balance Static Sitting - Balance Support: Feet supported Static Sitting - Level of Assistance: 4: Min assist Static Sitting - Comment/# of Minutes: 3 minutes; patient with poor trunk control and requires Max VCs to place feet on ground Static Standing Balance Static Standing - Balance Support: Bilateral upper extremity supported Static Standing - Level of Assistance: 5: Stand by assistance  End of Session PT - End of Session Equipment Utilized During Treatment: Gait belt Activity Tolerance: Patient limited by fatigue Patient left: in chair;with call bell/phone within reach Nurse Communication: Mobility status   GP     Fabio Asa 06/25/2012, 4:14 PM Charlotte Crumb, PT DPT  [phone]

## 2012-06-25 NOTE — Discharge Summary (Signed)
CARDIOLOGY DISCHARGE SUMMARY   Patient ID: Thomas [name] MRN: [id number] DOB/AGE: November 12, 1916 79 y.o.  Admit date: 06/20/2012 Discharge date: 06/26/2012  Primary Discharge Diagnosis:  ST elevation myocardial infarction (STEMI) of inferior wall, initial episode of care  Secondary Discharge Diagnosis:    Aortic stenosis, moderate   Subdural hematoma   CKD (chronic kidney disease) stage 3, GFR 30-59 ml/min   Physical deconditioning   Bradycardia   Acute diastolic heart failure   Arterial hypotension   NSVT (nonsustained ventricular tachycardia)  Consults: PT  Procedures: Selective Coronary Angiography, PTCA and stenting of the mid right coronary artery, 2D Echocardiogram   Hospital Course: Thomas [name] is a 79 y.o. male with a history of CAD. He had severe chest pain on the day of admission. EMS was called and his ECG was consistent with an inferior STEMI. He was transported emergently to [facility] and taken directly to the cath lab.  1. Inferior STEMI: Because of his recent subdural hematoma, Dr. Kirke Corin spoke with Dr. Gerlene Fee prior to the procedure. Dr. Gerlene Fee felt that CT findings from that admission should not prohibit anticoagulation use at this time. Dr. Kirke Corin minimized heparin use during the catheterization and a bare metal stent was placed to limit the duration of dual antiplatelet therapy. Full cardiac catheterization results are below. He had a bare-metal stent to his RCA and tolerated the procedure well. No left ventriculogram was performed to minimize contrast use. He was admitted to CCU.  2. Aortic stenosis: A 2-D echocardiogram was performed to assess his EF and his aortic valve. The full results are below.  3. Chronic kidney disease stage III: His BUN and creatinine were followed closely during his hospital stay. His renal function is at baseline at discharge.   4. acute diastolic heart failure: On the afternoon of his cath, he developed increasing shortness of breath and  hypoxia. He was seen urgently by the Dr Elease Hashimoto and was given IV Lasix. He was also given nebulizers. His volume status continued to be monitored as well as his renal function. He was not on a diuretic prior to admission and will be discharged on a diuretic with close attention to volume status. His weight at discharge is 140 pounds.  5. Bradycardia: He was started on a beta blocker but had some significant bradycardia. His dose was decreased. Currently, his heart rate will occasionally drop into the 40s while asleep but he is asymptomatic with this. While he is awake he has no significant bradycardia and has had no symptoms related to bradycardia. He also had some brief runs of nonsustained VT but they were mainly seen right after his MI. He is tolerating a low dose of metoprolol.  6. Hypotension: Prior to admission, he had been on Cardizem CD 180 mg daily and Mavik 4 mg daily. On admission both of these were held. He was started on lisinopril but did not tolerate this due to hypotension. On his current medication regimen, his systolic blood pressure is generally about 110.  7. Physical deconditioning: He was seen by cardiac rehabilitation but had problems with ambulation. Physical therapy worked with him during his hospital stay but he continued to require assistance. It was felt he would need 24 hour assistance at home. At first, the patient was reluctant to consider transition to a rehabilitation/skilled nursing facility. However, he finally agreed to rehabilitation placement. Case management and social work are aware.  By 06/26/2012, Thomas [name]'s volume status was felt stabilized at 140 lbs. His renal  function was at baseline. He was having no ischemic symptoms with exertion, although his ambulation and mobility needs considerable improvement. He was evaluated by Dr Graciela Husbands and considered stable for discharge, to follow up as an outpatient. He is to get a BMET on Monday and follow up with cardiology on the  29th.  Labs:   Lab Results  Component Value Date   WBC 6.8 06/22/2012   HGB 11.4* 06/22/2012   HCT 33.0* 06/22/2012   MCV 92.2 06/22/2012   PLT 137* 06/22/2012     Recent Labs Lab 06/22/12 0525 06/24/12 0514  NA 139 139  K 4.2 4.2  CL 105 102  CO2 30 29  BUN 25* 26*  CREATININE 1.69* 1.33  CALCIUM 8.4 8.7  PROT 5.7*  --   BILITOT 0.5  --   ALKPHOS 52  --   ALT 22  --   AST 64*  --   GLUCOSE 90 86   Pro B Natriuretic peptide (BNP)  Date/Time Value Range Status  06/21/2012  9:13 AM 8040.0* 0 - 450 pg/mL Final     Radiology: Dg Chest Port 1 View 06/21/2012  *RADIOLOGY REPORT*  Clinical Data: Myocardial infarction, leukocytosis  PORTABLE CHEST - 1 VIEW  Comparison: Chest x-ray of 05/08/2012  Findings: There is more prominence of pulmonary vascularity suggesting pulmonary vascular congestion with cardiomegaly and mild interstitial edema.  No definite effusion is seen.  There are diffuse degenerative changes present.  IMPRESSION: Probable mild pulmonary vascular congestion.  An early interstitial edema   Original Report Authenticated By: Dwyane Dee, M.D.    Dg Chest Port 1v Same Day 06/21/2012  *RADIOLOGY REPORT*  Clinical Data: Shortness of breath.  Wheezing.  PORTABLE CHEST - 1 VIEW SAME DAY  Comparison: One-view chest 06/21/2012.  Findings: Mild cardiomegaly is evident.  Pulmonary vascular congestion and edema has progressed.  Bilateral pleural effusions are now evident.  Bibasilar airspace disease likely reflects atelectasis, but infection is not excluded.  IMPRESSION:  1.  Progressive congestive heart failure. 2.  Bibasilar airspace disease.  While this may represent atelectasis, infection is not excluded.   Original Report Authenticated By: Marin Roberts, M.D.    Cardiac Cath: 06/20/2012  Left Main: Normal in size with 30% distal stenosis. Left Anterior Descending (LAD): Normal in size and mildly calcified. There is 40% tubular proximal stenosis. The rest of the vessel has  minor irregularities with no obstructive disease. 1st diagonal (D1): Small in size with diffuse 50% disease. 2nd diagonal (D2): Normal in size with 50% proximal disease. 3rd diagonal (D3): Very small in size. Circumflex (LCx): The vessel is occluded proximally with left to left collaterals noted. This appears to be a chronic occlusion. Right Coronary Artery: Very large in size and dominant. The vessel is overall moderately calcified. There is 30% tubular stenosis proximally. In the midsegment, there is a 99% thrombotic stenosis which is the culprit for inferior ST elevation myocardial infarction. There is TIMI 2 flow in the vessel. posterior descending artery: Very large in size with 40% ostial stenosis. posterior lateral branch: PL 1 is Medium in size with minor irregularities. PL 2 is large in size with 70-80% disease in the midsegment. PCI Data:  Vessel - mid RCA/Segment - 2  Percent Stenosis (pre) 99%  TIMI-flow 2  Stent 3.5 x 15 mm integrity bare-metal stent  Percent Stenosis (post) 0%  TIMI-flow (post) 3  Final Conclusions:  1. Inferior ST elevation myocardial infarction due to thrombotic stenosis in the mid RCA. There is  underlying significant 2 vessel coronary artery disease with chronically occluded left circumflex as well.  2. Successful angioplasty and bare-metal stent placement to the mid RCA.    EKG:  21-Jun-2012 07:28:19 [facility] ROUTINE RECORD Normal sinus rhythm Possible Left atrial enlargement Low voltage QRS Inferior infarct , age undetermined ST & T wave abnormality, consider lateral ischemia Abnormal ECG No significant change since last tracing 81mm/s 81mm/mV 100Hz  8.0.1 12SL 241 HD CID: 0 Referred by: Terrilee Files Confirmed By: Lewayne Bunting MD Vent. rate 65 BPM PR interval 162 ms QRS duration 84 ms QT/QTc 472/490 ms P-R-T axes 67 -18 -75  Echo:  06/21/2012 Conclusions - Left ventricle: LVEF is apprxoimately 45% with inferior hypokinesis,  posterior hypokinesis/akinesis. The cavity size was normal. Wall thickness was increased in a pattern of mild LVH. Doppler parameters are consistent with abnormal left ventricular relaxation (grade 1 diastolic dysfunction). - Aortic valve: AV is difficult to see well It is thickened, calcified with restricted motion. Peak and mean gradients through the valve are 50 and 32 mm Hg respectively consistent with moderate AS> - Mitral valve: Mild regurgitation. - Pulmonary arteries: PA peak pressure: 52mm Hg (S).   FOLLOW UP PLANS AND APPOINTMENTS Allergies  Allergen Reactions  . Sulfamethoxazole     REACTION: unspecified     Medication List    STOP taking these medications       diltiazem 180 MG 24 hr capsule  Commonly known as:  DILACOR XR     lisinopril 10 MG tablet  Commonly known as:  PRINIVIL,ZESTRIL     trandolapril 4 MG tablet  Commonly known as:  MAVIK      TAKE these medications       aspirin 81 MG chewable tablet  Chew 1 tablet (81 mg total) by mouth daily.     atorvastatin 40 MG tablet  Commonly known as:  LIPITOR  Take 1 tablet (40 mg total) by mouth daily.     bisacodyl 10 MG suppository  Commonly known as:  DULCOLAX  Place 1 suppository (10 mg total) rectally daily as needed.     budesonide-formoterol 160-4.5 MCG/ACT inhaler  Commonly known as:  SYMBICORT  Inhale 2 puffs into the lungs 2 (two) times daily.     clopidogrel 75 MG tablet  Commonly known as:  PLAVIX  Take 1 tablet (75 mg total) by mouth daily with breakfast.     escitalopram 10 MG tablet  Commonly known as:  LEXAPRO  Take 1 tablet (10 mg total) by mouth daily.     feeding supplement Liqd  Take 120 mLs by mouth 2 (two) times daily between meals.     fluticasone 50 MCG/ACT nasal spray  Commonly known as:  FLONASE  Place 2 sprays into the nose daily.     latanoprost 0.005 % ophthalmic solution  Commonly known as:  XALATAN  Place 1 drop into both eyes at bedtime.     metoprolol  succinate 25 MG 24 hr tablet  Commonly known as:  TOPROL XL  Take 1 tablet (25 mg total) by mouth daily.     multivitamin with minerals Tabs  Take 1 tablet by mouth daily.     nitroGLYCERIN 0.4 MG SL tablet  Commonly known as:  NITROSTAT  Place 0.4 mg under the tongue every 5 (five) minutes as needed. For chest pain     polyethylene glycol packet  Commonly known as:  MIRALAX / GLYCOLAX  Take 17 g by mouth daily as needed (  constipation).     sennosides-docusate sodium 8.6-50 MG tablet  Commonly known as:  SENOKOT-S  Take 1 tablet by mouth 2 (two) times daily. While taking pain meds to prevent constipation.     temazepam 30 MG capsule  Commonly known as:  RESTORIL  Take 1 capsule (30 mg total) by mouth at bedtime as needed for sleep.     traMADol 50 MG tablet  Commonly known as:  ULTRAM  Take 1 tablet (50 mg total) by mouth every 6 (six) hours as needed for pain.         Future Appointments Provider Department Dept Phone   07/10/2012 2:00 PM Rosalio Macadamia, NP [facility] Main Office [place]) [phone]     Follow-up Information   Follow up with Norma Fredrickson, NP On 07/10/2012. (at 2:00 pm)    Contact information:   [address] [place] [address] [phone]       BRING ALL MEDICATIONS WITH YOU TO FOLLOW UP APPOINTMENTS  Time spent with patient to include physician time: 61 min Signed: Theodore Demark, PA-C 06/26/2012, 5:15 PM Co-Sign MD

## 2012-06-25 NOTE — Progress Notes (Signed)
[id number] Cardiac RehabCARDIAC REHAB PHASE I   PRE:  Rate/Rhythm: 71 SR  BP:  Supine: 106/60  Sitting:   Standing:   SaO2: 93 2L 93 RA  MODE:  Ambulation: 32 ft   POST:  Rate/Rhythem: 97  BP:  Supine:   Sitting: 110/60  Standing:    SaO2: 87 RA increased to 92 RA with rest  Beatrix Fetters  On arrival pt in bed. Assisted X 2 used gait belt and walker to ambulate. Pt weak with standing. He was able to walk 332 feet in hall. Pt was exhausted as he returned to room. His left leg stopped moving when he got inside his door. We had to sit him down in a chair. Pt had no idea how unsafe he was being or that his left leg was not moving. We had to use gait belt to get him in chair safely. When we got him in chair RA sat 87%, with rest it increased to 92%. He was DOE and when questioned him he stated that he felt SOB.Pt was able to walk to recliner after  he rested in. He continues to say that he will not go to SNF. "I  would rather die than go there again." I have a strong man and woman who cares for me at home. When pt was in recliner RA sat 90%. Pt denies any cp with walking. He does c/o of right knee pain with walking.Call light in reach and instructed pt not to get up alone to call for assistance.

## 2012-06-25 NOTE — Progress Notes (Signed)
Patient: Thomas [name] / Admit Date: 06/20/2012 / Date of Encounter: 06/25/2012, 4:31 PM   Subjective   Mr. Tant is a 79 yo with hx of CAD, CKD, COPD, mild AS, HTN  admitted with STEMI on 06/20/12.  He had successful PCI of mid RCA ( 3.5 x 15 Integrity BMS).  Post-cath course c/b acute HF due to volume loading.   He had a 9 beat run of asymptomatic NSVT.   Denis CP/SOB. Very weak.  PT Recommend SNF (vs 24 HHRN) but patient wants to go home.    Objective   Telemetry: NSR/SB   Awaiting EKG this AM Physical Exam: Filed Vitals:   06/25/12 0934  BP: 121/58  Pulse: 79  Temp:   Resp:   temp 98, 97%  General: Frail elderly WM in no acute distress. Head: Normocephalic, atraumatic, sclera non-icteric, no xanthomas, nares are without discharge. Neck: JVD not elevated. Lungs: Coarse but clear bilaterally to auscultation without wheezes, rales, or rhonchi. Breathing is unlabored. Heart: RRR, high pitched soft SEM at RUSB, preserved S2, no rubs or gallops.  Abdomen: Soft, non-tender, non-distended with normoactive bowel sounds. No hepatomegaly. No rebound/guarding. No obvious abdominal masses. Msk:  Strength and tone appear normal for age. Extremities: No clubbing or cyanosis. No edema.  Distal pedal pulses are 2+ and equal bilaterally.  Neuro: Alert and oriented X 3. Moves all extremities spontaneously. Psych:  Responds to questions appropriately with a normal affect.    Intake/Output Summary (Last 24 hours) at 06/25/12 1631 Last data filed at 06/25/12 1300  Gross per 24 hour  Intake    480 ml  Output    850 ml  Net   -370 ml    Inpatient Medications:  . aspirin  81 mg Oral Daily  . atorvastatin  40 mg Oral q1800  . budesonide-formoterol  2 puff Inhalation BID  . clopidogrel  75 mg Oral Q breakfast  . escitalopram  10 mg Oral Daily  . feeding supplement  120 mL Oral BID BM  . fluticasone  2 spray Each Nare Daily  . latanoprost  1 drop Both Eyes QHS  . metoprolol tartrate   12.5 mg Oral BID  . mometasone-formoterol  2 puff Inhalation BID  . multivitamin with minerals  1 tablet Oral Daily  . senna-docusate  1 tablet Oral BID    Labs:  Recent Labs  06/24/12 0514  NA 139  K 4.2  CL 102  CO2 29  GLUCOSE 86  BUN 26*  CREATININE 1.33  CALCIUM 8.7   No results found for this basename: WBC, NEUTROABS, HGB, HCT, MCV, PLT,  in the last 72 hours No results found for this basename: CKTOTAL, CKMB, TROPONINI,  in the last 72 hours  Radiology/Studies:  No results found.   Assessment and Plan   1. Inferior STEMI/CAD (ho remote PCI) with inferior STEMI s/p BMS to mid RCA 06/20/12 -  Continue ASA, plavix x 1 month, statin, BB.  2.  Acute diastolic CHF - post procedure , treated with IV Lasix ,  He is back to his normal state this am.  Breathing well. EF 45%   2. Mod AS by echo (mean 32mm HG0 3. HTN with more recent hypotension in hospital  4. Subdural hematoma 04/2012 due to mechanical fall. No neurologic symptoms.  5. CKD stage III - stable.  7. NSVT: continue small dose Metoprolol.  8. Bradycardia   Stable from cardiac perspective.  BP too low to add any other meds.  Main issue now is debility. He wants to go home but he is very deconditioned with high risk for recurrent falls. He was reevaluated by PT today with no change in recommendations.   Spike Desilets,MD 4:31 PM

## 2012-06-26 MED ORDER — POLYETHYLENE GLYCOL 3350 17 G PO PACK
17.0000 g | PACK | Freq: Every day | ORAL | Status: DC
  Filled 2012-06-26: qty 1

## 2012-06-26 MED ORDER — TEMAZEPAM 30 MG PO CAPS: 30.0000 mg | ORAL_CAPSULE | Freq: Every evening | ORAL | Status: DC | PRN

## 2012-06-26 MED ORDER — BISACODYL 10 MG RE SUPP: 10.0000 mg | Freq: Every day | RECTAL | Status: DC | PRN

## 2012-06-26 MED ORDER — ATORVASTATIN CALCIUM 40 MG PO TABS: 40.0000 mg | ORAL_TABLET | Freq: Every day | ORAL | Status: DC

## 2012-06-26 MED ORDER — CLOPIDOGREL BISULFATE 75 MG PO TABS: 75.0000 mg | ORAL_TABLET | Freq: Every day | ORAL | Status: DC

## 2012-06-26 MED ORDER — METOPROLOL SUCCINATE ER 25 MG PO TB24: 25.0000 mg | ORAL_TABLET | Freq: Every day | ORAL | Status: DC

## 2012-06-26 MED ORDER — ASPIRIN 81 MG PO CHEW: 81.0000 mg | CHEWABLE_TABLET | Freq: Every day | ORAL | Status: DC

## 2012-06-26 MED ORDER — TRAMADOL HCL 50 MG PO TABS: 50.0000 mg | ORAL_TABLET | Freq: Four times a day (QID) | ORAL | Status: DC | PRN

## 2012-06-26 MED ORDER — POLYETHYLENE GLYCOL 3350 17 G PO PACK: 17.0000 g | PACK | Freq: Every day | ORAL | Status: DC | PRN

## 2012-06-26 NOTE — Clinical Social Work Placement (Signed)
     Clinical Social Work Department CLINICAL SOCIAL WORK PLACEMENT NOTE 06/26/2012  Patient:  Thomas [name], Thomas [name]  Account Number:  [id number] Admit date:  06/20/2012  Clinical Social Worker:  Margaree Mackintosh  Date/time:  06/26/2012 03:01 PM  Clinical Social Work is seeking post-discharge placement for this patient at the following level of care:   SKILLED NURSING   (*CSW will update this form in Epic as items are completed)   06/26/2012  Patient/family provided with [facility] Department of Clinical Social Works list of facilities offering this level of care within the geographic area requested by the patient (or if unable, by the patients family).  06/26/2012  Patient/family informed of their freedom to choose among providers that offer the needed level of care, that participate in Medicare, Medicaid or managed care program needed by the patient, have an available bed and are willing to accept the patient.  06/26/2012  Patient/family informed of [facility] ownership interest in [facility], as well as of the fact that they are under no obligation to receive care at this facility.  PASARR submitted to EDS on 06/26/2012 PASARR number received from EDS on 06/26/2012  FL2 transmitted to all facilities in geographic area requested by pt/family on   FL2 transmitted to all facilities within larger geographic area on   Patient informed that his/her managed care company has contracts with or will negotiate with  certain facilities, including the following:     Patient/family informed of bed offers received:   Patient chooses bed at  Physician recommends and patient chooses bed at    Patient to be transferred to  on   Patient to be transferred to facility by   The following physician request were entered in Epic:   Additional Comments:

## 2012-06-26 NOTE — Progress Notes (Addendum)
06/26/2012 6:35 PM Nursing note Discharge avs form, medications, follow up appointments and when to call MD reviewed with patient. Pt. Intermittently confused. Unable to confirm understanding. Pt. Refused to sign AVS form states he "just wants to leave". Copy provided for pt. Wife inside Heart Failure Education packet. Attempted to call report to [facility] multiple times. No answer. D/c iv lines. D/c tele.  MD signed yellow out of facility DNR form prior to transport. Will d/c to SNF as ordered.  Thomas [name], [name]

## 2012-06-26 NOTE — Progress Notes (Signed)
Clinical Social Worker received notification from [facility] that paperwork has been completed and they are ready for pt.  CSW to prepare dc packet.      Angelia Mould, MSW, [place] [phone]

## 2012-06-26 NOTE — Progress Notes (Signed)
Clinical Social Worker received new referral from [place], NP stating pt is ready for dc and would like a SNF.  CSW met with pt at bedside.  CSW introduced self, explained role, and provided support.  Pt confirmed interest in SNF.  Pt requested [facility].  CSW spoke with [place] at [facility]-no beds currently available.  CSW received permission to speak with pt's spouse.  CSW introduced self, explained role, and provided support.  Wife requested SNF close to home as she is dependent on others for transportation.  CSW reviewed options, wife expressed interest in [place].  CSW spoke with [facility] does have a bed available.  CSW spoke with wife.  Wife and caregiver on their way to [facility] now to tour and sign paperwork.  CSW updated RNCM and NP.     Angelia Mould, MSW, [place] [phone]

## 2012-06-26 NOTE — Progress Notes (Signed)
Patient Name: Thomas [name] Date of Encounter: 06/26/2012  Principal Problem:   ST elevation myocardial infarction (STEMI) of inferior wall, initial episode of care Active Problems:   Aortic stenosis, moderate   Subdural hematoma   CKD (chronic kidney disease) stage 3, GFR 30-59 ml/min   Physical deconditioning   Bradycardia   Acute diastolic heart failure   Arterial hypotension   NSVT (nonsustained ventricular tachycardia)    SUBJECTIVE: Says no BM in 8 days (Epic says 3 days), seems depressed about situation; Wants to [facility] today. Denies chest pain or SOB.  Feels terrific following PM OBJECTIVE Filed Vitals:   06/25/12 0934 06/25/12 2001 06/25/12 2101 06/26/12 0421  BP: 121/58 150/73  119/73  Pulse: 79 72  65  Temp:  97.9 F (36.6 C)  97.4 F (36.3 C)  TempSrc:  Oral  Oral  Resp:  16  16  Height:      Weight:    140 lb 3.4 oz (63.6 kg)  SpO2: 92% 94% 92% 95%    Intake/Output Summary (Last 24 hours) at 06/26/12 0644 Last data filed at 06/26/12 0113  Gross per 24 hour  Intake    600 ml  Output   1150 ml  Net   -550 ml   Filed Weights   06/22/12 0500 06/24/12 0600 06/26/12 0421  Weight: 143 lb 15.4 oz (65.3 kg) 140 lb 6.9 oz (63.7 kg) 140 lb 3.4 oz (63.6 kg)    PHYSICAL EXAM General: Well developed, elderly, male in no acute distress. Head: Normocephalic, atraumatic.  Neck:  without bruits, JVD 8. Lungs:  Resp regular and unlabored, rales bilateral bases. Heart: RRR, S1, S2, no S3, S4, 2/6 murmur; no rub. Abdomen: Soft, non-tender, non-distended, BS + x 4.  Extremities: No clubbing, cyanosis, no edema.  Neuro: Alert and oriented X 3. Moves all extremities spontaneously. Psych: Normal affect.  LABS: Basic Metabolic Panel: Recent Labs  06/24/12 0514  NA 139  K 4.2  CL 102  CO2 29  GLUCOSE 86  BUN 26*  CREATININE 1.33  CALCIUM 8.7   BNP: Pro B Natriuretic peptide (BNP)  Date/Time Value Range Status  06/21/2012  9:13 AM  8040.0* 0 - 450 pg/mL Final   Lab Results  Component Value Date   CHOL 267* 11/05/2008   HDL 48.80 11/05/2008   LDLDIRECT 180.1 11/05/2008   TRIG 224.0* 11/05/2008   CHOLHDL 5 11/05/2008   TELE:   SR, s brady, briefly into the 40s overnight. Do not see any significant bradycardia while awake     Radiology/Studies:Dg Chest Port 1v Same Day 06/21/2012  *RADIOLOGY REPORT*  Clinical Data: Shortness of breath.  Wheezing.  PORTABLE CHEST - 1 VIEW SAME DAY  Comparison: One-view chest 06/21/2012.  Findings: Mild cardiomegaly is evident.  Pulmonary vascular congestion and edema has progressed.  Bilateral pleural effusions are now evident.  Bibasilar airspace disease likely reflects atelectasis, but infection is not excluded.  IMPRESSION:  1.  Progressive congestive heart failure. 2.  Bibasilar airspace disease.  While this may represent atelectasis, infection is not excluded.   Original Report Authenticated By: Marin Roberts, M.D.     Current Medications:  . aspirin  81 mg Oral Daily  . atorvastatin  40 mg Oral q1800  . budesonide-formoterol  2 puff Inhalation BID  . clopidogrel  75 mg Oral Q breakfast  . escitalopram  10 mg Oral Daily  . feeding supplement  120 mL Oral BID BM  . fluticasone  2 spray Each Nare Daily  . latanoprost  1 drop Both Eyes QHS  . metoprolol tartrate  12.5 mg Oral BID  . mometasone-formoterol  2 puff Inhalation BID  . multivitamin with minerals  1 tablet Oral Daily  . senna-docusate  1 tablet Oral BID      ASSESSMENT AND PLAN: Principal Problem:   ST elevation myocardial infarction (STEMI) of inferior wall, initial episode of care - S/P BMS RCA, cont. ASA, Plavix, BB, statin  Active Problems:   Aortic stenosis, moderate - by echo, follow    Subdural hematoma - in Feb 2014, heparin use was limited, MA spoke with Dr Gerlene Fee and benefits>risks so BMS was used, DAPT x 30 days    CKD (chronic kidney disease) stage 3, GFR 30-59 ml/min, will recheck in am if pt still  here.    Physical deconditioning - PT and cardiac rehab seeing, pt needs 24 hr supervision, plan is for D/C to SNF for rehab.     Bradycardia - BB decreased, follow, no Sx    Acute diastolic heart failure - happened after post-cath hydration, treated, follow weights    Arterial hypotension - improved, cont to follow, off home BP meds and only on low-dose BB.    NSVT (nonsustained ventricular tachycardia) - BB is at the limit baseline HR will allow..      Constipation - daily and PRN Rx.  Plan - pt requests [facility], SW aware, d/c when bed available, today if arrangements can be made.    Signed, Theodore Demark , PA-C 6:44 AM 06/26/2012  As above  Continue current therapy Plan would be to discharge for rehab

## 2012-06-26 NOTE — Progress Notes (Signed)
06/26/2012 7:05 PM Attempted to call report again to [facility]. No answer. Report given to EMS transportation to share with [facility] upon arrival. Questions And concerns addressed. D/c to SNF per orders by EMS.  [name], [name]

## 2012-06-26 NOTE — Progress Notes (Signed)
CARDIAC REHAB PHASE I   PRE:  Rate/Rhythm: 70SR  BP:  Supine: 104/70  Sitting:   Standing:    SaO2: 94%RA  MODE:  Ambulation: 56 ft   POST:  Rate/Rhythm: 82  BP:  Supine:   Sitting: 120/78  Standing:    SaO2: 90%RA 1332-1400 Pt walked 56 ft on RA with gait belt use, rolling walker, and asst x 2. Pt c/o right knee being painful during walk. To BSC after walk. Call bell in reach. Pt's RN notified that pt on BSC. Tolerated better today than yesterday.   Luetta Nutting, RN BSN  06/26/2012 1:56 PM

## 2012-06-27 ENCOUNTER — Other Ambulatory Visit: Payer: Self-pay | Admitting: Geriatric Medicine

## 2012-06-27 MED ORDER — TEMAZEPAM 30 MG PO CAPS: 30.0000 mg | ORAL_CAPSULE | Freq: Every evening | ORAL | Status: DC | PRN

## 2012-06-28 ENCOUNTER — Non-Acute Institutional Stay (SKILLED_NURSING_FACILITY): Payer: Medicare Other | Admitting: Internal Medicine

## 2012-06-28 ENCOUNTER — Encounter: Payer: Self-pay | Admitting: Internal Medicine

## 2012-06-28 ENCOUNTER — Telehealth: Payer: Self-pay | Admitting: *Deleted

## 2012-06-28 DIAGNOSIS — I252 Old myocardial infarction: Secondary | ICD-10-CM

## 2012-06-28 DIAGNOSIS — I5031 Acute diastolic (congestive) heart failure: Secondary | ICD-10-CM

## 2012-06-28 DIAGNOSIS — I10 Essential (primary) hypertension: Secondary | ICD-10-CM

## 2012-06-28 DIAGNOSIS — I498 Other specified cardiac arrhythmias: Secondary | ICD-10-CM

## 2012-06-28 DIAGNOSIS — I350 Nonrheumatic aortic (valve) stenosis: Secondary | ICD-10-CM

## 2012-06-28 DIAGNOSIS — I359 Nonrheumatic aortic valve disorder, unspecified: Secondary | ICD-10-CM

## 2012-06-28 DIAGNOSIS — R001 Bradycardia, unspecified: Secondary | ICD-10-CM

## 2012-06-28 NOTE — Telephone Encounter (Signed)
I called pt wife to find out which SNF pt is in b/c he needed a bmp on 07/02/12 with results being faxed to our office as pt has appt on 07/10/12 at 2pm with Norma Fredrickson NP per Bjorn Loser barrett PA.  I have called [facility], and given the verbal order for the bmp to be drawn on 07/02/12 & send results to our office. Reminded them also of pt appt for 07/10/12 Mylo Red RN

## 2012-06-28 NOTE — Progress Notes (Signed)
Patient ID: Thomas [name], male   DOB: [birth year], 79 y.o.   MRN: [id number]   Thomas [name]:[id number] DOB: Jan 04, 1917 DOA: (Not on file)  Patient seen and examined on 06/27/12  PCP: Nelwyn Salisbury, MD   Primary cardiology: Ulice Brilliant.  Chief Complaint: post hospitalization SNF follow up.  HPI:  79 y/o pleasant male with hx of CAD ( s/p PCI in 95), aortic stenosis, CKD stage III, acute diastolic heart failure, HTN, HL, GERD, B12 deficiency and recent subdural hematoma, who was admitted to [facility] from 4/9-4/15 with sudden onset chest pain with EKG showing inferior STEMI. He was taken for cardiac cath which showed thrombotic stenosis in mid RCA and a bare metal stent was placed over mid RCA.  Shortly after cath he was hypoxic and short of breath and given IV lasix for diuresis. His symptoms improved. He was also started on metoprolol but developed episodes of bradycardia with HR dropping to 40s. The dose of metoprolol was reduced and HR was more stable thereafter. Patient's BP meds prior to admission including cardizem and trandolapril were discontinued as BP was low. Patient was found to be physically deconditioned and required assistance with ambulation. He was recommended for SNF and was discharged here. On evaluation today, patient is hard of hearing but appears well oriented. Denies any chest pain, SOB, palpitations, headache, blurry vision, fever, chills, orthopnea, PND, abdominal pain, nausea, vomiting, bowel symptoms. He does have urinary symptoms with BPH. He feels quite weak and has not started PT yet.  Review of Systems:  Constitutional: Denies fever, chills, diaphoresis, appetite change , c/o fatigue.  HEENT: Denies photophobia, eye pain, redness, hearing loss, ear pain, congestion, sore throat, rhinorrhea, sneezing, mouth sores, trouble swallowing, neck pain, neck stiffness and tinnitus.   Respiratory: has some shortness of breath on exertion, Denies SOB at rest,  orthopnea or PND,  cough, chest tightness,  and wheezing.   Cardiovascular: Denies chest pain, palpitations and leg swelling.  Gastrointestinal: Denies nausea, vomiting, abdominal pain, diarrhea, constipation, blood in stool and abdominal distention.  Genitourinary: C/o  urgency, frequency , Denies dysuria,, hematuria, flank pain and difficulty urinating.  Musculoskeletal: Denies myalgias, back pain, joint swelling, arthralgias , has  gait problem.  Skin: Denies pallor, rash and wound.  Neurological: has weakness, Denies dizziness, seizures, syncope,  light-headedness, numbness and headaches.  Hematological: Denies adenopathy. Easy bruising, personal or family bleeding history  Psychiatric/Behavioral: Denies suicidal ideation, mood changes, confusion, nervousness, sleep disturbance and agitation   Past Medical History  Diagnosis Date  . Mild aortic stenosis     last echo 08/2006  . Coronary artery disease     s/p PCI in 1995; Cath 2005 showed moderate 2 vessel CAD involving the distal Left main & LCx/OM, treated medically  . Myocardial infarction   . Hyperlipidemia   . Hypertension   . Vitamin B12 deficiency   . Neuropathy   . Allergic rhinitis   . OA (osteoarthritis)   . History of bladder cancer   . GERD (gastroesophageal reflux disease)   . Esophageal stricture   . Chronic kidney disease     over active bladder  . BPH (benign prostatic hypertrophy)     URINARY RETENTION-HAS INDWELLING FOLEY CATHETER  . Memory loss     short term memory loss which pt denies   Past Surgical History  Procedure Laterality Date  . Coronary angioplasty    . Appendectomy    . Cholecystectomy    . Tonsillectomy    .  Sigmoidoscopy  1999  . Colonoscopy    . Bilateral elbow surgery    . Left knee replacement    . Carotid endarterectomy  08/2008  . Esophagogastroduodenoscopy      dilation  . Transurethral resection of prostate N/A 04/25/2012    Procedure: TRANSURETHRAL RESECTION OF THE PROSTATE  WITH GYRUS INSTRUMENTS;  Surgeon: Sebastian Ache, MD;  Location: [facility];  Service: Urology;  Laterality: N/A;  . Cystoscopy with biopsy N/A 04/25/2012    Procedure: CYSTOSCOPY WITH BIOPSY;  Surgeon: Sebastian Ache, MD;  Location: [facility];  Service: Urology;  Laterality: N/A;   Social History:  reports that he quit smoking about 62 years ago. He has never used smokeless tobacco. He reports that he does not drink alcohol or use illicit drugs.  Allergies  Allergen Reactions  . Sulfamethoxazole     REACTION: unspecified    Family History  Problem Relation Age of Onset  . Coronary artery disease Brother   . Prostate cancer      first degree relative    Prior to Admission medications   Medication Sig Start Date End Date Taking? Authorizing Provider  aspirin 81 MG chewable tablet Chew 1 tablet (81 mg total) by mouth daily. 06/26/12   Rhonda G Barrett, PA-C  atorvastatin (LIPITOR) 40 MG tablet Take 1 tablet (40 mg total) by mouth daily. 06/26/12   Rhonda G Barrett, PA-C  bisacodyl (DULCOLAX) 10 MG suppository Place 1 suppository (10 mg total) rectally daily as needed. 06/26/12   Rhonda G Barrett, PA-C  budesonide-formoterol (SYMBICORT) 160-4.5 MCG/ACT inhaler Inhale 2 puffs into the lungs 2 (two) times daily.    Historical Provider, MD  clopidogrel (PLAVIX) 75 MG tablet Take 1 tablet (75 mg total) by mouth daily with breakfast. 06/26/12   Joline Salt Barrett, PA-C  escitalopram (LEXAPRO) 10 MG tablet Take 1 tablet (10 mg total) by mouth daily. 06/21/12   Nelwyn Salisbury, MD  feeding supplement (ENSURE COMPLETE) LIQD Take 120 mLs by mouth 2 (two) times daily between meals. 05/11/12   Rodolph Bong, MD  fluticasone [facility]) 50 MCG/ACT nasal spray Place 2 sprays into the nose daily. 03/22/12 03/22/13  Nelwyn Salisbury, MD  latanoprost (XALATAN) 0.005 % ophthalmic solution Place 1 drop into both eyes at bedtime.    Historical Provider, MD  metoprolol succinate (TOPROL XL) 25 MG 24 hr tablet Take 1 tablet (25 mg  total) by mouth daily. 06/26/12   Joline Salt Barrett, PA-C  Multiple Vitamin (MULTIVITAMIN WITH MINERALS) TABS Take 1 tablet by mouth daily.    Historical Provider, MD  nitroGLYCERIN (NITROSTAT) 0.4 MG SL tablet Place 0.4 mg under the tongue every 5 (five) minutes as needed. For chest pain    Historical Provider, MD  polyethylene glycol (MIRALAX / GLYCOLAX) packet Take 17 g by mouth daily as needed (constipation). 06/26/12   Rhonda G Barrett, PA-C  sennosides-docusate sodium (SENOKOT-S) 8.6-50 MG tablet Take 1 tablet by mouth 2 (two) times daily. While taking pain meds to prevent constipation. 04/26/12   Sebastian Ache, MD  temazepam (RESTORIL) 30 MG capsule Take 1 capsule (30 mg total) by mouth at bedtime as needed for sleep. 06/27/12   Oneal Grout, MD  traMADol (ULTRAM) 50 MG tablet Take 1 tablet (50 mg total) by mouth every 6 (six) hours as needed for pain. 06/26/12   Darrol Jump, PA-C    Physical Exam:  Filed Vitals:   06/28/12 1249  BP: 138/79  Pulse: 72  Temp: 97.3 F (  36.3 C)  Resp: 20  SpO2: 97%    Constitutional: Vital signs reviewed.  Patient is an elderly male lying in bed in NAD- Head: Normocephalic and atraumatic Mouth: no erythema or exudates, MMM Eyes: PERRL, EOMI, conjunctivae normal, No scleral icterus.  Neck: Supple, Trachea midline normal ROM, No JVD, mass, thyromegaly, or carotid bruit present.  Cardiovascular: RRR, S1 normal, S2 normal, no MRG, pulses symmetric and intact bilaterally Pulmonary/Chest: CTAB, no wheezes, rales, or rhonchi Abdominal: Soft. Non-tender, non-distended, bowel sounds are normal, no masses, organomegaly, or guarding present.  GU: no CVA tenderness Musculoskeletal: No joint deformities, erythema, or stiffness, ROM full and no nontender Ext: no edema and no cyanosis, pulses palpable bilaterally , Right radial cath site clean. Hematology: no cervical,adenopathy.  Neurological: A&O x3, Strenght is normal and symmetric bilaterally, cranial  nerve II-XII are grossly intact, no focal motor deficit, sensory intact  Skin: Warm, dry and intact. No rash, cyanosis, or clubbing.  Psychiatric: Normal mood and affect. speech and behavior is normal. Judgment and thought content normal. Cognition and memory are normal.   Labs on Admission:  Basic Metabolic Panel:  Recent Labs Lab 06/22/12 0525 06/24/12 0514  NA 139 139  K 4.2 4.2  CL 105 102  CO2 30 29  GLUCOSE 90 86  BUN 25* 26*  CREATININE 1.69* 1.33  CALCIUM 8.4 8.7   Liver Function Tests:  Recent Labs Lab 06/22/12 0525  AST 64*  ALT 22  ALKPHOS 52  BILITOT 0.5  PROT 5.7*  ALBUMIN 2.7*   No results found for this basename: LIPASE, AMYLASE,  in the last 168 hours No results found for this basename: AMMONIA,  in the last 168 hours CBC:  Recent Labs Lab 06/22/12 0525  WBC 6.8  HGB 11.4*  HCT 33.0*  MCV 92.2  PLT 137*   Cardiac Enzymes: No results found for this basename: CKTOTAL, CKMB, CKMBINDEX, TROPONINI,  in the last 168 hours BNP: No components found with this basename: POCBNP,  CBG: No results found for this basename: GLUCAP,  in the last 168 hours  Radiological Exams on Admission: No results found.    Assessment/Plan Acute inferior wall STEMI S/p cardiac cath with BMS placed over mid RCA. Stable. Continue ASA. Started on plavix. Continue lipitor, low dose metoprolol.  Has appointment with cardiology later this month.  Acute combined systolic and diastolic CHF occurred post cath. Improved with IV lasix. EF on echo was 45% with inferior and posterior hypokinesis with grade 1 diastolic dysfunction.  Currently euvolemic. Monitor daily weights.  CAD  as outlined above. continue ASA, statin and BB.   CKD stage 3 Renal function improved to 1.33 upon discharge . Will need repeat labs checked in 1 week.   Hypotension  his home BP prior to hospitalization were held as BP was low. Only discharged on low dose metoprolol. BP is stable.    Bradycardia Noted while in hospital, beta blocker dose reduced and HR has remained stable.   Weakness and deconditioning Seen by PT and noted to be quite weak with full assistance needed for ambulation.  and recommended for SNF.  He will be starting PT today.  Continue nutritional supplements.    Subdural hematoma Was admitted to [facility] in [facility] for subdural hematoma and managed medically. His ASA was held but restarted this admission after cardiac cath , along with plavix. H&H stable.  ? Depression On lexapro chronically. Also has mild dementia  Aortic stenosis Moderate AS on echo.   COPD Stable.  continue inhalers.     Activity of daily living Feeds independently, dresses independently, toileting dependently, needs full assistance with ambulation    Code Status: DNR Family Communication: none   Nakyla Bracco  06/28/2012, 12:50 PM     Total time spent: 60 minutes

## 2012-07-04 ENCOUNTER — Telehealth: Payer: Self-pay | Admitting: Nurse Practitioner

## 2012-07-04 ENCOUNTER — Telehealth: Payer: Self-pay | Admitting: *Deleted

## 2012-07-04 NOTE — Telephone Encounter (Signed)
S/w Greta from [facility] waiting to hear back if a bmet was drawn on Monday, 4/21 at the facility, orders were put in per Theodore Demark, PA at the hospital. Edgardo Roys did not know any information.

## 2012-07-04 NOTE — Telephone Encounter (Signed)
Message copied by Debbe Bales on Wed Jul 04, 2012 12:28 PM ------      Message from: Rosalio Macadamia      Created: Mon Jul 02, 2012 10:24 AM       Just start looking for a fax tomorrow from [employer]. If we do not get tomorrow, call them to send.            lori      ----- Message -----         From: Debbe Bales         Sent: 07/02/2012  10:20 AM           To: Rosalio Macadamia, NP                        ----- Message -----         From: Rosalio Macadamia, NP         Sent: 06/28/2012   2:38 PM           To: Vida Rigger,      Please hold on to this. We will take care of it Monday.            Lawson Fiscal      ----- Message -----         From: Darrol Jump, PA-C         Sent: 06/28/2012   2:12 PM           To: Rosalio Macadamia, NP            My fault, he went to [facility]. Didn't realize that info would help.      Bjorn Loser      ----- Message -----         From: Rosalio Macadamia, NP         Sent: 06/28/2012  10:57 AM           To: Darrol Jump, PA-C            Ok.      I was just trying to figure out if this was labs that I should be looking out for. There is no mention of where he was sent to (SNF).             lori      ----- Message -----         From: Darrol Jump, PA-C         Sent: 06/28/2012   9:20 AM           To: Rosalio Macadamia, NP            Just wanted you to be aware of the pt (you will be seeing him later this month) and know about the labs.            Thanks            ----- Message -----         From: Rosalio Macadamia, NP         Sent: 06/26/2012   6:51 PM           To: Darrol Jump, PA-C, Debbe Bales            Is this suppose to be coming to me???  lori      ----- Message -----         From: Darrol Jump, PA-C         Sent: 06/26/2012   3:16 PM           To: Barrie Folk, RN, Rosalio Macadamia, NP            Mr Espin is a TW patient, d/c to SNF. He had an MI and CHF. He needs a  [facility] Monday 4/21. The facility was supposed to do it and fax results over. He's 89, can you make sure the lab work happens and gets to the right person? He is supposed to see Lawson Fiscal on 4/29.            Thanks      Bjorn Loser                                     ------

## 2012-07-04 NOTE — Telephone Encounter (Signed)
Scanned labs from 07/02/12 are noted today on 07/04/12. No changes advised at this time.   Rosalio Macadamia, RN, ANP-C [facility] [address] [place], [place]  [address]

## 2012-07-04 NOTE — Telephone Encounter (Signed)
Received lab work

## 2012-07-04 NOTE — Telephone Encounter (Signed)
S/w greata today at [facility] assisted living at 8:00 am still havent heard back will call back this afternoon. Waiting to hear if Thomas [name] received his bmet and to send [place] the results

## 2012-07-10 ENCOUNTER — Encounter: Payer: Medicare Other | Admitting: Nurse Practitioner

## 2012-07-10 ENCOUNTER — Encounter: Payer: Medicare Other | Admitting: Cardiovascular Disease

## 2012-07-14 NOTE — Telephone Encounter (Signed)
Error

## 2012-07-26 ENCOUNTER — Encounter: Payer: Self-pay | Admitting: Nurse Practitioner

## 2012-07-26 ENCOUNTER — Non-Acute Institutional Stay (SKILLED_NURSING_FACILITY): Payer: Medicare Other | Admitting: Nurse Practitioner

## 2012-07-26 DIAGNOSIS — N183 Chronic kidney disease, stage 3 unspecified: Secondary | ICD-10-CM

## 2012-07-26 DIAGNOSIS — M25562 Pain in left knee: Secondary | ICD-10-CM

## 2012-07-26 DIAGNOSIS — M25569 Pain in unspecified knee: Secondary | ICD-10-CM | POA: Insufficient documentation

## 2012-07-26 DIAGNOSIS — R5381 Other malaise: Secondary | ICD-10-CM

## 2012-07-26 DIAGNOSIS — I10 Essential (primary) hypertension: Secondary | ICD-10-CM

## 2012-07-26 DIAGNOSIS — G4700 Insomnia, unspecified: Secondary | ICD-10-CM

## 2012-07-26 NOTE — Progress Notes (Signed)
Patient ID: Thomas [name], male   DOB: Jan 15, 1917, 79 y.o.   MRN: [id number]  Chief Complaint: medical management of chronic conditions   HPI:  79 y/o male with hx of CAD ( s/p PCI in 95), aortic stenosis, CKD stage III, acute diastolic heart failure, HTN, HL, GERD, B12 deficiency and recent subdural hematoma seen today for routine follow up. Denies any chest pain, SOB, palpitations, headache, blurry vision, fever, chills, orthopnea, PND, abdominal pain, nausea, vomiting, bowel symptoms. He does have urinary symptoms with BPH. Staff reports pt with frequent complaints of knee pain however on exam he denies pain in knee.   Review of Systems:  Review of Systems  Constitutional: Negative for fever and chills.  HENT: Negative for neck pain.   Respiratory: Negative for shortness of breath.   Cardiovascular: Negative for chest pain, palpitations and leg swelling.  Gastrointestinal: Negative for heartburn, abdominal pain, diarrhea and constipation.  Genitourinary: Positive for frequency. Negative for dysuria.  Musculoskeletal: Negative for myalgias, back pain and joint pain.  Neurological: Positive for weakness (working with therapy). Negative for dizziness and headaches.  Psychiatric/Behavioral: Negative for depression. The patient does not have insomnia.      Medications: Patient's Medications  New Prescriptions   No medications on file  Previous Medications   ASPIRIN 81 MG CHEWABLE TABLET    Chew 1 tablet (81 mg total) by mouth daily.   ATORVASTATIN (LIPITOR) 40 MG TABLET    Take 1 tablet (40 mg total) by mouth daily.   BISACODYL (DULCOLAX) 10 MG SUPPOSITORY    Place 1 suppository (10 mg total) rectally daily as needed.   BUDESONIDE-FORMOTEROL (SYMBICORT) 160-4.5 MCG/ACT INHALER    Inhale 2 puffs into the lungs 2 (two) times daily.   CLOPIDOGREL (PLAVIX) 75 MG TABLET    Take 1 tablet (75 mg total) by mouth daily with breakfast.   ESCITALOPRAM (LEXAPRO) 10 MG TABLET    Take 1 tablet (10 mg  total) by mouth daily.   FEEDING SUPPLEMENT (ENSURE COMPLETE) LIQD    Take 120 mLs by mouth 2 (two) times daily between meals.   FLUTICASONE (FLONASE) 50 MCG/ACT NASAL SPRAY    Place 2 sprays into the nose daily.   LATANOPROST (XALATAN) 0.005 % OPHTHALMIC SOLUTION    Place 1 drop into both eyes at bedtime.   METOPROLOL SUCCINATE (TOPROL XL) 25 MG 24 HR TABLET    Take 1 tablet (25 mg total) by mouth daily.   MULTIPLE VITAMIN (MULTIVITAMIN WITH MINERALS) TABS    Take 1 tablet by mouth daily.   NITROGLYCERIN (NITROSTAT) 0.4 MG SL TABLET    Place 0.4 mg under the tongue every 5 (five) minutes as needed. For chest pain   POLYETHYLENE GLYCOL (MIRALAX / GLYCOLAX) PACKET    Take 17 g by mouth daily as needed (constipation).   SENNOSIDES-DOCUSATE SODIUM (SENOKOT-S) 8.6-50 MG TABLET    Take 1 tablet by mouth 2 (two) times daily. While taking pain meds to prevent constipation.   TEMAZEPAM (RESTORIL) 30 MG CAPSULE    Take 1 capsule (30 mg total) by mouth at bedtime as needed for sleep.   TRAMADOL (ULTRAM) 50 MG TABLET    Take 1 tablet (50 mg total) by mouth every 6 (six) hours as needed for pain.   TROLAMINE SALICYLATE (ASPERCREME) 10 % CREAM    Apply 1 application topically 4 (four) times daily -  before meals and at bedtime.  Modified Medications   No medications on file  Discontinued Medications  No medications on file     Physical Exam: Physical Exam  Constitutional: He is oriented to person, place, and time. He appears well-developed and well-nourished.  HENT:  Head: Normocephalic and atraumatic.  Eyes: EOM are normal. Pupils are equal, round, and reactive to light.  Neck: Normal range of motion. Neck supple.  Cardiovascular: Normal rate, regular rhythm and normal heart sounds.   Pulmonary/Chest: Effort normal and breath sounds normal. No respiratory distress.  Abdominal: Soft. Bowel sounds are normal. He exhibits no distension. There is no tenderness.  Musculoskeletal: Normal range of motion.  He exhibits no edema and no tenderness.  Neurological: He is alert and oriented to person, place, and time.  Skin: Skin is warm and dry. No erythema.  Psychiatric:  Flat affect     Filed Vitals:   07/26/12 1624  BP: 134/69  Pulse: 74  Temp: 97.5 F (36.4 C)  Resp: 20      Labs reviewed: Basic Metabolic Panel:  Recent Labs  16/10/96 1949 06/21/12 0913 06/22/12 0525 06/24/12 0514  NA 133* 137 139 139  K 4.4 4.1 4.2 4.2  CL 102 105 105 102  CO2 20 26 30 29   GLUCOSE 96 110* 90 86  BUN 23 23 25* 26*  CREATININE 1.38* 1.63* 1.69* 1.33  CALCIUM 8.5 8.0* 8.4 8.7  MG  --  2.2  --   --     Liver Function Tests:  Recent Labs  05/07/12 1500 05/08/12 1248 06/22/12 0525  AST 20 19 64*  ALT 17 15 22   ALKPHOS 53 58 52  BILITOT 0.4 0.3 0.5  PROT 6.4 6.4 5.7*  ALBUMIN 3.3* 3.2* 2.7*    CBC:  Recent Labs  05/07/12 1500 05/08/12 1248  06/20/12 1949 06/21/12 0913 06/22/12 0525  WBC 6.8 6.8  < > 12.5* 7.1 6.8  NEUTROABS 4.4 4.6  --  9.5*  --   --   HGB 14.0 14.0  < > 13.3 11.4* 11.4*  HCT 41.3 40.7  < > 38.5* 33.6* 33.0*  MCV 94.7 93.8  < > 92.1 93.6 92.2  PLT 206.0 189  < > 156 136* 137*  < > = values in this interval not displayed.  Assessment/Plan Insomnia Stable on current PRN medication  CKD (chronic kidney disease) stage 3, GFR 30-59 ml/min Stable- Cr on 07/02/12 was 1.37  HYPERTENSION Patients hypertension is stable; continue current regimen. Will monitor and make changes as necessary.   Physical deconditioning Cont need to work with therapy- pain in left knee makes it more difficult to particpate  Knee pain On exam today pt reports not pain however limited participation in exam and limited ROM- will add tylenol 650 mg q 4 hours as needed for pain not to exceed 3 grams in 24 hours

## 2012-07-26 NOTE — Assessment & Plan Note (Signed)
On exam today pt reports not pain however limited participation in exam and limited ROM- will add tylenol 650 mg q 4 hours as needed for pain not to exceed 3 grams in 24 hours

## 2012-07-26 NOTE — Assessment & Plan Note (Signed)
Stable on current PRN medication

## 2012-07-26 NOTE — Assessment & Plan Note (Signed)
Stable- Cr on 07/02/12 was 1.37

## 2012-07-26 NOTE — Assessment & Plan Note (Signed)
Patients hypertension is stable; continue current regimen. Will monitor and make changes as necessary.  

## 2012-07-26 NOTE — Assessment & Plan Note (Signed)
Cont need to work with therapy- pain in left knee makes it more difficult to particpate

## 2012-08-08 ENCOUNTER — Encounter: Payer: Self-pay | Admitting: Nurse Practitioner

## 2012-08-08 ENCOUNTER — Non-Acute Institutional Stay (SKILLED_NURSING_FACILITY): Payer: Medicare Other | Admitting: Nurse Practitioner

## 2012-08-08 DIAGNOSIS — R5381 Other malaise: Secondary | ICD-10-CM

## 2012-08-08 DIAGNOSIS — J449 Chronic obstructive pulmonary disease, unspecified: Secondary | ICD-10-CM

## 2012-08-08 DIAGNOSIS — I10 Essential (primary) hypertension: Secondary | ICD-10-CM

## 2012-08-08 DIAGNOSIS — I5031 Acute diastolic (congestive) heart failure: Secondary | ICD-10-CM

## 2012-08-08 NOTE — Assessment & Plan Note (Signed)
Currently stable; due to hypotension in hospital only low dose betablocker was continued.

## 2012-08-08 NOTE — Assessment & Plan Note (Signed)
Currently stable- will need to be monitor as outpatient.

## 2012-08-08 NOTE — Assessment & Plan Note (Signed)
pt is stable for discharge home with 24 hour care- however will need therapy per home health . No DME needed. Rx written.  will need to follow up with PCP within 2 weeks.

## 2012-08-08 NOTE — Progress Notes (Signed)
Patient ID: Thomas [name], male   DOB: 07-06-1916, 79 y.o.   MRN: [id number]  Nursing Home Location:  [facility]   Place of Service: SNF (31)   Chief Complaint: AV: evaluation for discharge  HPI:  79 y/o pleasant male with hx of CAD ( s/p PCI in 95), aortic stenosis, CKD stage III, acute diastolic heart failure, HTN, HL, GERD, B12 deficiency and recent subdural hematoma, who was admitted to [facility] from 4/9-4/15 with sudden onset chest pain with EKG showing inferior STEMI. He was taken for cardiac cath which showed thrombotic stenosis in mid RCA and a bare metal stent was placed over mid RCA. Shortly after cath he was hypoxic and short of breath and given IV lasix for diuresis. His symptoms improved. He was also started on metoprolol but developed episodes of bradycardia with HR dropping to 40s. The dose of metoprolol was reduced and HR was more stable thereafter. Patient's BP meds prior to admission including cardizem and trandolapril were discontinued as BP was low. Patient was found to be physically deconditioned and required assistance with ambulation. He was recommended for SNF and was discharged to [facility] for short term rehab- pts family is now ready to take him home with 24 hour care and home health services.   Review of Systems:  Review of Systems  Constitutional: Negative for fever, chills and malaise/fatigue.  Respiratory: Negative for cough and shortness of breath.   Cardiovascular: Negative for chest pain, palpitations and leg swelling.  Gastrointestinal: Negative for abdominal pain, diarrhea and constipation.  Genitourinary: Negative for dysuria.  Musculoskeletal: Positive for joint pain (bilateral knee pain).  Skin: Negative for itching and rash.  Neurological: Negative for dizziness, weakness and headaches.  Psychiatric/Behavioral: Negative for depression. The patient is not nervous/anxious and does not have insomnia.       Medications: Patient's Medications  New Prescriptions   No medications on file  Previous Medications   ASPIRIN 81 MG CHEWABLE TABLET    Chew 1 tablet (81 mg total) by mouth daily.   ATORVASTATIN (LIPITOR) 40 MG TABLET    Take 1 tablet (40 mg total) by mouth daily.   BISACODYL (DULCOLAX) 10 MG SUPPOSITORY    Place 1 suppository (10 mg total) rectally daily as needed.   BUDESONIDE-FORMOTEROL (SYMBICORT) 160-4.5 MCG/ACT INHALER    Inhale 2 puffs into the lungs 2 (two) times daily.   CLOPIDOGREL (PLAVIX) 75 MG TABLET    Take 1 tablet (75 mg total) by mouth daily with breakfast.   ESCITALOPRAM (LEXAPRO) 10 MG TABLET    Take 1 tablet (10 mg total) by mouth daily.   FEEDING SUPPLEMENT (ENSURE COMPLETE) LIQD    Take 120 mLs by mouth 2 (two) times daily between meals.   FLUTICASONE (FLONASE) 50 MCG/ACT NASAL SPRAY    Place 2 sprays into the nose daily.   LATANOPROST (XALATAN) 0.005 % OPHTHALMIC SOLUTION    Place 1 drop into both eyes at bedtime.   METOPROLOL SUCCINATE (TOPROL XL) 25 MG 24 HR TABLET    Take 1 tablet (25 mg total) by mouth daily.   MULTIPLE VITAMIN (MULTIVITAMIN WITH MINERALS) TABS    Take 1 tablet by mouth daily.   NITROGLYCERIN (NITROSTAT) 0.4 MG SL TABLET    Place 0.4 mg under the tongue every 5 (five) minutes as needed. For chest pain   POLYETHYLENE GLYCOL (MIRALAX / GLYCOLAX) PACKET    Take 17 g by mouth daily as needed (constipation).   SENNOSIDES-DOCUSATE SODIUM (SENOKOT-S)  8.6-50 MG TABLET    Take 1 tablet by mouth 2 (two) times daily. While taking pain meds to prevent constipation.   TRAMADOL (ULTRAM) 50 MG TABLET    Take 1 tablet (50 mg total) by mouth every 6 (six) hours as needed for pain.   TROLAMINE SALICYLATE (ASPERCREME) 10 % CREAM    Apply 1 application topically 4 (four) times daily -  before meals and at bedtime.  Modified Medications   Modified Medication Previous Medication   TEMAZEPAM (RESTORIL) 30 MG CAPSULE temazepam (RESTORIL) 30 MG capsule      Take  22.5 mg by mouth at bedtime as needed for sleep.    Take 1 capsule (30 mg total) by mouth at bedtime as needed for sleep.  Discontinued Medications   No medications on file     Physical Exam:  Filed Vitals:   08/08/12 1002  BP: 132/84  Pulse: 72  Temp: 96.5 F (35.8 C)  Resp: 20    Physical Exam  Constitutional: He appears well-developed and well-nourished. No distress.  HENT:  Head: Normocephalic and atraumatic.  Mouth/Throat: Oropharynx is clear and moist.  Eyes: EOM are normal. Pupils are equal, round, and reactive to light.  Neck: Normal range of motion. Neck supple.  Cardiovascular: Normal rate, regular rhythm and normal heart sounds.   Pulmonary/Chest: Effort normal and breath sounds normal. No respiratory distress.  Abdominal: Soft. Bowel sounds are normal. He exhibits no distension.  Musculoskeletal: Normal range of motion. He exhibits no edema and no tenderness.  Skin: Skin is warm and dry. He is not diaphoretic.  Psychiatric:  Flat affect; HOH      Labs reviewed: 07/02/12  Sodium  143        135-145  mEq/L  SLN       Potassium  3.7        3.5-5.3  mEq/L  SLN       Chloride  111        96-112  mEq/L  SLN       CO2  25        19-32  mEq/L  SLN       Glucose  90        70-99  mg/dL  SLN       BUN  33     H  6-23  mg/dL  SLN       Creatinine  1.37     H  0.50-1.35  mg/dL  SLN       Calcium  8.2     L  8.4-10.5  mg/dL  SLN          Assessment/Plan Physical deconditioning pt is stable for discharge home with 24 hour care- however will need therapy per home health . No DME needed. Rx written.  will need to follow up with PCP within 2 weeks.    CKD (chronic kidney disease) stage 3, GFR 30-59 ml/min Currently stable- will need to be monitor as outpatient.   HYPERTENSION Currently stable; due to hypotension in hospital only low dose betablocker was continued.   COPD (chronic obstructive pulmonary disease) Patient is stable; continue current regimen  Acute  diastolic heart failure Improved; currently on beta blocker only will need to be closely monitored for fluid overload

## 2012-08-08 NOTE — Assessment & Plan Note (Signed)
Patient is stable; continue current regimen. 

## 2012-08-08 NOTE — Assessment & Plan Note (Signed)
Improved; currently on beta blocker only will need to be closely monitored for fluid overload

## 2012-08-13 ENCOUNTER — Telehealth: Payer: Self-pay | Admitting: Family Medicine

## 2012-08-13 ENCOUNTER — Other Ambulatory Visit: Payer: Self-pay | Admitting: Family Medicine

## 2012-08-13 DIAGNOSIS — F329 Major depressive disorder, single episode, unspecified: Secondary | ICD-10-CM

## 2012-08-13 NOTE — Telephone Encounter (Signed)
I did sent in 30 day supply to [facility] and also spoke with [facility], they will send out a 90 day supply and I spoke with pt's wife.

## 2012-08-13 NOTE — Telephone Encounter (Signed)
Pt called and requested a refill on Lexapro, wanted a local script to be sent in and also a 90 day supply to mail order.

## 2012-08-16 ENCOUNTER — Telehealth: Payer: Self-pay | Admitting: Family Medicine

## 2012-08-16 NOTE — Telephone Encounter (Signed)
I spoke with Marcelino Duster from [facility] and gave the verbal, okay per Dr. Clent Ridges.

## 2012-08-16 NOTE — Telephone Encounter (Signed)
Marcelino Duster from [facility] is calling because [facility] referred pt to them for physical therapy for strengthening and Marcelino Duster would like a order for home health nurse for medication teaching. Verbal is ok

## 2012-08-20 ENCOUNTER — Other Ambulatory Visit: Payer: Self-pay | Admitting: Family Medicine

## 2012-08-20 ENCOUNTER — Ambulatory Visit (INDEPENDENT_AMBULATORY_CARE_PROVIDER_SITE_OTHER): Payer: Medicare Other | Admitting: Family Medicine

## 2012-08-20 ENCOUNTER — Encounter: Payer: Self-pay | Admitting: Family Medicine

## 2012-08-20 VITALS — BP 118/60 | HR 73 | Temp 97.5°F

## 2012-08-20 DIAGNOSIS — I252 Old myocardial infarction: Secondary | ICD-10-CM

## 2012-08-20 DIAGNOSIS — G8929 Other chronic pain: Secondary | ICD-10-CM

## 2012-08-20 DIAGNOSIS — M25569 Pain in unspecified knee: Secondary | ICD-10-CM

## 2012-08-20 DIAGNOSIS — I10 Essential (primary) hypertension: Secondary | ICD-10-CM

## 2012-08-20 DIAGNOSIS — G589 Mononeuropathy, unspecified: Secondary | ICD-10-CM

## 2012-08-20 DIAGNOSIS — J209 Acute bronchitis, unspecified: Secondary | ICD-10-CM

## 2012-08-20 DIAGNOSIS — G4700 Insomnia, unspecified: Secondary | ICD-10-CM

## 2012-08-20 DIAGNOSIS — M1990 Unspecified osteoarthritis, unspecified site: Secondary | ICD-10-CM

## 2012-08-20 MED ORDER — GABAPENTIN 100 MG PO CAPS: 100.0000 mg | ORAL_CAPSULE | Freq: Three times a day (TID) | ORAL | Status: DC

## 2012-08-20 MED ORDER — TEMAZEPAM 30 MG PO CAPS: 30.0000 mg | ORAL_CAPSULE | Freq: Every evening | ORAL | Status: DC | PRN

## 2012-08-20 MED ORDER — AZITHROMYCIN 250 MG PO TABS: ORAL_TABLET | ORAL | Status: DC

## 2012-08-20 NOTE — Progress Notes (Signed)
  Subjective:    Patient ID: Thomas [name], male    DOB: 04-02-1916, 79 y.o.   MRN: [id number]  HPI Here to follow up after a rehab stay at [facility] after a myocardial infarction in April which was treated with a stent to the RCA. He has done well with rehab although he is still weak and requires assistance. He is having home health nursing and PT visits. His appetite is improving. He denies any chest pain or SOB. He does complain of burning pains in the legs and feet. We had spoken of this prior to the MI. He also complains of sharp pains in the right knee and of trouble bending the knee. He has trouble sleeping. He has taken Temazepam in the past at 15 mg with partial results. Also he has had a cough producing clear sputum for about a week. No fevers.    Review of Systems  Constitutional: Negative.   HENT: Negative.   Eyes: Negative.   Respiratory: Positive for cough. Negative for shortness of breath and wheezing.   Cardiovascular: Negative.   Gastrointestinal: Negative.   Musculoskeletal: Positive for arthralgias.  Psychiatric/Behavioral: Positive for sleep disturbance. Negative for dysphoric mood. The patient is not nervous/anxious.        Objective:   Physical Exam  Constitutional: He is oriented to person, place, and time.  Alert, in a wheelchair   Neck: No thyromegaly present.  Cardiovascular: Normal rate, regular rhythm, normal heart sounds and intact distal pulses.   Pulmonary/Chest: Effort normal and breath sounds normal. No respiratory distress. He has no wheezes. He has no rales.  Lymphadenopathy:    He has no cervical adenopathy.  Neurological: He is alert and oriented to person, place, and time.  Psychiatric: He has a normal mood and affect. His behavior is normal. Thought content normal.          Assessment & Plan:  Cover the bronchitis with a Zpack. Increase Temazepam to 30 mg qhs. Try Neurontin for the neuropathy on the legs, and we can titrate up on the dose  if needed. Refer to orthopedics for the knee pain. He cold benefit from a cortisone shot possibly.

## 2012-08-20 NOTE — Telephone Encounter (Signed)
Can we refill this? 

## 2012-08-23 ENCOUNTER — Telehealth: Payer: Self-pay | Admitting: Family Medicine

## 2012-08-23 NOTE — Telephone Encounter (Signed)
PT wife called and stated that they needed a 30 day supply of escitalopram (LEXAPRO) 10 MG tablet sent to [facility] on [address] and a 90 day supply [facility]. Please assist.

## 2012-08-23 NOTE — Telephone Encounter (Signed)
I spoke with [facility] pharmacy and they are going to try and fill enough until pt gets the mail order.

## 2012-08-27 ENCOUNTER — Telehealth: Payer: Self-pay | Admitting: Family Medicine

## 2012-08-27 NOTE — Telephone Encounter (Signed)
Yes, one alcoholic drink a day is okay

## 2012-08-27 NOTE — Telephone Encounter (Signed)
I spoke with pt's wife. 

## 2012-08-27 NOTE — Telephone Encounter (Signed)
[address] [place], [place] [address] p. [phone] f. [phone] To: [facility] (After Hours Triage) Fax: [phone] From: [facility] Date/ Time: 08/25/2012 4:29 PM Taken By: Forbes Cellar, CSR Caller: Molly Maduro Facility: home Patient: Thomas, [name] DOB: [birth year] Phone: [phone] Reason for Call: Is it okay for him to have a glass of wine or beer with his lunch? He wants to be sure it won't cause a problem with his medication. Regarding Appointment:

## 2012-08-29 DIAGNOSIS — F329 Major depressive disorder, single episode, unspecified: Secondary | ICD-10-CM

## 2012-08-29 DIAGNOSIS — H547 Unspecified visual loss: Secondary | ICD-10-CM

## 2012-08-29 DIAGNOSIS — R269 Unspecified abnormalities of gait and mobility: Secondary | ICD-10-CM

## 2012-08-29 DIAGNOSIS — Z48812 Encounter for surgical aftercare following surgery on the circulatory system: Secondary | ICD-10-CM

## 2012-08-29 DIAGNOSIS — Z7982 Long term (current) use of aspirin: Secondary | ICD-10-CM

## 2012-08-29 DIAGNOSIS — I5031 Acute diastolic (congestive) heart failure: Secondary | ICD-10-CM

## 2012-09-06 ENCOUNTER — Telehealth: Payer: Self-pay | Admitting: Family Medicine

## 2012-09-06 NOTE — Telephone Encounter (Signed)
Per pt's wife [name], pt was taken off the Lipitor per Dr. Daleen Squibb, is this correct?

## 2012-09-06 NOTE — Telephone Encounter (Signed)
I spoke with pt's wife and gave the below message.

## 2012-09-06 NOTE — Telephone Encounter (Signed)
Yes I agree that he should stop taking the Lipitor.

## 2012-09-07 ENCOUNTER — Telehealth: Payer: Self-pay | Admitting: Family Medicine

## 2012-09-07 MED ORDER — CLOPIDOGREL BISULFATE 75 MG PO TABS: 75.0000 mg | ORAL_TABLET | Freq: Every day | ORAL | Status: DC

## 2012-09-07 MED ORDER — METOPROLOL SUCCINATE ER 25 MG PO TB24: 25.0000 mg | ORAL_TABLET | Freq: Every day | ORAL | Status: DC

## 2012-09-07 NOTE — Telephone Encounter (Signed)
Refill request for Metoprolol & Plavix, call in a 30 day supply to local pharmacy and also a mail order.

## 2012-09-07 NOTE — Telephone Encounter (Signed)
I sent scripts e-scribe to mail order and also called in a 30 day supply. I also spoke with pt's wife.

## 2012-09-10 ENCOUNTER — Other Ambulatory Visit: Payer: Self-pay | Admitting: *Deleted

## 2012-09-10 MED ORDER — FLUTICASONE PROPIONATE 50 MCG/ACT NA SUSP: 2.0000 | Freq: Every day | NASAL | Status: DC

## 2012-09-10 MED ORDER — LATANOPROST 0.005 % OP SOLN: 1.0000 [drp] | Freq: Every day | OPHTHALMIC | Status: DC

## 2012-09-10 MED ORDER — LATANOPROST 0.005 % OP SOLN: 1.0000 [drp] | Freq: Every day | OPHTHALMIC | Status: AC

## 2012-09-21 ENCOUNTER — Telehealth: Payer: Self-pay | Admitting: Family Medicine

## 2012-09-21 NOTE — Telephone Encounter (Signed)
I spoke with pharmacy and it looks like the pt needs Plavix & Metoprolol. I did okay a refill on each. I left message advising pt to get in tough with the mail order and find out what is going on with pt's scripts.

## 2012-09-21 NOTE — Telephone Encounter (Signed)
I tried to call [facility] and was put on hold. I also left voice message for pt to return my call. I need to know what medication pt needs.

## 2012-09-21 NOTE — Telephone Encounter (Signed)
Pt need refill called in to a local pharmacy. He has not received it through mail order, not sure what it is? Pt only left a voice message, I will call pt back.

## 2012-10-08 ENCOUNTER — Encounter: Payer: Self-pay | Admitting: Family Medicine

## 2012-10-08 ENCOUNTER — Ambulatory Visit (INDEPENDENT_AMBULATORY_CARE_PROVIDER_SITE_OTHER): Payer: Medicare Other | Admitting: Family Medicine

## 2012-10-08 VITALS — BP 124/70 | HR 68 | Temp 97.6°F

## 2012-10-08 DIAGNOSIS — I359 Nonrheumatic aortic valve disorder, unspecified: Secondary | ICD-10-CM

## 2012-10-08 DIAGNOSIS — J438 Other emphysema: Secondary | ICD-10-CM

## 2012-10-08 DIAGNOSIS — R5381 Other malaise: Secondary | ICD-10-CM

## 2012-10-08 DIAGNOSIS — J439 Emphysema, unspecified: Secondary | ICD-10-CM

## 2012-10-08 DIAGNOSIS — I10 Essential (primary) hypertension: Secondary | ICD-10-CM

## 2012-10-08 DIAGNOSIS — M1990 Unspecified osteoarthritis, unspecified site: Secondary | ICD-10-CM

## 2012-10-08 DIAGNOSIS — M25561 Pain in right knee: Secondary | ICD-10-CM

## 2012-10-08 DIAGNOSIS — M25569 Pain in unspecified knee: Secondary | ICD-10-CM

## 2012-10-08 DIAGNOSIS — I350 Nonrheumatic aortic (valve) stenosis: Secondary | ICD-10-CM

## 2012-10-08 DIAGNOSIS — I252 Old myocardial infarction: Secondary | ICD-10-CM

## 2012-10-08 MED ORDER — TRAMADOL HCL 50 MG PO TABS: 50.0000 mg | ORAL_TABLET | Freq: Four times a day (QID) | ORAL | Status: DC | PRN

## 2012-10-08 MED ORDER — BUDESONIDE-FORMOTEROL FUMARATE 160-4.5 MCG/ACT IN AERO: 2.0000 | INHALATION_SPRAY | Freq: Two times a day (BID) | RESPIRATORY_TRACT | Status: DC

## 2012-10-08 NOTE — Progress Notes (Signed)
  Subjective:    Patient ID: Thomas [name], male    DOB: 03-Nov-1916, 79 y.o.   MRN: [id number]  HPI Here with his wife Thomas [name], his daughter Thomas [name], and their Radiographer, therapeutic. He has been doing fairly well all in all. No SOB or chest pain. He still complains of constant right knee pain. He had a cortisone shot in it last year and this helped for awhile. Taking Tramadol. He does PT twice a week with a therapist. Thomas [name] helps him walk for short distances around the house every day, such as going to the restroom. They ask if he can drink some alcohol every day, as he has done most of his life. His BP at home is stable in the 130s or 140s over 70s or 80s. His oxygen sats stay in the middle 90s. His appetite is good.    Review of Systems  Constitutional: Negative.   Respiratory: Negative.   Cardiovascular: Negative.   Gastrointestinal: Negative.   Musculoskeletal: Positive for arthralgias.       Objective:   Physical Exam  Constitutional: He is oriented to person, place, and time. He appears well-developed and well-nourished.  Alert, in his wheelchair   Eyes: Conjunctivae are normal. Pupils are equal, round, and reactive to light.  Neck: No thyromegaly present.  Cardiovascular: Normal rate, regular rhythm and intact distal pulses.  Exam reveals no gallop and no friction rub.   Has a 3/6 systolic murmur over the aortic area   Pulmonary/Chest: Effort normal and breath sounds normal. No respiratory distress. He has no wheezes. He has no rales.  Musculoskeletal: He exhibits no edema.  Lymphadenopathy:    He has no cervical adenopathy.  Neurological: He is alert and oriented to person, place, and time.          Assessment & Plan:  He seems to be doing well. He will see Dr. Lequita Halt again next month, and he may be able to give him another steroid shot in the knee at that time. Suggested he wear a neoprene support sleeve on the knee during the day. We all agreed that he can have one 12  oz beer with lunch every day and one ounce of wine or vodka with dinner every night.

## 2012-10-10 ENCOUNTER — Telehealth: Payer: Self-pay | Admitting: Family Medicine

## 2012-10-10 MED ORDER — FUROSEMIDE 20 MG PO TABS
20.0000 mg | ORAL_TABLET | Freq: Every day | ORAL | Status: DC
Start: 2012-10-10 — End: 2012-11-26

## 2012-10-10 NOTE — Telephone Encounter (Signed)
Pt's wife called and said that pt's legs are swelling from the knee up on both legs now. Please advise?

## 2012-10-10 NOTE — Telephone Encounter (Signed)
We will call in a diuretic to remove fluid. He should follow up with me in 2 weeks. If he gets SOB he should come in sooner. Call in Lasix 20 gm a day, #30 with 2 rf

## 2012-10-10 NOTE — Telephone Encounter (Signed)
I spoke with pt's wife [name] and I sent script in to pharmacy. Pt should follow up in about 2 weeks.

## 2012-10-12 ENCOUNTER — Telehealth: Payer: Self-pay | Admitting: Family Medicine

## 2012-10-12 MED ORDER — FLUTICASONE-SALMETEROL 250-50 MCG/DOSE IN AEPB: 1.0000 | INHALATION_SPRAY | Freq: Two times a day (BID) | RESPIRATORY_TRACT | Status: DC

## 2012-10-12 NOTE — Telephone Encounter (Signed)
Switch to Advair 250/50 to use one puff bid. Call in one year supply

## 2012-10-12 NOTE — Telephone Encounter (Signed)
[facility] called and stated that the pt budesonide-formoterol (SYMBICORT) 160-4.5 MCG/ACT inhaler RX bounced back, stating that it needed prior authorization. They stated that they would like to try advair instead. Please assist.

## 2012-10-12 NOTE — Telephone Encounter (Signed)
I sent script e-scribe. 

## 2012-10-12 NOTE — Telephone Encounter (Signed)
Pt needs Symbicort called in to [facility], while waiting on mail order. I did call script in and spoke with [facility].

## 2012-10-15 ENCOUNTER — Other Ambulatory Visit: Payer: Self-pay | Admitting: Geriatric Medicine

## 2012-10-16 ENCOUNTER — Other Ambulatory Visit: Payer: Self-pay | Admitting: Family Medicine

## 2012-10-16 ENCOUNTER — Telehealth: Payer: Self-pay | Admitting: Family Medicine

## 2012-10-16 MED ORDER — TRAMADOL HCL 50 MG PO TABS: 50.0000 mg | ORAL_TABLET | Freq: Four times a day (QID) | ORAL | Status: DC | PRN

## 2012-10-16 NOTE — Telephone Encounter (Signed)
Refill request for Ultram to local [facility] and also to [facility] Per Dr. Clent Ridges, can call in [facility] months to [facility] and a 1 month supply to [facility].

## 2012-10-16 NOTE — Telephone Encounter (Signed)
I called in to [facility] and faxed a order to [facility].

## 2012-10-18 ENCOUNTER — Encounter: Payer: Self-pay | Admitting: Cardiovascular Disease

## 2012-11-05 ENCOUNTER — Other Ambulatory Visit: Payer: Self-pay | Admitting: Family Medicine

## 2012-11-06 ENCOUNTER — Telehealth: Payer: Self-pay | Admitting: Family Medicine

## 2012-11-06 MED ORDER — BUDESONIDE-FORMOTEROL FUMARATE 160-4.5 MCG/ACT IN AERO: 2.0000 | INHALATION_SPRAY | Freq: Two times a day (BID) | RESPIRATORY_TRACT | Status: DC

## 2012-11-06 NOTE — Telephone Encounter (Signed)
I also had to resend script for Symbicort e-scribe to [facility].

## 2012-11-06 NOTE — Telephone Encounter (Signed)
Noted. Try not giving him the sleeping pill (Temazepam) for awhile and do not tell him that they are not giving it to him. See how he does

## 2012-11-06 NOTE — Telephone Encounter (Signed)
Pt has been talking to someone but there is no one in the room, wife thinks it might be the sleeping medication. Please advise?

## 2012-11-07 NOTE — Telephone Encounter (Signed)
I spoke with pt's wife and gave the below information.

## 2012-11-14 ENCOUNTER — Telehealth: Payer: Self-pay | Admitting: Family Medicine

## 2012-11-14 MED ORDER — TAMSULOSIN HCL 0.4 MG PO CAPS: 0.4000 mg | ORAL_CAPSULE | Freq: Every day | ORAL | Status: DC

## 2012-11-14 NOTE — Telephone Encounter (Signed)
Refill request for Flomax mail order and a local script.

## 2012-11-14 NOTE — Telephone Encounter (Signed)
I sent in mail order and called in a local script, also spoke with pt's wife.

## 2012-11-23 ENCOUNTER — Telehealth: Payer: Self-pay | Admitting: Family Medicine

## 2012-11-23 NOTE — Telephone Encounter (Signed)
Refill request for Furosemide 20 mg & Gabapentin 100 mg, please send to [facility].

## 2012-11-26 MED ORDER — GABAPENTIN 100 MG PO CAPS: 100.0000 mg | ORAL_CAPSULE | Freq: Three times a day (TID) | ORAL | Status: DC

## 2012-11-26 MED ORDER — FUROSEMIDE 20 MG PO TABS: 20.0000 mg | ORAL_TABLET | Freq: Every day | ORAL | Status: DC

## 2012-11-26 NOTE — Telephone Encounter (Signed)
I sent both scripts e-scribe. 

## 2012-12-03 ENCOUNTER — Other Ambulatory Visit: Payer: Self-pay | Admitting: Family Medicine

## 2012-12-04 ENCOUNTER — Telehealth: Payer: Self-pay | Admitting: Family Medicine

## 2012-12-04 MED ORDER — ATORVASTATIN CALCIUM 40 MG PO TABS: 40.0000 mg | ORAL_TABLET | Freq: Every day | ORAL | Status: DC

## 2012-12-04 NOTE — Telephone Encounter (Signed)
Refill request for Temazepam 30 mg take 1 po qhs prn and send to [facility]. Also refill on Atorvastatin and I did send this script e-scribe.

## 2012-12-05 MED ORDER — TEMAZEPAM 30 MG PO CAPS: 30.0000 mg | ORAL_CAPSULE | Freq: Every evening | ORAL | Status: DC | PRN

## 2012-12-05 NOTE — Telephone Encounter (Signed)
I faxed script for Temazepam to [facility]

## 2012-12-05 NOTE — Telephone Encounter (Signed)
Send in Temazepam #90 with one rf

## 2012-12-14 ENCOUNTER — Other Ambulatory Visit: Payer: Self-pay | Admitting: Family Medicine

## 2012-12-17 ENCOUNTER — Telehealth: Payer: Self-pay | Admitting: Family Medicine

## 2012-12-17 ENCOUNTER — Ambulatory Visit: Payer: Medicare Other | Admitting: Family

## 2012-12-17 MED ORDER — AZITHROMYCIN 250 MG PO TABS: ORAL_TABLET | ORAL | Status: DC

## 2012-12-17 MED ORDER — ALBUTEROL SULFATE (2.5 MG/3ML) 0.083% IN NEBU: 2.5000 mg | INHALATION_SOLUTION | RESPIRATORY_TRACT | Status: AC | PRN

## 2012-12-17 NOTE — Telephone Encounter (Signed)
Pt's wife wants to schedule pt for cough, congestion and both legs are swollen with discoloration.  Dr. Clent Ridges doesn't have an Acute slot for me to bring pt in and spouse refused any other provider.  Spouse needs Thursday or Friday appointment. Please advise.   Thanks a bunch  [employer]

## 2012-12-17 NOTE — Telephone Encounter (Signed)
Per his wife, he has been coughing for one week. He will come in to see [place] in a day or two when his aide can bring him

## 2012-12-17 NOTE — Telephone Encounter (Signed)
Pt needs a 30 day supply of Flomax sent to [facility], per Dr. Clent Ridges okay to send in. I did call in script with 1 refill.

## 2012-12-18 NOTE — Telephone Encounter (Signed)
Spoke w/ wife. Made appt for Wed morning, as she is concerned about legs.

## 2012-12-19 ENCOUNTER — Encounter: Payer: Self-pay | Admitting: Family Medicine

## 2012-12-19 ENCOUNTER — Ambulatory Visit (INDEPENDENT_AMBULATORY_CARE_PROVIDER_SITE_OTHER): Payer: Medicare Other | Admitting: Family Medicine

## 2012-12-19 VITALS — BP 118/68 | HR 52 | Temp 98.2°F

## 2012-12-19 DIAGNOSIS — I2510 Atherosclerotic heart disease of native coronary artery without angina pectoris: Secondary | ICD-10-CM

## 2012-12-19 DIAGNOSIS — I359 Nonrheumatic aortic valve disorder, unspecified: Secondary | ICD-10-CM

## 2012-12-19 DIAGNOSIS — I350 Nonrheumatic aortic (valve) stenosis: Secondary | ICD-10-CM

## 2012-12-19 DIAGNOSIS — I10 Essential (primary) hypertension: Secondary | ICD-10-CM

## 2012-12-19 DIAGNOSIS — I252 Old myocardial infarction: Secondary | ICD-10-CM

## 2012-12-19 MED ORDER — FUROSEMIDE 40 MG PO TABS: 40.0000 mg | ORAL_TABLET | Freq: Every day | ORAL | Status: DC

## 2012-12-19 NOTE — Progress Notes (Signed)
  Subjective:    Patient ID: Thomas [name], male    DOB: Jul 17, 1916, 79 y.o.   MRN: [id number]  HPI Here with his aide Thomas [name] for a week of increased coughing and leg swelling. No fever or chest pain. His wife Thomas [name] was here a few days ago with a bronchitis, and she was given a Zpack. We went ahead and called in a Zpack for Thomas [name] as well, and he has taken 3 days of this. He is about the same as before.    Review of Systems  Constitutional: Negative.   HENT: Negative.   Eyes: Negative.   Respiratory: Positive for cough, shortness of breath and wheezing.   Cardiovascular: Positive for leg swelling. Negative for chest pain and palpitations.       Objective:   Physical Exam  Constitutional: He appears well-developed and well-nourished.  Alert, soft audible wheezing  Neck: No JVD present. No thyromegaly present.  Cardiovascular: Normal rate, regular rhythm and intact distal pulses.   2/6 SM   Pulmonary/Chest: Effort normal.  Soft wheezes and crackles in all lung fields   Musculoskeletal:  2+ edema in both legs up to the hips   Lymphadenopathy:    He has no cervical adenopathy.  Neurological: He is alert.  Oriented to self and place but not to date           Assessment & Plan:  He shows signs of worsening CHF. He had an ECHO in April showing an EF of 45% but I suspect this has worsened. We will increase the Lasix to 40 mg daily. He will finish out the antibiotic. We will have Cardiology see him soon.

## 2013-01-08 ENCOUNTER — Encounter: Payer: Self-pay | Admitting: Cardiovascular Disease

## 2013-01-08 ENCOUNTER — Ambulatory Visit (INDEPENDENT_AMBULATORY_CARE_PROVIDER_SITE_OTHER): Payer: Medicare Other | Admitting: Cardiovascular Disease

## 2013-01-08 VITALS — BP 124/66 | HR 51 | Ht 70.0 in | Wt 171.0 lb

## 2013-01-08 DIAGNOSIS — I359 Nonrheumatic aortic valve disorder, unspecified: Secondary | ICD-10-CM

## 2013-01-08 DIAGNOSIS — I2510 Atherosclerotic heart disease of native coronary artery without angina pectoris: Secondary | ICD-10-CM

## 2013-01-08 DIAGNOSIS — Z0000 Encounter for general adult medical examination without abnormal findings: Secondary | ICD-10-CM

## 2013-01-08 DIAGNOSIS — I255 Ischemic cardiomyopathy: Secondary | ICD-10-CM

## 2013-01-08 DIAGNOSIS — I2589 Other forms of chronic ischemic heart disease: Secondary | ICD-10-CM

## 2013-01-08 NOTE — Patient Instructions (Signed)
Your physician wants you to follow-up in:  6 months. You will receive a reminder letter in the mail two months in advance. If you don't receive a letter, please call our office to schedule the follow-up appointment.   

## 2013-01-08 NOTE — Progress Notes (Signed)
History of Present Illness: 79 yo male with history of CAD, HTN, HLD, aortic valve stenosis who is here today for follow up. He has been followed by Dr. Daleen Squibb. He was admitted to [facility] April 2014 with inferior STEMI. He was found to have a sub-totally occluded mid RCA. A 3.5 x 15 mm bare metal stent was placed in the mid RCA. Echo 06/21/12 with moderate AS, LVEF=45%.   He is here today for follow up. He is doing well. No chest pain or SOB. His Lasix dose was recently increased by Dr. Clent Ridges for LE edema which is now resolved.   Primary Care Physician: Clent Ridges  Last Lipid Profile:Lipid Panel     Component Value Date/Time   CHOL 267* 11/05/2008 0920   TRIG 224.0* 11/05/2008 0920   HDL 48.80 11/05/2008 0920   CHOLHDL 5 11/05/2008 0920   VLDL 44.8* 11/05/2008 0920     Past Medical History  Diagnosis Date  . Aortic valve stenosis, moderate     last echo April 4014   . Coronary artery disease     s/p PCI in 1995; Cath 2005 showed moderate 2 vessel CAD involving the distal Left main & LCx/OM, treated medically  . Myocardial infarction   . Hyperlipidemia   . Hypertension   . Vitamin B12 deficiency   . Neuropathy   . Allergic rhinitis   . OA (osteoarthritis)   . History of bladder cancer   . GERD (gastroesophageal reflux disease)   . Esophageal stricture   . Chronic kidney disease     over active bladder  . BPH (benign prostatic hypertrophy)     URINARY RETENTION-HAS INDWELLING FOLEY CATHETER  . Memory loss     short term memory loss which pt denies    Past Surgical History  Procedure Laterality Date  . Coronary angioplasty    . Appendectomy    . Cholecystectomy    . Tonsillectomy    . Sigmoidoscopy  1999  . Colonoscopy    . Bilateral elbow surgery    . Left knee replacement      sees Dr. Ollen Gross   . Carotid endarterectomy  08/2008  . Esophagogastroduodenoscopy      dilation  . Transurethral resection of prostate N/A 04/25/2012    Procedure: TRANSURETHRAL RESECTION OF THE  PROSTATE WITH GYRUS INSTRUMENTS;  Surgeon: Sebastian Ache, MD;  Location: [facility];  Service: Urology;  Laterality: N/A;  . Cystoscopy with biopsy N/A 04/25/2012    Procedure: CYSTOSCOPY WITH BIOPSY;  Surgeon: Sebastian Ache, MD;  Location: [facility];  Service: Urology;  Laterality: N/A;    Current Outpatient Prescriptions  Medication Sig Dispense Refill  . albuterol (PROVENTIL) (2.5 MG/3ML) 0.083% nebulizer solution Take 3 mLs (2.5 mg total) by nebulization every 4 (four) hours as needed for wheezing or shortness of breath.  75 mL  11  . aspirin 81 MG chewable tablet Chew 1 tablet (81 mg total) by mouth daily.      Marland Kitchen atorvastatin (LIPITOR) 40 MG tablet Take 1 tablet (40 mg total) by mouth daily.  90 tablet  1  . budesonide-formoterol (SYMBICORT) 160-4.5 MCG/ACT inhaler Inhale 2 puffs into the lungs 2 (two) times daily.  3 Inhaler  3  . CARDIZEM CD 180 MG 24 hr capsule TAKE 1 CAPSULE DAILY  90 capsule  2  . clopidogrel (PLAVIX) 75 MG tablet Take 1 tablet (75 mg total) by mouth daily with breakfast.  90 tablet  3  . escitalopram (LEXAPRO) 10  MG tablet Take 1 tablet (10 mg total) by mouth daily.  90 tablet  3  . feeding supplement (ENSURE COMPLETE) LIQD Take 120 mLs by mouth 2 (two) times daily between meals.      . fluticasone (FLONASE) 50 MCG/ACT nasal spray USE 2 SPRAYS NASALLY DAILY  48 g  3  . furosemide (LASIX) 40 MG tablet Take 1 tablet (40 mg total) by mouth daily.  30 tablet  11  . gabapentin (NEURONTIN) 100 MG capsule Take 1 capsule (100 mg total) by mouth 3 (three) times daily.  180 capsule  3  . latanoprost (XALATAN) 0.005 % ophthalmic solution Place 1 drop into both eyes at bedtime.  7.5 mL  3  . metoprolol succinate (TOPROL XL) 25 MG 24 hr tablet Take 1 tablet (25 mg total) by mouth daily.  90 tablet  3  . Multiple Vitamin (MULTIVITAMIN WITH MINERALS) TABS Take 1 tablet by mouth daily.      Marland Kitchen NITROSTAT 0.4 MG SL tablet place 1 tablet by mouth under the tongue every 5 minutes if needed  25  tablet  0  . polyethylene glycol (MIRALAX / GLYCOLAX) packet Take 17 g by mouth daily as needed (constipation).  14 each    . sennosides-docusate sodium (SENOKOT-S) 8.6-50 MG tablet Take 1 tablet by mouth 2 (two) times daily. While taking pain meds to prevent constipation.  30 tablet  0  . tamsulosin (FLOMAX) 0.4 MG CAPS capsule Take 1 capsule (0.4 mg total) by mouth at bedtime.  90 capsule  1  . temazepam (RESTORIL) 30 MG capsule Take 1 capsule (30 mg total) by mouth at bedtime as needed for sleep.  90 capsule  1  . traMADol (ULTRAM) 50 MG tablet Take 1 tablet (50 mg total) by mouth every 6 (six) hours as needed for pain.  360 tablet  3  . trolamine salicylate (ASPERCREME) 10 % cream Apply 1 application topically 4 (four) times daily -  before meals and at bedtime.       No current facility-administered medications for this visit.    Allergies  Allergen Reactions  . Sulfamethoxazole     REACTION: unspecified    History   Social History  . Marital Status: Married    Spouse Name: N/A    Number of Children: N/A  . Years of Education: N/A   Occupational History  . Not on file.   Social History Main Topics  . Smoking status: Former Smoker -- 1.00 packs/day for 10 years    Quit date: 03/14/1950  . Smokeless tobacco: Never Used  . Alcohol Use: No  . Drug Use: No  . Sexual Activity: No   Other Topics Concern  . 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 Coronary artery disease Brother   . Prostate cancer      first degree relative    Review of Systems:  As stated in the HPI and otherwise negative.   BP 124/66  Pulse 51  Ht 5\' 10"  (1.778 m)  Wt 171 lb (77.565 kg)  BMI 24.54 kg/m2  Physical Examination: General: Well developed, well nourished, NAD HEENT: OP clear, mucus membranes moist SKIN: warm, dry. No rashes. Neuro: No focal deficits Musculoskeletal:  Muscle strength 5/5 all ext Psychiatric: Mood and affect normal Neck: No JVD, no carotid bruits, no thyromegaly, no lymphadenopathy. Lungs:Clear  bilaterally, no wheezes, rhonci, crackles Cardiovascular: Regular rate and rhythm. No murmurs, gallops or rubs. Abdomen:Soft. Bowel sounds present. Non-tender.  Extremities: No lower extremity edema. Pulses are 2 + in the bilateral DP/PT.  EKG:  Cardiac cath 06/20/12: Left Main: Normal in size with 30% distal stenosis.  Left Anterior Descending (LAD): Normal in size and mildly calcified. There is 40% tubular proximal stenosis. The rest of the vessel has minor irregularities with no obstructive disease.  1st diagonal (D1): Small in size with diffuse 50% disease.  2nd diagonal (D2): Normal in size with 50% proximal disease.  3rd diagonal (D3): Very small in size.  Circumflex (LCx): The vessel is occluded proximally with left to left collaterals noted. This appears to be a chronic occlusion.  Right Coronary Artery: Very large in size and dominant. The vessel is overall moderately calcified. There is 30% tubular stenosis proximally. In the midsegment, there is a 99% thrombotic stenosis which is the culprit for inferior ST elevation myocardial infarction. There is TIMI 2 flow in the vessel.  posterior descending artery: Very large in size with 40% ostial stenosis.  posterior lateral branch: PL 1 is Medium in size with minor irregularities. PL 2 is large in size with 70-80% disease in the midsegment.  PCI Note: Due to history of subdural hematoma, I elected to do the case with heparin only and gave only 3500 units. ACT was 247. a 6 [place] JR 4 guide catheter was inserted. A intuition coronary guidewire was used to cross the lesion. The lesion was predilated with a 2.5 x 12 balloon. The lesion was then stented with a 3.5 x 15 integrity bare-metal stent. Following PCI, there was 0% residual stenosis and TIMI-3 flow. Final angiography confirmed an excellent result  with a step up on a step down. The patient tolerated the procedure well. There were no immediate procedural complications. A TR band was used for radial hemostasis. The patient was transferred to the post catheterization recovery area for further monitoring.   Echo 06/21/12: Left ventricle: LVEF is apprxoimately 45% with inferior hypokinesis, posterior hypokinesis/akinesis. The cavity size was normal. Wall thickness was increased in a pattern of mild LVH. Doppler parameters are consistent with abnormal left ventricular relaxation (grade 1 diastolic dysfunction). - Aortic valve: AV is difficult to see well It is thickened, calcified with restricted motion. Peak and mean gradients through the valve are 50 and 32 mm Hg respectively consistent with moderate AS> - Mitral valve: Mild regurgitation. - Pulmonary arteries: PA peak pressure: 52mm Hg (S).  Assessment and Plan:   1. CAD: Stable. Recent MI with bare metal stent RCA in April 2014. Continue ASA, Plavix, statin, beta blocker. No recent lipids. Will repeat lipids and LFTs at next visit.   2. Aortic valve stenosis: Moderate by recent echo. Given age, not a surgical candidate if it progresses.   3. Ischemic cardiomyopathy: LVEF=45% by echo April 2014. Volume status is ok. Continue medical management  Flu shot today.   Extensive review of records today including reviewing cath and echo images.

## 2013-02-11 ENCOUNTER — Telehealth: Payer: Self-pay | Admitting: Family Medicine

## 2013-02-11 ENCOUNTER — Other Ambulatory Visit: Payer: Self-pay | Admitting: Family Medicine

## 2013-02-11 MED ORDER — FUROSEMIDE 40 MG PO TABS: 40.0000 mg | ORAL_TABLET | Freq: Two times a day (BID) | ORAL | Status: DC | PRN

## 2013-02-11 NOTE — Telephone Encounter (Signed)
Per Dr. Clent Ridges send in script for Lasix and its bid prn, which I did send script e-scribe to [facility] and left a voice message for [place].

## 2013-03-01 ENCOUNTER — Telehealth: Payer: Self-pay | Admitting: Family Medicine

## 2013-03-01 NOTE — Telephone Encounter (Signed)
Pt's wife [name] called and said that pt has been in a sad mood and a little confused. [name] thinks it has increased maybe due to the holiday season. Pt got very upset because he wanted to go shopping and was unable to do so. Pt is resting well right now.  I spoke with [name] and explained that Dr. Clent Ridges had already left for the day. I advised that pt could call and try to get advise from call a nurse, if pt could not wait till Monday 03/04/13.  [name] said that she would prefer to get advise from Dr. Clent Ridges, just wanted to know if any of the medications might need adjusting?

## 2013-03-04 NOTE — Telephone Encounter (Signed)
I spoke with pt's wife and updated medication in chart. Pt has plenty of this right now, no need to do a refill.

## 2013-03-04 NOTE — Telephone Encounter (Signed)
Lets increase the Lexapro to 20 mg daily. She can give him two 10 mg tabs a day. Also call in a one year supply of 20 mg tabs

## 2013-03-15 ENCOUNTER — Telehealth: Payer: Self-pay | Admitting: Cardiovascular Disease

## 2013-03-15 NOTE — Telephone Encounter (Signed)
Hr for 1 week has been in the 50-40's. Pt does not ambulate. Pt has home care assistant that takes vitals daily. Wife states that the pt is sleeping all the time. Dr Caryl Comes DOD was consulted and pt is to stop Toprol XL 25 mg. Pt is to go to ED with further pulse changes and to continue to check vitals. Toprol XL was taken today. Wife verbalized understanding, I will forward to DR/Nurse to update.

## 2013-03-15 NOTE — Telephone Encounter (Signed)
New message  Pt wife called states that the pt's heart rate has dropped within the last 2 days to the low 50's and 40's. Requests a call back to discuss medication alternatives. Please call

## 2013-03-18 NOTE — Telephone Encounter (Signed)
Spoke with pt's wife who reports pt is doing much better off Toprol. Heart rate is in mid 70's.

## 2013-03-18 NOTE — Telephone Encounter (Signed)
Pat, Can we follow up on this and see how he is feeling off of Toprol? Thanks, chris

## 2013-03-26 ENCOUNTER — Other Ambulatory Visit: Payer: Self-pay | Admitting: Family Medicine

## 2013-03-27 ENCOUNTER — Telehealth: Payer: Self-pay | Admitting: Family Medicine

## 2013-03-27 NOTE — Telephone Encounter (Signed)
Thomas [name] called and said that pt is having shortness of breath and asking for his breathing treatments more often, which he is doing at home. Pt's SpO2 % is 90 % to 93 %. Pt wants to know if oxygen at home would be helpful?

## 2013-03-28 ENCOUNTER — Inpatient Hospital Stay (HOSPITAL_COMMUNITY)
Admission: EM | Admit: 2013-03-28 | Discharge: 2013-04-01 | DRG: 291 | Disposition: A | Discharge status: Home / Self Care | Payer: Medicare Other | Attending: Internal Medicine | Admitting: Internal Medicine

## 2013-03-28 ENCOUNTER — Encounter (HOSPITAL_COMMUNITY): Payer: Self-pay | Admitting: Emergency Medicine

## 2013-03-28 ENCOUNTER — Inpatient Hospital Stay (HOSPITAL_COMMUNITY): Payer: Medicare Other

## 2013-03-28 ENCOUNTER — Emergency Department (HOSPITAL_COMMUNITY): Payer: Medicare Other

## 2013-03-28 DIAGNOSIS — E538 Deficiency of other specified B group vitamins: Secondary | ICD-10-CM

## 2013-03-28 DIAGNOSIS — S065XAA Traumatic subdural hemorrhage with loss of consciousness status unknown, initial encounter: Secondary | ICD-10-CM

## 2013-03-28 DIAGNOSIS — I10 Essential (primary) hypertension: Secondary | ICD-10-CM

## 2013-03-28 DIAGNOSIS — J40 Bronchitis, not specified as acute or chronic: Secondary | ICD-10-CM

## 2013-03-28 DIAGNOSIS — N320 Bladder-neck obstruction: Secondary | ICD-10-CM

## 2013-03-28 DIAGNOSIS — R5381 Other malaise: Secondary | ICD-10-CM

## 2013-03-28 DIAGNOSIS — R413 Other amnesia: Secondary | ICD-10-CM

## 2013-03-28 DIAGNOSIS — N183 Chronic kidney disease, stage 3 unspecified: Secondary | ICD-10-CM

## 2013-03-28 DIAGNOSIS — J189 Pneumonia, unspecified organism: Secondary | ICD-10-CM

## 2013-03-28 DIAGNOSIS — K5900 Constipation, unspecified: Secondary | ICD-10-CM

## 2013-03-28 DIAGNOSIS — R339 Retention of urine, unspecified: Secondary | ICD-10-CM | POA: Diagnosis not present

## 2013-03-28 DIAGNOSIS — N390 Urinary tract infection, site not specified: Secondary | ICD-10-CM

## 2013-03-28 DIAGNOSIS — S065X9A Traumatic subdural hemorrhage with loss of consciousness of unspecified duration, initial encounter: Secondary | ICD-10-CM

## 2013-03-28 DIAGNOSIS — R0902 Hypoxemia: Secondary | ICD-10-CM

## 2013-03-28 DIAGNOSIS — T443X5A Adverse effect of other parasympatholytics [anticholinergics and antimuscarinics] and spasmolytics, initial encounter: Secondary | ICD-10-CM | POA: Diagnosis not present

## 2013-03-28 DIAGNOSIS — R5383 Other fatigue: Secondary | ICD-10-CM

## 2013-03-28 DIAGNOSIS — J9600 Acute respiratory failure, unspecified whether with hypoxia or hypercapnia: Secondary | ICD-10-CM | POA: Diagnosis present

## 2013-03-28 DIAGNOSIS — K222 Esophageal obstruction: Secondary | ICD-10-CM

## 2013-03-28 DIAGNOSIS — N189 Chronic kidney disease, unspecified: Secondary | ICD-10-CM

## 2013-03-28 DIAGNOSIS — I255 Ischemic cardiomyopathy: Secondary | ICD-10-CM

## 2013-03-28 DIAGNOSIS — J9601 Acute respiratory failure with hypoxia: Secondary | ICD-10-CM

## 2013-03-28 DIAGNOSIS — R269 Unspecified abnormalities of gait and mobility: Secondary | ICD-10-CM

## 2013-03-28 DIAGNOSIS — K219 Gastro-esophageal reflux disease without esophagitis: Secondary | ICD-10-CM

## 2013-03-28 DIAGNOSIS — J441 Chronic obstructive pulmonary disease with (acute) exacerbation: Secondary | ICD-10-CM

## 2013-03-28 DIAGNOSIS — N401 Enlarged prostate with lower urinary tract symptoms: Secondary | ICD-10-CM | POA: Diagnosis present

## 2013-03-28 DIAGNOSIS — Z8042 Family history of malignant neoplasm of prostate: Secondary | ICD-10-CM

## 2013-03-28 DIAGNOSIS — I252 Old myocardial infarction: Secondary | ICD-10-CM

## 2013-03-28 DIAGNOSIS — J449 Chronic obstructive pulmonary disease, unspecified: Secondary | ICD-10-CM

## 2013-03-28 DIAGNOSIS — E785 Hyperlipidemia, unspecified: Secondary | ICD-10-CM

## 2013-03-28 DIAGNOSIS — Z87891 Personal history of nicotine dependence: Secondary | ICD-10-CM

## 2013-03-28 DIAGNOSIS — R059 Cough, unspecified: Secondary | ICD-10-CM

## 2013-03-28 DIAGNOSIS — R42 Dizziness and giddiness: Secondary | ICD-10-CM

## 2013-03-28 DIAGNOSIS — R1310 Dysphagia, unspecified: Secondary | ICD-10-CM

## 2013-03-28 DIAGNOSIS — Z8551 Personal history of malignant neoplasm of bladder: Secondary | ICD-10-CM

## 2013-03-28 DIAGNOSIS — R05 Cough: Secondary | ICD-10-CM

## 2013-03-28 DIAGNOSIS — I350 Nonrheumatic aortic (valve) stenosis: Secondary | ICD-10-CM | POA: Diagnosis present

## 2013-03-28 DIAGNOSIS — J309 Allergic rhinitis, unspecified: Secondary | ICD-10-CM

## 2013-03-28 DIAGNOSIS — I951 Orthostatic hypotension: Secondary | ICD-10-CM

## 2013-03-28 DIAGNOSIS — I2510 Atherosclerotic heart disease of native coronary artery without angina pectoris: Secondary | ICD-10-CM

## 2013-03-28 DIAGNOSIS — I129 Hypertensive chronic kidney disease with stage 1 through stage 4 chronic kidney disease, or unspecified chronic kidney disease: Secondary | ICD-10-CM | POA: Diagnosis present

## 2013-03-28 DIAGNOSIS — I6529 Occlusion and stenosis of unspecified carotid artery: Secondary | ICD-10-CM

## 2013-03-28 DIAGNOSIS — R001 Bradycardia, unspecified: Secondary | ICD-10-CM

## 2013-03-28 DIAGNOSIS — G589 Mononeuropathy, unspecified: Secondary | ICD-10-CM

## 2013-03-28 DIAGNOSIS — N179 Acute kidney failure, unspecified: Secondary | ICD-10-CM

## 2013-03-28 DIAGNOSIS — I2589 Other forms of chronic ischemic heart disease: Secondary | ICD-10-CM | POA: Diagnosis present

## 2013-03-28 DIAGNOSIS — G4700 Insomnia, unspecified: Secondary | ICD-10-CM

## 2013-03-28 DIAGNOSIS — Z96659 Presence of unspecified artificial knee joint: Secondary | ICD-10-CM

## 2013-03-28 DIAGNOSIS — I4729 Other ventricular tachycardia: Secondary | ICD-10-CM

## 2013-03-28 DIAGNOSIS — F329 Major depressive disorder, single episode, unspecified: Secondary | ICD-10-CM

## 2013-03-28 DIAGNOSIS — J440 Chronic obstructive pulmonary disease with acute lower respiratory infection: Secondary | ICD-10-CM | POA: Diagnosis present

## 2013-03-28 DIAGNOSIS — I472 Ventricular tachycardia: Secondary | ICD-10-CM

## 2013-03-28 DIAGNOSIS — I5043 Acute on chronic combined systolic (congestive) and diastolic (congestive) heart failure: Principal | ICD-10-CM

## 2013-03-28 DIAGNOSIS — M25569 Pain in unspecified knee: Secondary | ICD-10-CM

## 2013-03-28 DIAGNOSIS — R079 Chest pain, unspecified: Secondary | ICD-10-CM

## 2013-03-28 DIAGNOSIS — J209 Acute bronchitis, unspecified: Secondary | ICD-10-CM

## 2013-03-28 DIAGNOSIS — C679 Malignant neoplasm of bladder, unspecified: Secondary | ICD-10-CM

## 2013-03-28 DIAGNOSIS — R0602 Shortness of breath: Secondary | ICD-10-CM

## 2013-03-28 DIAGNOSIS — M1990 Unspecified osteoarthritis, unspecified site: Secondary | ICD-10-CM

## 2013-03-28 DIAGNOSIS — I5031 Acute diastolic (congestive) heart failure: Secondary | ICD-10-CM

## 2013-03-28 DIAGNOSIS — I2119 ST elevation (STEMI) myocardial infarction involving other coronary artery of inferior wall: Secondary | ICD-10-CM

## 2013-03-28 DIAGNOSIS — N138 Other obstructive and reflux uropathy: Secondary | ICD-10-CM

## 2013-03-28 DIAGNOSIS — F32A Depression, unspecified: Secondary | ICD-10-CM

## 2013-03-28 DIAGNOSIS — I359 Nonrheumatic aortic valve disorder, unspecified: Secondary | ICD-10-CM | POA: Diagnosis present

## 2013-03-28 DIAGNOSIS — I959 Hypotension, unspecified: Secondary | ICD-10-CM

## 2013-03-28 DIAGNOSIS — H6120 Impacted cerumen, unspecified ear: Secondary | ICD-10-CM

## 2013-03-28 LAB — BASIC METABOLIC PANEL
BUN: 22 mg/dL (ref 6–23)
CALCIUM: 8.6 mg/dL (ref 8.4–10.5)
CO2: 27 mEq/L (ref 19–32)
CREATININE: 1.38 mg/dL — AB (ref 0.50–1.35)
Chloride: 91 mEq/L — ABNORMAL LOW (ref 96–112)
GFR calc Af Amer: 49 mL/min — ABNORMAL LOW (ref >90)
GFR calc non Af Amer: 42 mL/min — ABNORMAL LOW (ref >90)
Glucose, Bld: 126 mg/dL — ABNORMAL HIGH (ref 70–99)
Potassium: 3.6 mEq/L — ABNORMAL LOW (ref 3.7–5.3)
SODIUM: 131 meq/L — AB (ref 137–147)

## 2013-03-28 LAB — D-DIMER, QUANTITATIVE (NOT AT ARMC): D-Dimer, Quant: 1.19 ug/mL-FEU — ABNORMAL HIGH (ref 0.00–0.48)

## 2013-03-28 LAB — EXPECTORATED SPUTUM ASSESSMENT W GRAM STAIN, RFLX TO RESP C

## 2013-03-28 LAB — RESPIRATORY VIRUS PANEL
Adenovirus: NOT DETECTED
Influenza A H1: NOT DETECTED
Influenza A H3: NOT DETECTED
Influenza A: NOT DETECTED
Influenza B: NOT DETECTED
Metapneumovirus: NOT DETECTED
Parainfluenza 1: NOT DETECTED
Parainfluenza 2: NOT DETECTED
Parainfluenza 3: NOT DETECTED
Respiratory Syncytial Virus A: NOT DETECTED
Respiratory Syncytial Virus B: NOT DETECTED
Rhinovirus: NOT DETECTED

## 2013-03-28 LAB — CBC
HCT: 36.6 % — ABNORMAL LOW (ref 39.0–52.0)
Hemoglobin: 12.9 g/dL — ABNORMAL LOW (ref 13.0–17.0)
MCH: 33.5 pg (ref 26.0–34.0)
MCHC: 35.2 g/dL (ref 30.0–36.0)
MCV: 95.1 fL (ref 78.0–100.0)
Platelets: 193 10*3/uL (ref 150–400)
RBC: 3.85 MIL/uL — AB (ref 4.22–5.81)
RDW: 12.6 % (ref 11.5–15.5)
WBC: 8.9 10*3/uL (ref 4.0–10.5)

## 2013-03-28 LAB — STREP PNEUMONIAE URINARY ANTIGEN: Strep Pneumo Urinary Antigen: NEGATIVE

## 2013-03-28 LAB — POCT I-STAT TROPONIN I: TROPONIN I, POC: 0.06 ng/mL (ref 0.00–0.08)

## 2013-03-28 LAB — EXPECTORATED SPUTUM ASSESSMENT W REFEX TO RESP CULTURE

## 2013-03-28 MED ORDER — PREDNISONE 50 MG PO TABS
50.0000 mg | ORAL_TABLET | Freq: Every day | ORAL | Status: DC
  Administered 2013-03-28 – 2013-04-01 (×5): 50 mg via ORAL
  Filled 2013-03-28 (×6): qty 1

## 2013-03-28 MED ORDER — FLUTICASONE PROPIONATE 50 MCG/ACT NA SUSP
2.0000 | Freq: Every day | NASAL | Status: DC
  Administered 2013-03-28 – 2013-04-01 (×5): 2 via NASAL
  Filled 2013-03-28: qty 16

## 2013-03-28 MED ORDER — DILTIAZEM HCL ER COATED BEADS 180 MG PO CP24
180.0000 mg | ORAL_CAPSULE | Freq: Every day | ORAL | Status: DC
  Administered 2013-03-28 – 2013-04-01 (×5): 180 mg via ORAL
  Filled 2013-03-28 (×5): qty 1

## 2013-03-28 MED ORDER — LATANOPROST 0.005 % OP SOLN
1.0000 [drp] | Freq: Every day | OPHTHALMIC | Status: DC
  Administered 2013-03-28 – 2013-03-31 (×4): 1 [drp] via OPHTHALMIC
  Filled 2013-03-28: qty 2.5

## 2013-03-28 MED ORDER — FUROSEMIDE 40 MG PO TABS
40.0000 mg | ORAL_TABLET | Freq: Two times a day (BID) | ORAL | Status: DC
  Administered 2013-03-28 – 2013-04-01 (×10): 40 mg via ORAL
  Filled 2013-03-28 (×11): qty 1

## 2013-03-28 MED ORDER — TRAMADOL HCL 50 MG PO TABS
50.0000 mg | ORAL_TABLET | Freq: Four times a day (QID) | ORAL | Status: DC | PRN
Start: 2013-03-28 — End: 2013-04-02
  Administered 2013-04-01: 50 mg via ORAL
  Filled 2013-03-28: qty 1

## 2013-03-28 MED ORDER — TECHNETIUM TC 99M DIETHYLENETRIAME-PENTAACETIC ACID: 40.0000 | Freq: Once | INTRAVENOUS | Status: AC | PRN

## 2013-03-28 MED ORDER — IPRATROPIUM-ALBUTEROL 0.5-2.5 (3) MG/3ML IN SOLN: 3.0000 mL | Freq: Four times a day (QID) | RESPIRATORY_TRACT | Status: DC | PRN

## 2013-03-28 MED ORDER — METHYLPREDNISOLONE SODIUM SUCC 125 MG IJ SOLR
125.0000 mg | Freq: Once | INTRAMUSCULAR | Status: AC
  Administered 2013-03-28: 125 mg via INTRAVENOUS
  Filled 2013-03-28: qty 2

## 2013-03-28 MED ORDER — TAMSULOSIN HCL 0.4 MG PO CAPS
0.4000 mg | ORAL_CAPSULE | Freq: Once | ORAL | Status: AC
  Administered 2013-03-28: 0.4 mg via ORAL
  Filled 2013-03-28: qty 1

## 2013-03-28 MED ORDER — TAMSULOSIN HCL 0.4 MG PO CAPS
0.4000 mg | ORAL_CAPSULE | Freq: Every day | ORAL | Status: DC
  Administered 2013-03-28: 0.4 mg via ORAL
  Filled 2013-03-28: qty 1

## 2013-03-28 MED ORDER — ESCITALOPRAM OXALATE 20 MG PO TABS
20.0000 mg | ORAL_TABLET | Freq: Every day | ORAL | Status: DC
  Administered 2013-03-28 – 2013-04-01 (×5): 20 mg via ORAL
  Filled 2013-03-28 (×5): qty 1

## 2013-03-28 MED ORDER — ALBUTEROL SULFATE (2.5 MG/3ML) 0.083% IN NEBU: 2.5000 mg | INHALATION_SOLUTION | RESPIRATORY_TRACT | Status: DC | PRN

## 2013-03-28 MED ORDER — HEPARIN SODIUM (PORCINE) 5000 UNIT/ML IJ SOLN
5000.0000 [IU] | Freq: Three times a day (TID) | INTRAMUSCULAR | Status: DC
  Administered 2013-03-28 – 2013-04-01 (×14): 5000 [IU] via SUBCUTANEOUS
  Filled 2013-03-28 (×17): qty 1

## 2013-03-28 MED ORDER — ENSURE COMPLETE PO LIQD
120.0000 mL | Freq: Two times a day (BID) | ORAL | Status: DC
  Administered 2013-03-28 – 2013-04-01 (×9): 120 mL via ORAL

## 2013-03-28 MED ORDER — IPRATROPIUM-ALBUTEROL 0.5-2.5 (3) MG/3ML IN SOLN
3.0000 mL | RESPIRATORY_TRACT | Status: DC
  Administered 2013-03-28 (×3): 3 mL via RESPIRATORY_TRACT
  Filled 2013-03-28 (×4): qty 3

## 2013-03-28 MED ORDER — ASPIRIN 81 MG PO CHEW
81.0000 mg | CHEWABLE_TABLET | Freq: Every day | ORAL | Status: DC
  Administered 2013-03-28 – 2013-04-01 (×5): 81 mg via ORAL
  Filled 2013-03-28 (×5): qty 1

## 2013-03-28 MED ORDER — ATORVASTATIN CALCIUM 40 MG PO TABS
40.0000 mg | ORAL_TABLET | Freq: Every day | ORAL | Status: DC
  Administered 2013-03-28 – 2013-04-01 (×5): 40 mg via ORAL
  Filled 2013-03-28 (×5): qty 1

## 2013-03-28 MED ORDER — CLOPIDOGREL BISULFATE 75 MG PO TABS
75.0000 mg | ORAL_TABLET | Freq: Every day | ORAL | Status: DC
  Administered 2013-03-28 – 2013-04-01 (×5): 75 mg via ORAL
  Filled 2013-03-28 (×6): qty 1

## 2013-03-28 MED ORDER — LEVOFLOXACIN IN D5W 750 MG/150ML IV SOLN
750.0000 mg | INTRAVENOUS | Status: DC
  Administered 2013-03-30: 750 mg via INTRAVENOUS
  Filled 2013-03-28: qty 150

## 2013-03-28 MED ORDER — POLYETHYLENE GLYCOL 3350 17 G PO PACK
17.0000 g | PACK | Freq: Every day | ORAL | Status: DC | PRN
  Filled 2013-03-28: qty 1

## 2013-03-28 MED ORDER — IPRATROPIUM-ALBUTEROL 0.5-2.5 (3) MG/3ML IN SOLN
3.0000 mL | Freq: Four times a day (QID) | RESPIRATORY_TRACT | Status: DC
  Administered 2013-03-28: 3 mL via RESPIRATORY_TRACT
  Filled 2013-03-28: qty 3

## 2013-03-28 MED ORDER — BUDESONIDE-FORMOTEROL FUMARATE 160-4.5 MCG/ACT IN AERO
2.0000 | INHALATION_SPRAY | Freq: Two times a day (BID) | RESPIRATORY_TRACT | Status: DC
  Administered 2013-03-28 – 2013-04-01 (×9): 2 via RESPIRATORY_TRACT
  Filled 2013-03-28: qty 6

## 2013-03-28 MED ORDER — GABAPENTIN 100 MG PO CAPS
100.0000 mg | ORAL_CAPSULE | Freq: Three times a day (TID) | ORAL | Status: DC
Start: 2013-03-28 — End: 2013-04-02
  Administered 2013-03-28 – 2013-04-01 (×14): 100 mg via ORAL
  Filled 2013-03-28 (×16): qty 1

## 2013-03-28 MED ORDER — LEVOFLOXACIN IN D5W 750 MG/150ML IV SOLN
750.0000 mg | Freq: Once | INTRAVENOUS | Status: AC
  Administered 2013-03-28: 750 mg via INTRAVENOUS
  Filled 2013-03-28: qty 150

## 2013-03-28 MED ORDER — IPRATROPIUM-ALBUTEROL 0.5-2.5 (3) MG/3ML IN SOLN
3.0000 mL | Freq: Four times a day (QID) | RESPIRATORY_TRACT | Status: DC
Start: 2013-03-29 — End: 2013-03-30
  Administered 2013-03-29 (×3): 3 mL via RESPIRATORY_TRACT
  Filled 2013-03-28 (×3): qty 3

## 2013-03-28 MED ORDER — TECHNETIUM TO 99M ALBUMIN AGGREGATED: 5.0000 | Freq: Once | INTRAVENOUS | Status: AC | PRN

## 2013-03-28 MED ORDER — TAMSULOSIN HCL 0.4 MG PO CAPS
0.8000 mg | ORAL_CAPSULE | Freq: Every day | ORAL | Status: DC
  Administered 2013-03-29 – 2013-04-01 (×4): 0.8 mg via ORAL
  Filled 2013-03-28 (×4): qty 2

## 2013-03-28 NOTE — ED Notes (Signed)
Pt returned from xray

## 2013-03-28 NOTE — ED Provider Notes (Signed)
CSN: [id number]     Arrival date & time 03/28/13  0127 History   First MD Initiated Contact with Patient 03/28/13 0129     Chief Complaint  Patient presents with  . Cough   (Consider location/radiation/quality/duration/timing/severity/associated sxs/prior Treatment) HPI 80 yo male presents to the ER from home via EMS with complaint of low O2 sats and cough.  Pt was noted this afternoon to have intermittent low O2 sats into 80's.  Tonight around midnight he began to have cough productive of thick white ropy secretions.  No fevers.  Pt with h/o COPD, s/p MI 4/14, and mult pna in 2013.  Pt received combivent for wheezing from EMS.  Home health aide gave spiriva tonight with albuterol neb without improvement.   Past Medical History  Diagnosis Date  . Aortic valve stenosis, moderate     last echo April 4014   . Coronary artery disease     s/p PCI in 1995; Cath 2005 showed moderate 2 vessel CAD involving the distal Left main & LCx/OM, treated medically  . Myocardial infarction   . Hyperlipidemia   . Hypertension   . Vitamin B12 deficiency   . Neuropathy   . Allergic rhinitis   . OA (osteoarthritis)   . History of bladder cancer   . GERD (gastroesophageal reflux disease)   . Esophageal stricture   . Chronic kidney disease     over active bladder  . BPH (benign prostatic hypertrophy)     URINARY RETENTION-HAS INDWELLING FOLEY CATHETER  . Memory loss     short term memory loss which pt denies   Past Surgical History  Procedure Laterality Date  . Coronary angioplasty    . Appendectomy    . Cholecystectomy    . Tonsillectomy    . Sigmoidoscopy  1999  . Colonoscopy    . Bilateral elbow surgery    . Left knee replacement      sees Dr. Gaynelle Arabian   . Carotid endarterectomy  08/2008  . Esophagogastroduodenoscopy      dilation  . Transurethral resection of prostate N/A 04/25/2012    Procedure: TRANSURETHRAL RESECTION OF THE PROSTATE WITH GYRUS INSTRUMENTS;  Surgeon: Alexis Frock,  MD;  Location: [facility];  Service: Urology;  Laterality: N/A;  . Cystoscopy with biopsy N/A 04/25/2012    Procedure: CYSTOSCOPY WITH BIOPSY;  Surgeon: Alexis Frock, MD;  Location: [facility];  Service: Urology;  Laterality: N/A;   Family History  Problem Relation Age of Onset  . Coronary artery disease Brother   . Prostate cancer      first degree relative   History  Substance Use Topics  . Smoking status: Former Smoker -- 1.00 packs/day for 10 years    Quit date: 03/14/1950  . Smokeless tobacco: Never Used  . Alcohol Use: No    Review of Systems  Unable to perform ROS: Other  Pt HOH  Allergies  Sulfamethoxazole  Home Medications   Current Outpatient Rx  Name  Route  Sig  Dispense  Refill  . albuterol (PROVENTIL) (2.5 MG/3ML) 0.083% nebulizer solution   Nebulization   Take 3 mLs (2.5 mg total) by nebulization every 4 (four) hours as needed for wheezing or shortness of breath.   75 mL   11   . aspirin 81 MG chewable tablet   Oral   Chew 1 tablet (81 mg total) by mouth daily.         Marland Kitchen atorvastatin (LIPITOR) 40 MG tablet   Oral  Take 1 tablet (40 mg total) by mouth daily.   90 tablet   1   . budesonide-formoterol (SYMBICORT) 160-4.5 MCG/ACT inhaler   Inhalation   Inhale 2 puffs into the lungs 2 (two) times daily.   3 Inhaler   3   . CARDIZEM CD 180 MG 24 hr capsule      TAKE 1 CAPSULE DAILY   90 capsule   2   . clopidogrel (PLAVIX) 75 MG tablet   Oral   Take 1 tablet (75 mg total) by mouth daily with breakfast.   90 tablet   3   . escitalopram (LEXAPRO) 10 MG tablet   Oral   Take 20 mg by mouth daily.         . feeding supplement (ENSURE COMPLETE) LIQD   Oral   Take 120 mLs by mouth 2 (two) times daily between meals.         . fluticasone (FLONASE) 50 MCG/ACT nasal spray      USE 2 SPRAYS NASALLY DAILY   48 g   3   . furosemide (LASIX) 40 MG tablet   Oral   Take 1 tablet (40 mg total) by mouth 2 (two) times daily as needed.   60 tablet    3   . gabapentin (NEURONTIN) 100 MG capsule   Oral   Take 1 capsule (100 mg total) by mouth 3 (three) times daily.   180 capsule   3   . latanoprost (XALATAN) 0.005 % ophthalmic solution   Both Eyes   Place 1 drop into both eyes at bedtime.   7.5 mL   3   . Multiple Vitamin (MULTIVITAMIN WITH MINERALS) TABS   Oral   Take 1 tablet by mouth daily.         Marland Kitchen NITROSTAT 0.4 MG SL tablet      place 1 tablet by mouth under the tongue every 5 minutes if needed   25 tablet   0   . polyethylene glycol (MIRALAX / GLYCOLAX) packet   Oral   Take 17 g by mouth daily as needed (constipation).   14 each      . sennosides-docusate sodium (SENOKOT-S) 8.6-50 MG tablet   Oral   Take 1 tablet by mouth 2 (two) times daily. While taking pain meds to prevent constipation.   30 tablet   0   . tamsulosin (FLOMAX) 0.4 MG CAPS capsule      take 1 capsule by mouth at bedtime   30 capsule   1   . temazepam (RESTORIL) 30 MG capsule   Oral   Take 1 capsule (30 mg total) by mouth at bedtime as needed for sleep.   90 capsule   1   . traMADol (ULTRAM) 50 MG tablet   Oral   Take 1 tablet (50 mg total) by mouth every 6 (six) hours as needed for pain.   360 tablet   3   . trolamine salicylate (ASPERCREME) 10 % cream   Topical   Apply 1 application topically 4 (four) times daily -  before meals and at bedtime.          BP 124/67  Pulse 88  Temp(Src) 98.2 F (36.8 C) (Oral)  Resp 24  SpO2 93% Physical Exam  Nursing note and vitals reviewed. Constitutional: He is oriented to person, place, and time. He appears well-developed and well-nourished.  Elderly frail male, NAD.  Initial sats on RA 83%  HENT:  Head: Normocephalic and  atraumatic.  Nose: Nose normal.  Mouth/Throat: Oropharynx is clear and moist.  Eyes: Conjunctivae and EOM are normal. Pupils are equal, round, and reactive to light.  Neck: Normal range of motion. Neck supple. No JVD present. No tracheal deviation present.  No thyromegaly present.  Cardiovascular: Normal rate, regular rhythm, normal heart sounds and intact distal pulses.  Exam reveals no gallop and no friction rub.   No murmur heard. Pulmonary/Chest: Effort normal. No stridor. No respiratory distress. He has wheezes. He has no rales. He exhibits no tenderness.  cough  Abdominal: Soft. Bowel sounds are normal. He exhibits no distension and no mass. There is no tenderness. There is no rebound and no guarding.  Musculoskeletal: Normal range of motion. He exhibits no edema and no tenderness.  Lymphadenopathy:    He has no cervical adenopathy.  Neurological: He is alert and oriented to person, place, and time. He exhibits normal muscle tone. Coordination normal.  Skin: Skin is warm and dry. No rash noted. No erythema. No pallor.  Psychiatric: He has a normal mood and affect. His behavior is normal. Judgment and thought content normal.    ED Course  Procedures (including critical care time) Labs Review Labs Reviewed  BASIC METABOLIC PANEL - Abnormal; Notable for the following:    Sodium 131 (*)    Potassium 3.6 (*)    Chloride 91 (*)    Glucose, Bld 126 (*)    Creatinine, Ser 1.38 (*)    GFR calc non Af Amer 42 (*)    GFR calc Af Amer 49 (*)    All other components within normal limits  CBC - Abnormal; Notable for the following:    RBC 3.85 (*)    Hemoglobin 12.9 (*)    HCT 36.6 (*)    All other components within normal limits  RESPIRATORY VIRUS PANEL  POCT I-STAT TROPONIN I   Imaging Review Dg Chest 2 View (if Patient Has Fever And/or Copd)  03/28/2013   CLINICAL DATA:  Cough.  EXAM: CHEST  2 VIEW  COMPARISON:  06/21/2012  FINDINGS: Cardiomegaly which is chronic. There is aortic tortuosity. Lungs are better aerated than previously. There is increased markings at the bases which is likely from hypoaeration. No effusion or pneumothorax. No acute osseous findings.  IMPRESSION: Mildly low lung volumes.  No edema or consolidation.    Electronically Signed   By: Jorje Guild M.D.   On: 03/28/2013 02:19    EKG Interpretation    Date/Time:  Thursday March 28 2013 01:39:48 EST Ventricular Rate:  85 PR Interval:  174 QRS Duration: 90 QT Interval:  407 QTC Calculation: 484 R Axis:   10 Text Interpretation:  Sinus rhythm Repol abnrm suggests ischemia, diffuse leads Baseline wander in lead(s) V1 ST depression seen on prior ekgs, t wave inversions appear improved form prior Confirmed by Bodey Frizell  MD, Toa Mia ([id number]) on 03/28/2013 2:02:18 AM            MDM   1. Hypoxia   2. Bronchitis   3. COPD (chronic obstructive pulmonary disease)    80 yo male with cough, hypoxia.  Differential includes pna, COPD exacerbation, bronchitis, atypical nstemi presentation.  Will get labs, cxr.  Wheezing much improved after breathing tx by EMS, will give steroids.    Kalman Drape, MD 03/28/13 [phone]

## 2013-03-28 NOTE — Progress Notes (Addendum)
[facility] PROGRESS NOTE  Thomas [name] TIR:[id number] DOB: [birth year] DOA: 03/28/2013 PCP: [name], MD  Assessment/Plan  Acute hypoxic respiratory failure, likely secondary to acute COPD exacerbation.  Also consider heart failure, although no evidence of congestion on CXR.   -  CXR clear and no evidence of ventilatory defect on VQ -  D-dimer > age threshold of 940, but VQ neg for PE -  Continue steroids, duonebs and abx  Hypertension and aortic stenosis with ischemic cardiomyopathy  - telemetry:  NSR, okay to d/c -  Continue ASA, plavix, statin, cardizem -  Check pro-bNP with AM labs  Chronic kidney disease, stable.  minimize nephrotoxic medications.  Generalized weakness -  PT/OT  Acute urinary retention, may be due to atrovent -  Increase flomax -  Foley catheter for now.  Will d/c foley and do voiding trial prior to discharge  Diet:  Dysphagia 2 with thin Access:  PIV IVF:  off Proph:  heparin  Code Status: full Family Communication: patient alone Disposition Plan: pending improvement in dyspnea   Consultants:  none  Procedures:  VQ  CXR  Antibiotics:  Levofloxacin 1/15 >>   HPI/Subjective:  Continues to feel SOB and have cough.  Denies cp    Objective: Filed Vitals:   03/28/13 1044 03/28/13 1527 03/28/13 1955 03/28/13 1956  BP: 128/73 145/73    Pulse:  86 72   Temp:  97.7 F (36.5 C)    TempSrc:  Oral    Resp:  18 18   SpO2:  98% 97% 97%    Intake/Output Summary (Last 24 hours) at 03/28/13 2007 Last data filed at 03/28/13 1820  Gross per 24 hour  Intake    480 ml  Output    250 ml  Net    230 ml   There were no vitals filed for this visit.  Exam:   General:  CM, No acute distress  HEENT:  NCAT, MMM  Cardiovascular:  RRR, nl S1, S2 2/6 systolic murmur, no rubs or gallops, 2+ pulses, warm extremities  Respiratory:  Diminished bilateral BS without focal rales or rhonchi, no increased WOB  Abdomen:   NABS, soft,  NT/ND  MSK:   Normal tone and bulk, no LEE  Neuro:  Grossly intact, slow to answer questions  Data Reviewed: Basic Metabolic Panel:  Recent Labs Lab 03/28/13 0157  NA 131*  K 3.6*  CL 91*  CO2 27  GLUCOSE 126*  BUN 22  CREATININE 1.38*  CALCIUM 8.6   Liver Function Tests: No results found for this basename: AST, ALT, ALKPHOS, BILITOT, PROT, ALBUMIN,  in the last 168 hours No results found for this basename: LIPASE, AMYLASE,  in the last 168 hours No results found for this basename: AMMONIA,  in the last 168 hours CBC:  Recent Labs Lab 03/28/13 0157  WBC 8.9  HGB 12.9*  HCT 36.6*  MCV 95.1  PLT 193   Cardiac Enzymes: No results found for this basename: CKTOTAL, CKMB, CKMBINDEX, TROPONINI,  in the last 168 hours BNP (last 3 results)  Recent Labs  06/21/12 0913  PROBNP 8040.0*   CBG: No results found for this basename: GLUCAP,  in the last 168 hours  Recent Results (from the past 240 hour(s))  CULTURE, EXPECTORATED SPUTUM-ASSESSMENT     Status: None   Collection Time    03/28/13 11:27 AM      Result Value Range Status   Specimen Description SPUTUM   Final   Special Requests  NONE   Final   Sputum evaluation     Final   Value: THIS SPECIMEN IS ACCEPTABLE. RESPIRATORY CULTURE REPORT TO FOLLOW.   Report Status 03/28/2013 FINAL   Final     Studies: Dg Chest 2 View (if Patient Has Fever And/or Copd)  03/28/2013   CLINICAL DATA:  Cough.  EXAM: CHEST  2 VIEW  COMPARISON:  06/21/2012  FINDINGS: Cardiomegaly which is chronic. There is aortic tortuosity. Lungs are better aerated than previously. There is increased markings at the bases which is likely from hypoaeration. No effusion or pneumothorax. No acute osseous findings.  IMPRESSION: Mildly low lung volumes.  No edema or consolidation.   Electronically Signed   By: Jorje Guild M.D.   On: 03/28/2013 02:19   Nm Pulmonary Perf And Vent  03/28/2013   CLINICAL DATA:  Hypoxia.  Elevated D-dimer.  EXAM: NUCLEAR  MEDICINE VENTILATION - PERFUSION LUNG SCAN  TECHNIQUE: Ventilation images were obtained in multiple projections using inhaled aerosol technetium 99 M DTPA. Perfusion images were obtained in multiple projections after intravenous injection of Tc-37m MAA.  COMPARISON:  PA and lateral chest 03/28/2013.  RADIOPHARMACEUTICALS:  40 mCi Tc-43m DTPA aerosol and 5 mCi Tc-64m MAA  FINDINGS: Ventilation: No focal ventilation defect.  Perfusion: No wedge shaped peripheral perfusion defects to suggest acute pulmonary embolism.  IMPRESSION: Negative for pulmonary embolus.   Electronically Signed   By: Inge Rise M.D.   On: 03/28/2013 17:05    Scheduled Meds: . aspirin  81 mg Oral Daily  . atorvastatin  40 mg Oral Daily  . budesonide-formoterol  2 puff Inhalation BID  . clopidogrel  75 mg Oral Q breakfast  . diltiazem  180 mg Oral Daily  . escitalopram  20 mg Oral Daily  . feeding supplement (ENSURE COMPLETE)  120 mL Oral BID BM  . fluticasone  2 spray Each Nare Daily  . furosemide  40 mg Oral BID  . gabapentin  100 mg Oral TID  . heparin  5,000 Units Subcutaneous Q8H  . ipratropium-albuterol  3 mL Nebulization Q4H  . latanoprost  1 drop Both Eyes QHS  . [START ON 03/30/2013] levofloxacin (LEVAQUIN) IV  750 mg Intravenous Q48H  . predniSONE  50 mg Oral Q breakfast  . tamsulosin  0.4 mg Oral Once  . [START ON 03/29/2013] tamsulosin  0.8 mg Oral QPC supper   Continuous Infusions:   Principal Problem:   CAP (community acquired pneumonia) Active Problems:   HYPERLIPIDEMIA   HYPERTENSION   Aortic stenosis, moderate   Acute bronchitis   COPD (chronic obstructive pulmonary disease)   CKD (chronic kidney disease) stage 3, GFR 30-59 ml/min   Cardiomyopathy, ischemic    Time spent: 30 min    Jolena Kittle, [facility] Pager [phone]. If 7PM-7AM, please contact night-coverage at [email or web address], password [facility] 03/28/2013, 8:07 PM  LOS: 0 days

## 2013-03-28 NOTE — H&P (Signed)
[facility] History and Physical  Patient: Thomas [name]  [id number]  DOB: 14-Jul-1916  DOS: the patient was seen and examined on 03/28/2013 PCP: [name], MD  Chief Complaint: Cough  HPI: Thomas [name] is a 80 y.o. male with Past medical history of aortic stenosis, coronary artery disease, dyslipidemia, hypertension, chronic kidney disease. The patient is coming from home. The patient was brought in by his wife and caregiver mentions that since last 2 days he has been having increasingly worsening shortness of breath on exertion and this was associated with increasing cough. The cough was yellow-green expectoration. The patient did not have any fever or chills. Patient denied any chest pain or palpitation. He also denied any complaint of leg swelling, nausea, vomiting, abdominal pain, aspiration. No diarrhea. He was on Toprol which was stopped earlier this month since his heart rate was in the 50s but other than that there is no change in his medication. Patient denies any dizziness lightheadedness vertigo fall syncope focal neurological deficit.  Review of Systems: as mentioned in the history of present illness.  A Comprehensive review of the other systems is negative.  Past Medical History  Diagnosis Date  . Aortic valve stenosis, moderate     last echo April 4014   . Coronary artery disease     s/p PCI in 1995; Cath 2005 showed moderate 2 vessel CAD involving the distal Left main & LCx/OM, treated medically  . Myocardial infarction   . Hyperlipidemia   . Hypertension   . Vitamin B12 deficiency   . Neuropathy   . Allergic rhinitis   . OA (osteoarthritis)   . History of bladder cancer   . GERD (gastroesophageal reflux disease)   . Esophageal stricture   . Chronic kidney disease     over active bladder  . BPH (benign prostatic hypertrophy)     URINARY RETENTION-HAS INDWELLING FOLEY CATHETER  . Memory loss     short term memory loss which pt denies   Past  Surgical History  Procedure Laterality Date  . Coronary angioplasty    . Appendectomy    . Cholecystectomy    . Tonsillectomy    . Sigmoidoscopy  1999  . Colonoscopy    . Bilateral elbow surgery    . Left knee replacement      sees Dr. Gaynelle Arabian   . Carotid endarterectomy  08/2008  . Esophagogastroduodenoscopy      dilation  . Transurethral resection of prostate N/A 04/25/2012    Procedure: TRANSURETHRAL RESECTION OF THE PROSTATE WITH GYRUS INSTRUMENTS;  Surgeon: Alexis Frock, MD;  Location: [facility];  Service: Urology;  Laterality: N/A;  . Cystoscopy with biopsy N/A 04/25/2012    Procedure: CYSTOSCOPY WITH BIOPSY;  Surgeon: Alexis Frock, MD;  Location: [facility];  Service: Urology;  Laterality: N/A;   Social History:  reports that he quit smoking about 63 years ago. He has never used smokeless tobacco. He reports that he does not drink alcohol or use illicit drugs. Independent for most of his  ADL.  Allergies  Allergen Reactions  . Sulfamethoxazole     REACTION: unspecified    Family History  Problem Relation Age of Onset  . Coronary artery disease Brother   . Prostate cancer      first degree relative    Prior to Admission medications   Medication Sig Start Date End Date Taking? Authorizing Provider  albuterol (PROVENTIL) (2.5 MG/3ML) 0.083% nebulizer solution Take 3 mLs (2.5 mg total) by nebulization  every 4 (four) hours as needed for wheezing or shortness of breath. 12/17/12  Yes [name], MD  aspirin 81 MG chewable tablet Chew 1 tablet (81 mg total) by mouth daily. 06/26/12  Yes Rhonda G Barrett, PA-C  atorvastatin (LIPITOR) 40 MG tablet Take 1 tablet (40 mg total) by mouth daily. 12/04/12  Yes [name], MD  budesonide-formoterol [facility]) 160-4.5 MCG/ACT inhaler Inhale 2 puffs into the lungs 2 (two) times daily. 11/06/12  Yes [name], MD  CARDIZEM CD 180 MG 24 hr capsule TAKE 1 CAPSULE DAILY 08/20/12  Yes [name], MD  clopidogrel (PLAVIX) 75 MG tablet  Take 1 tablet (75 mg total) by mouth daily with breakfast. 09/07/12  Yes [name], MD  escitalopram (LEXAPRO) 10 MG tablet Take 20 mg by mouth daily. 06/21/12  Yes [name], MD  feeding supplement (ENSURE COMPLETE) LIQD Take 120 mLs by mouth 2 (two) times daily between meals. 05/11/12  Yes Eugenie Filler, MD  fluticasone [facility]) 50 MCG/ACT nasal spray USE 2 SPRAYS NASALLY DAILY 12/03/12  Yes [name], MD  furosemide (LASIX) 40 MG tablet Take 40 mg by mouth 2 (two) times daily.   Yes Historical Provider, MD  gabapentin (NEURONTIN) 100 MG capsule Take 1 capsule (100 mg total) by mouth 3 (three) times daily. 11/26/12  Yes [name], MD  latanoprost (XALATAN) 0.005 % ophthalmic solution Place 1 drop into both eyes at bedtime. 09/10/12  Yes [name], MD  Multiple Vitamin (MULTIVITAMIN WITH MINERALS) TABS Take 1 tablet by mouth daily.   Yes Historical Provider, MD  NITROSTAT 0.4 MG SL tablet place 1 tablet by mouth under the tongue every 5 minutes if needed 08/13/12  Yes [name], MD  polyethylene glycol (MIRALAX / GLYCOLAX) packet Take 17 g by mouth daily as needed (constipation). 06/26/12  Yes Rhonda G Barrett, PA-C  sennosides-docusate sodium (SENOKOT-S) 8.6-50 MG tablet Take 1 tablet by mouth 2 (two) times daily. While taking pain meds to prevent constipation. 04/26/12  Yes Alexis Frock, MD  tamsulosin [facility]) 0.4 MG CAPS capsule take 1 capsule by mouth at bedtime 02/11/13  Yes [name], MD  temazepam (RESTORIL) 30 MG capsule Take 1 capsule (30 mg total) by mouth at bedtime as needed for sleep. 12/05/12  Yes [name], MD  traMADol (ULTRAM) 50 MG tablet Take 1 tablet (50 mg total) by mouth every 6 (six) hours as needed for pain. 10/16/12  Yes [name], MD  trolamine salicylate (ASPERCREME) 10 % cream Apply 1 application topically 4 (four) times daily -  before meals and at bedtime.   Yes Historical Provider, MD    Physical Exam: Filed Vitals:   03/28/13 0300  03/28/13 0356 03/28/13 0443 03/28/13 0451  BP:  116/64  99/67  Pulse: 75 73  73  Temp:  99.1 F (37.3 C)    TempSrc:  Oral    Resp: 23 18  16   SpO2: 93% 94% 94% 95%    General: Alert, Awake and Oriented to Time, Place and Person. Appear in mild distress Eyes: PERRL ENT: Oral Mucosa clear moist. Neck: no JVD Cardiovascular: S1 and S2 Present, aortic systolic Murmur, Peripheral Pulses Present Respiratory: Bilateral Air entry equal and Decreased, right-sided Crackles, occasional wheezes Abdomen: Bowel Sound Present, Soft and Non tender Skin: no Rash Extremities: no Pedal edema, no calf tenderness Neurologic: Grossly Unremarkable.  Labs on Admission:  CBC:  Recent Labs Lab 03/28/13  0157  WBC 8.9  HGB 12.9*  HCT 36.6*  MCV 95.1  PLT 193    CMP     Component Value Date/Time   NA 131* 03/28/2013 0157   K 3.6* 03/28/2013 0157   CL 91* 03/28/2013 0157   CO2 27 03/28/2013 0157   GLUCOSE 126* 03/28/2013 0157   BUN 22 03/28/2013 0157   CREATININE 1.38* 03/28/2013 0157   CALCIUM 8.6 03/28/2013 0157   PROT 5.7* 06/22/2012 0525   ALBUMIN 2.7* 06/22/2012 0525   AST 64* 06/22/2012 0525   ALT 22 06/22/2012 0525   ALKPHOS 52 06/22/2012 0525   BILITOT 0.5 06/22/2012 0525   GFRNONAA 42* 03/28/2013 0157   GFRAA 49* 03/28/2013 0157    No results found for this basename: LIPASE, AMYLASE,  in the last 168 hours No results found for this basename: AMMONIA,  in the last 168 hours  No results found for this basename: CKTOTAL, CKMB, CKMBINDEX, TROPONINI,  in the last 168 hours BNP (last 3 results)  Recent Labs  06/21/12 0913  PROBNP 8040.0*    Radiological Exams on Admission: Dg Chest 2 View (if Patient Has Fever And/or Copd)  03/28/2013   CLINICAL DATA:  Cough.  EXAM: CHEST  2 VIEW  COMPARISON:  06/21/2012  FINDINGS: Cardiomegaly which is chronic. There is aortic tortuosity. Lungs are better aerated than previously. There is increased markings at the bases which is likely from hypoaeration.  No effusion or pneumothorax. No acute osseous findings.  IMPRESSION: Mildly low lung volumes.  No edema or consolidation.   Electronically Signed   By: Jorje Guild M.D.   On: 03/28/2013 02:19    EKG: Independently reviewed. normal sinus rhythm, nonspecific ST and T waves changes.  Assessment/Plan Principal Problem:   CAP (community acquired pneumonia) Active Problems:   HYPERLIPIDEMIA   HYPERTENSION   Aortic stenosis, moderate   Acute bronchitis   COPD (chronic obstructive pulmonary disease)   CKD (chronic kidney disease) stage 3, GFR 30-59 ml/min   Cardiomyopathy, ischemic   1. CAP (community acquired pneumonia)  the patient is presenting with complaints of cough with shortness of breath. On examination he has significant crackles on the right side with occasional wheezing. He is also hypoxic but although he does not have any fever or leukocytosis, this very well could reflect possible bronchitis versus pneumonia. Since it is right-sided this could also be aspiration pneumonia. For his hypoxia the patient does not have any evidence of leg swelling, immobilization, trauma, leg tenderness, I will check a d-dimer is since his pretest probability is very low for DVT.  2.Hypertension and aortic stenosis with ischemic cardiomyopathy Patient appears in sinus rhythm on telemetry EKG also does not show any acute abnormality. I will continue him on his home medications including Lasix.  3. COPD The patient does have history of COPD and he felt better with nebulizing treatment and IV Solu-Medrol. At present I would continue him on low dose oral prednisone and DuoNeb as as needed.  4. Chronic kidney disease Stable at present avoid nephrotoxic medications.  DVT Prophylaxis: subcutaneous Heparin Nutrition:  n.p.o. until speech evaluation to rule out aspiration  Code Status:  full  Family Communication:  Wife was present at bedside, opportunity was given to ask question and all questions  were answered satisfactorily at the time of interview. Disposition: Admitted to inpatient in med-surge unit.  Author: Berle Mull, MD [facility] Pager: [phone] 03/28/2013, 4:57 AM    If 7PM-7AM, please contact night-coverage [email or web address] Password  [email or web address]

## 2013-03-28 NOTE — ED Notes (Signed)
MD at bedside at this time.

## 2013-03-28 NOTE — Evaluation (Signed)
Clinical/Bedside Swallow Evaluation Patient Details  Name: Thomas [name] MRN: [id number] Date of Birth: 1917-01-08  Today's Date: 03/28/2013 Time: 1015-1055 SLP Time Calculation (min): 40 min  Past Medical History:  Past Medical History  Diagnosis Date  . Aortic valve stenosis, moderate     last echo April 4014   . Coronary artery disease     s/p PCI in 1995; Cath 2005 showed moderate 2 vessel CAD involving the distal Left main & LCx/OM, treated medically  . Myocardial infarction   . Hyperlipidemia   . Hypertension   . Vitamin B12 deficiency   . Neuropathy   . Allergic rhinitis   . OA (osteoarthritis)   . History of bladder cancer   . GERD (gastroesophageal reflux disease)   . Esophageal stricture   . Chronic kidney disease     over active bladder  . BPH (benign prostatic hypertrophy)     URINARY RETENTION-HAS INDWELLING FOLEY CATHETER  . Memory loss     short term memory loss which pt denies   Past Surgical History:  Past Surgical History  Procedure Laterality Date  . Coronary angioplasty    . Appendectomy    . Cholecystectomy    . Tonsillectomy    . Sigmoidoscopy  1999  . Colonoscopy    . Bilateral elbow surgery    . Left knee replacement      sees Dr. Gaynelle Arabian   . Carotid endarterectomy  08/2008  . Esophagogastroduodenoscopy      dilation  . Transurethral resection of prostate N/A 04/25/2012    Procedure: TRANSURETHRAL RESECTION OF THE PROSTATE WITH GYRUS INSTRUMENTS;  Surgeon: Alexis Frock, MD;  Location: [facility];  Service: Urology;  Laterality: N/A;  . Cystoscopy with biopsy N/A 04/25/2012    Procedure: CYSTOSCOPY WITH BIOPSY;  Surgeon: Alexis Frock, MD;  Location: [facility];  Service: Urology;  Laterality: N/A;   HPI:  Thomas [name] is a 80 y.o. male with Past medical history of aortic stenosis, coronary artery disease, dyslipidemia, hypertension, chronic kidney disease. The patient was brought in by his wife and caregiver mentions that since last 2  days he has been having increasingly worsening shortness of breath on exertion and this was associated with increasing cough. The cough was yellow-green expectoration. The patient did not have any fever or chills. Patient denied any chest pain or palpitation. He also denied any complaint of leg swelling, nausea, vomiting, abdominal pain, aspiration. No diarrhea.  He was on Toprol which was stopped earlier this month since his heart rate was in the 50s but other than that there is no change in his medication.  Patient denies any dizziness lightheadedness vertigo fall syncope focal neurological deficit.   OHY:WVPXTG low lung volumes. No edema or consolidation.  BSE indicated secondary to current respiratory status to assess risk for aspiration.        Assessment / Plan / Recommendation Clinical Impression  BSE completed.  Full assist to administer PO's due to present weakness.   No outward clnical s/s of aspiration noted with PO trials.  Oral prep phase affected due to lack of dentition.  Upper dentures present only.  Due to lack of dentition only recommend downgrade to dysphagia 2 ( finely chopped)  and continue thin liquids by cup sips only.   Full supervision and assist with all meals.  Recommend aspiration and reflux precautions due to current respiratory status and history significant for esophageal involvement. Diagnostic treatment completed following evaluation providing education to caregivers on diet  rec's and recommended aspiration precautions.    ST to follow briefly in acute care for diet tolerance and possible advancement.      Aspiration Risk  Mild    Diet Recommendation Dysphagia 2 (Fine chop);Thin liquid   Liquid Administration via: Cup;No straw Medication Administration: Whole meds with liquid Supervision: Staff to assist with self feeding;Full supervision/cueing for compensatory strategies Compensations: Slow rate;Small sips/bites;Follow solids with liquid Postural Changes  and/or Swallow Maneuvers: Seated upright 90 degrees;Upright 30-60 min after meal    Other  Recommendations Oral Care Recommendations: Oral care Q4 per protocol   Follow Up Recommendations   (TBD)    Frequency and Duration min 1 x/week  1 week       SLP Swallow Goals Please refer to Care Plans    Swallow Study Prior Functional Status   Lives at home with spouse on regular consistency and thin liquids    General Date of Onset: 03/28/13 HPI: Thomas [name] is a 80 y.o. male with Past medical history of aortic stenosis, coronary artery disease, dyslipidemia, hypertension, chronic kidney disease. Type of Study: Bedside swallow evaluation Diet Prior to this Study: Regular;Thin liquids Temperature Spikes Noted: No Respiratory Status: Nasal cannula History of Recent Intubation: No Behavior/Cognition: Alert;Cooperative;Pleasant mood;Hard of hearing Oral Cavity - Dentition: Dentures, top Self-Feeding Abilities: Needs set up;Needs assist;Able to feed self with adaptive devices Patient Positioning: Upright in bed Baseline Vocal Quality: Clear Volitional Cough: Strong Volitional Swallow: Able to elicit    Oral/Motor/Sensory Function Overall Oral Motor/Sensory Function: Appears within functional limits for tasks assessed   Ice Chips Ice chips: Not tested   Thin Liquid Thin Liquid: Within functional limits    Nectar Thick Nectar Thick Liquid: Not tested   Honey Thick Honey Thick Liquid: Not tested   Puree Puree: Within functional limits   Solid   GO    Solid: Impaired Oral Phase Impairments: Impaired anterior to posterior transit Other Comments: difficulty masticating solids secondary to lack of dentition      Sharman Crate Dobbins, CCC-SLP [phone] [facility] 03/28/2013,10:56 AM

## 2013-03-28 NOTE — ED Notes (Signed)
Hospitalist at bedside at this time 

## 2013-03-28 NOTE — Progress Notes (Signed)
UR completed 

## 2013-03-28 NOTE — Telephone Encounter (Signed)
[name] called and pt is in hospital now.

## 2013-03-28 NOTE — ED Notes (Signed)
Bed: EH21 Expected date: 03/28/13 Expected time: 1:15 AM Means of arrival: Ambulance Comments: Productive cough/ low o2 sats

## 2013-03-28 NOTE — ED Notes (Signed)
Called lab for virus panel collection device at this time

## 2013-03-28 NOTE — ED Notes (Signed)
Pt has had a productive cough for the past few days, clear sputum, hx COPD and recent pneumonia, was noted to have low oxygen sats at home when coughing at 80%, expiratory wheezing noted by EMS Combivent given en route.  Pt a&o x4, HOH and difficult to obtain hx, daughter en route.

## 2013-03-28 NOTE — ED Notes (Signed)
RT called for Duoneb treatment at this time

## 2013-03-29 DIAGNOSIS — I5031 Acute diastolic (congestive) heart failure: Secondary | ICD-10-CM

## 2013-03-29 LAB — LEGIONELLA ANTIGEN, URINE: Legionella Antigen, Urine: NEGATIVE

## 2013-03-29 LAB — PRO B NATRIURETIC PEPTIDE: Pro B Natriuretic peptide (BNP): 3684 pg/mL — ABNORMAL HIGH (ref 0–450)

## 2013-03-29 MED ORDER — POLYETHYLENE GLYCOL 3350 17 G PO PACK
17.0000 g | PACK | Freq: Every day | ORAL | Status: DC
  Administered 2013-03-29 – 2013-04-01 (×4): 17 g via ORAL
  Filled 2013-03-29 (×4): qty 1

## 2013-03-29 MED ORDER — SENNA 8.6 MG PO TABS
2.0000 | ORAL_TABLET | Freq: Every day | ORAL | Status: DC
  Administered 2013-03-29 – 2013-03-31 (×3): 17.2 mg via ORAL
  Filled 2013-03-29: qty 2

## 2013-03-29 MED ORDER — FUROSEMIDE 10 MG/ML IJ SOLN
40.0000 mg | Freq: Once | INTRAMUSCULAR | Status: AC
  Administered 2013-03-29: 40 mg via INTRAVENOUS
  Filled 2013-03-29: qty 4

## 2013-03-29 MED ORDER — DOCUSATE SODIUM 100 MG PO CAPS
100.0000 mg | ORAL_CAPSULE | Freq: Two times a day (BID) | ORAL | Status: DC
  Administered 2013-03-29 – 2013-04-01 (×6): 100 mg via ORAL
  Filled 2013-03-29 (×7): qty 1

## 2013-03-29 NOTE — Progress Notes (Signed)
Report received from [place] RN at this time. Patient resting in bed with no complaints of pain. Respirations are even and unlabored. Bed is in low position and call light in reach. Patient has a Actuary at the bedside. Patient states he sits with him at home.

## 2013-03-29 NOTE — Progress Notes (Signed)
Speech Language Pathology Treatment: Dysphagia  Patient Details Name: ZYRON DEELEY MRN: [id number] DOB: 1916/11/26 Today's Date: 03/29/2013 Time: 8381-8403 SLP Time Calculation (min): 35 min  Assessment / Plan / Recommendation Clinical Impression  Pt now has both upper and lower dentures in place.  Pt was given crackers with peanut butter, and juice.  No overt difficulty or s/s aspiration observed or reported.  Will recommend advancing diet to soft with chopped meats (for energy conservation), and thin liquids.  Pt and caregiver educated, precautions posted at [facility].  ST to sign off at this time. Please reconsult if needs arise.   HPI HPI: INMER NIX is a 80 y.o. male with Past medical history of aortic stenosis, coronary artery disease, dyslipidemia, hypertension, chronic kidney disease.   Pertinent Vitals VSS  SLP Plan  All goals met;Discharge SLP treatment due to (comment)    Recommendations Diet recommendations: Dysphagia 3 (mechanical soft);Thin liquid (chop meats (energy conservation)) Liquids provided via: Straw;Cup Medication Administration: Whole meds with liquid Supervision: Staff to assist with self feeding;Full supervision/cueing for compensatory strategies Compensations: Slow rate;Small sips/bites;Follow solids with liquid Postural Changes and/or Swallow Maneuvers: Seated upright 90 degrees;Upright 30-60 min after meal              Oral Care Recommendations: Oral care Q4 per protocol Follow up Recommendations: None Plan: All goals met;Discharge SLP treatment due to (comment)    GO    Celia B. Quentin [facility], CCC-SLP [phone]  Shonna Chock 03/29/2013, 2:24 PM

## 2013-03-29 NOTE — Evaluation (Signed)
Occupational Therapy Evaluation Patient Details Name: Thomas [name] MRN: [id number] DOB: May 14, 1916 Today's Date: 03/29/2013 Time: 5643-3295 OT Time Calculation (min): 26 min  OT Assessment / Plan / Recommendation History of present illness pt was admitted for CAP.  At baseline, he has private duty person who helps with all adls.  He performs SPT/squat to [facility].     Clinical Impression   Pt would benefit from skilled OT to increase independence with self feeding, improve balance and transfers to 3:1 commode.  Pt had extensive assistance at home before he was admitted with CAP    OT Assessment  Patient needs continued OT Services    Follow Up Recommendations  Supervision/Assistance - 24 hour;SNF (vs)    Barriers to Discharge      Equipment Recommendations       Recommendations for Other Services    Frequency  Min 2X/week    Precautions / Restrictions Precautions Precautions: Fall Restrictions Weight Bearing Restrictions: No   Pertinent Vitals/Pain No pain    ADL  Eating/Feeding: Moderate assistance (beverage due to tremor) Where Assessed - Eating/Feeding: Chair Toilet Transfer: Simulated;+2 Total assistance Toilet Transfer: Patient Percentage: 10% Transfers/Ambulation Related to ADLs: transferred to chair with A x 2, pt 10%.  He has a posterior lean ADL Comments: Pt has assist for all adls at baseline but performed transfers to 3:1 commode with assistance    OT Diagnosis: Generalized weakness  OT Problem List: Decreased strength;Decreased activity tolerance;Impaired balance (sitting and/or standing);Decreased cognition OT Treatment Interventions: Self-care/ADL training;Therapeutic activities;Patient/family education;Balance training   OT Goals(Current goals can be found in the care plan section) Acute Rehab OT Goals Patient Stated Goal: none stated OT Goal Formulation: Patient unable to participate in goal setting Time For Goal Achievement: 04/12/13 Potential to  Achieve Goals: Good ADL Goals Pt Will Perform Eating: with min assist;with adaptive utensils;sitting (and elbows supported on table for decreased tremor) Pt Will Transfer to Toilet: with max assist;bedside commode;stand pivot transfer Additional ADL Goal #1: pt will sit unsupported eob x 3 minutes with min guard in preparation for transfers  Visit Information  Last OT Received On: 03/29/13 Assistance Needed: +2 History of Present Illness: pt was admitted for CAP.  At baseline, he has private duty person who helps with all adls.  He performs SPT/squat to [facility].         Prior [place] expects to be discharged to:: Private residence Living Arrangements: Spouse/significant other Home Equipment: Bedside commode Prior Function Level of Independence: Independent with assistive device(s);Needs assistance Communication Communication: HOH Dominant Hand: Right         Vision/Perception     Cognition  Cognition Arousal/Alertness: Awake/alert Behavior During Therapy: WFL for tasks assessed/performed Overall Cognitive Status: History of cognitive impairments - at baseline    Extremity/Trunk Assessment Upper Extremity Assessment Upper Extremity Assessment: Defer to OT evaluation Lower Extremity Assessment Lower Extremity Assessment: Generalized weakness (stiff/rigid (R worse than L))     Mobility Bed Mobility Overal bed mobility: +2 for physical assistance;+ 2 for safety/equipment;Needs Assistance Bed Mobility: Supine to Sit Supine to sit: Total assist General bed mobility comments: Assist for trunk and bil LEs. Increased time. Utilized bedpad for scooting, positioning Transfers Overall transfer level: Needs assistance Transfers: Sit to/from [employer] Transfers Sit to Stand: +2 safety/equipment;+2 physical assistance;total A;From elevated surface Stand pivot transfers: +2 physical assistance;+2 safety/equipment; total A General transfer  comment: Assist to rise, stabilize, control descent. Multimodal cues for safety, technique, hand placement  Exercise     Balance Balance Overall balance assessment: Needs assistance Sitting-balance support: Bilateral upper extremity supported;Feet supported Sitting balance-Leahy Scale: Poor Standing balance-Leahy Scale: Zero   End of Session OT - End of Session Activity Tolerance: Patient tolerated treatment well Patient left: in chair;with call bell/phone within reach;with chair alarm set Nurse Communication: Mobility status  GO     Aleyna Cueva 03/29/2013, 3:06 PM Lesle Chris, OTR/L [phone] 03/29/2013

## 2013-03-29 NOTE — Progress Notes (Signed)
[facility] PROGRESS NOTE  Thomas [name]:[id number] DOB: 08/25/1916 DOA: 03/28/2013 PCP: [name], MD  Assessment/Plan  Acute hypoxic respiratory failure, likely secondary to acute COPD exacerbation.  Also consider heart failure, although no evidence of congestion on CXR.   -  CXR clear and no evidence of ventilatory defect on VQ -  D-dimer > age threshold of 940, but VQ neg for PE -  Continue steroids, duonebs and abx -  Give additional dose of lasix IV today  Hypertension and aortic stenosis, CAD with ischemic cardiomyopathy EF 45% and grade 1 DD - telemetry:  NSR, okay to d/c -  Continue ASA, plavix, statin, cardizem -  Pro BNP 3684  Chronic kidney disease, stable.  minimize nephrotoxic medications.  Generalized weakness -  PT/OT  Acute urinary retention, may be due to atrovent -  Continue increased flomax -  D/c foley catheter tomorrow morning and do voiding trial  Diet:  Dysphagia 3 with thin Access:  PIV IVF:  off Proph:  heparin  Code Status: full Family Communication: patient alone Disposition Plan: pending improvement in dyspnea, possibly home tomorrow   Consultants:  none  Procedures:  VQ  CXR  Antibiotics:  Levofloxacin 1/15 >>   HPI/Subjective:  Continues to feel SOB and have cough.  Denies cp    Objective: Filed Vitals:   03/29/13 1134 03/29/13 1426 03/29/13 1508 03/29/13 1952  BP:  118/50    Pulse: 53 60 55   Temp:  97.4 F (36.3 C)    TempSrc:  Oral    Resp: $Remo'18 18 18   'sJzSa$ SpO2: 96% 97% 94% 95%    Intake/Output Summary (Last 24 hours) at 03/29/13 2042 Last data filed at 03/29/13 1946  Gross per 24 hour  Intake    720 ml  Output   1975 ml  Net  -1255 ml   There were no vitals filed for this visit.  Exam:   General:  CM, No acute distress  HEENT:  NCAT, MMM  Cardiovascular:  RRR, nl S1, S2 2/6 systolic murmur, no rubs or gallops, 2+ pulses, warm extremities  Respiratory:  Bilateral rales at bases and  diminished, rhonchorous cough, no wheeze, no increased WOB  Abdomen:   NABS, soft, NT/ND  MSK:   Normal tone and bulk, no LEE  Neuro:  Grossly intact, slow to answer questions  Data Reviewed: Basic Metabolic Panel:  Recent Labs Lab 03/28/13 0157  NA 131*  K 3.6*  CL 91*  CO2 27  GLUCOSE 126*  BUN 22  CREATININE 1.38*  CALCIUM 8.6   Liver Function Tests: No results found for this basename: AST, ALT, ALKPHOS, BILITOT, PROT, ALBUMIN,  in the last 168 hours No results found for this basename: LIPASE, AMYLASE,  in the last 168 hours No results found for this basename: AMMONIA,  in the last 168 hours CBC:  Recent Labs Lab 03/28/13 0157  WBC 8.9  HGB 12.9*  HCT 36.6*  MCV 95.1  PLT 193   Cardiac Enzymes: No results found for this basename: CKTOTAL, CKMB, CKMBINDEX, TROPONINI,  in the last 168 hours BNP (last 3 results)  Recent Labs  06/21/12 0913 03/29/13 0556  PROBNP 8040.0* 3684.0*   CBG: No results found for this basename: GLUCAP,  in the last 168 hours  Recent Results (from the past 240 hour(s))  RESPIRATORY VIRUS PANEL     Status: None   Collection Time    03/28/13  4:08 AM      Result Value  Range Status   Source - RVPAN NASOPHARYNGEAL   Final   Respiratory Syncytial Virus A NOT DETECTED   Final   Respiratory Syncytial Virus B NOT DETECTED   Final   Influenza A NOT DETECTED   Final   Influenza B NOT DETECTED   Final   Parainfluenza 1 NOT DETECTED   Final   Parainfluenza 2 NOT DETECTED   Final   Parainfluenza 3 NOT DETECTED   Final   Metapneumovirus NOT DETECTED   Final   Rhinovirus NOT DETECTED   Final   Adenovirus NOT DETECTED   Final   Influenza A H1 NOT DETECTED   Final   Influenza A H3 NOT DETECTED   Final   Comment: (NOTE)           Normal Reference Range for each Analyte: NOT DETECTED     Testing performed using the Luminex xTAG Respiratory Viral Panel test     kit.     This test was developed and its performance characteristics determined      by [facility]. It has not been cleared or approved by the [place]     Food and Drug Administration. This test is used for clinical purposes.     It should not be regarded as investigational or for research. This     laboratory is certified under the Clinical Laboratory Improvement     Amendments of 1988 (CLIA) as qualified to perform high complexity     clinical laboratory testing.     Performed at [facility]  CULTURE, BLOOD (ROUTINE X 2)     Status: None   Collection Time    03/28/13  8:00 AM      Result Value Range Status   Specimen Description BLOOD LEFT ARM   Final   Special Requests BOTTLES DRAWN AEROBIC AND ANAEROBIC 10CC   Final   Culture  Setup Time     Final   Value: 03/28/2013 10:48     Performed at [facility]   Culture     Final   Value:        BLOOD CULTURE RECEIVED NO GROWTH TO DATE CULTURE WILL BE HELD FOR 5 DAYS BEFORE ISSUING A FINAL NEGATIVE REPORT     Performed at [facility]   Report Status PENDING   Incomplete  CULTURE, BLOOD (ROUTINE X 2)     Status: None   Collection Time    03/28/13  8:07 AM      Result Value Range Status   Specimen Description BLOOD L HAND   Final   Special Requests BOTTLES DRAWN AEROBIC AND ANAEROBIC 5CC   Final   Culture  Setup Time     Final   Value: 03/28/2013 10:48     Performed at [facility]   Culture     Final   Value:        BLOOD CULTURE RECEIVED NO GROWTH TO DATE CULTURE WILL BE HELD FOR 5 DAYS BEFORE ISSUING A FINAL NEGATIVE REPORT     Performed at [facility]   Report Status PENDING   Incomplete  CULTURE, EXPECTORATED SPUTUM-ASSESSMENT     Status: None   Collection Time    03/28/13 11:27 AM      Result Value Range Status   Specimen Description SPUTUM   Final   Special Requests NONE   Final   Sputum evaluation     Final   Value: THIS SPECIMEN IS ACCEPTABLE. RESPIRATORY CULTURE  REPORT TO FOLLOW.   Report Status 03/28/2013 FINAL   Final  CULTURE, RESPIRATORY  (NON-EXPECTORATED)     Status: None   Collection Time    03/28/13 11:27 AM      Result Value Range Status   Specimen Description SPUTUM   Final   Special Requests NONE   Final   Gram Stain     Final   Value: FEW WBC PRESENT, PREDOMINANTLY PMN     FEW SQUAMOUS EPITHELIAL CELLS PRESENT     RARE GRAM POSITIVE COCCI     IN PAIRS IN CHAINS     Performed at [facility]   Culture     Final   Value: NORMAL OROPHARYNGEAL FLORA     Performed at [facility]   Report Status PENDING   Incomplete     Studies: Dg Chest 2 View (if Patient Has Fever And/or Copd)  03/28/2013   CLINICAL DATA:  Cough.  EXAM: CHEST  2 VIEW  COMPARISON:  06/21/2012  FINDINGS: Cardiomegaly which is chronic. There is aortic tortuosity. Lungs are better aerated than previously. There is increased markings at the bases which is likely from hypoaeration. No effusion or pneumothorax. No acute osseous findings.  IMPRESSION: Mildly low lung volumes.  No edema or consolidation.   Electronically Signed   By: Jorje Guild M.D.   On: 03/28/2013 02:19   Nm Pulmonary Perf And Vent  03/28/2013   CLINICAL DATA:  Hypoxia.  Elevated D-dimer.  EXAM: NUCLEAR MEDICINE VENTILATION - PERFUSION LUNG SCAN  TECHNIQUE: Ventilation images were obtained in multiple projections using inhaled aerosol technetium 99 M DTPA. Perfusion images were obtained in multiple projections after intravenous injection of Tc-52m MAA.  COMPARISON:  PA and lateral chest 03/28/2013.  RADIOPHARMACEUTICALS:  40 mCi Tc-8m DTPA aerosol and 5 mCi Tc-21m MAA  FINDINGS: Ventilation: No focal ventilation defect.  Perfusion: No wedge shaped peripheral perfusion defects to suggest acute pulmonary embolism.  IMPRESSION: Negative for pulmonary embolus.   Electronically Signed   By: Inge Rise M.D.   On: 03/28/2013 17:05    Scheduled Meds: . aspirin  81 mg Oral Daily  . atorvastatin  40 mg Oral Daily  . budesonide-formoterol  2 puff Inhalation BID  .  clopidogrel  75 mg Oral Q breakfast  . diltiazem  180 mg Oral Daily  . docusate sodium  100 mg Oral BID  . escitalopram  20 mg Oral Daily  . feeding supplement (ENSURE COMPLETE)  120 mL Oral BID BM  . fluticasone  2 spray Each Nare Daily  . furosemide  40 mg Oral BID  . gabapentin  100 mg Oral TID  . heparin  5,000 Units Subcutaneous Q8H  . ipratropium-albuterol  3 mL Nebulization QID  . latanoprost  1 drop Both Eyes QHS  . [START ON 03/30/2013] levofloxacin (LEVAQUIN) IV  750 mg Intravenous Q48H  . polyethylene glycol  17 g Oral Daily  . predniSONE  50 mg Oral Q breakfast  . senna  2 tablet Oral QHS  . tamsulosin  0.8 mg Oral QPC supper   Continuous Infusions:   Principal Problem:   CAP (community acquired pneumonia) Active Problems:   HYPERLIPIDEMIA   HYPERTENSION   Aortic stenosis, moderate   Acute bronchitis   COPD (chronic obstructive pulmonary disease)   CKD (chronic kidney disease) stage 3, GFR 30-59 ml/min   Cardiomyopathy, ischemic   Acute respiratory failure with hypoxia   COPD with acute exacerbation    Time spent: 30  min    Nataleah Scioneaux, [facility] Pager [phone]. If 7PM-7AM, please contact night-coverage at [email or web address], password [facility] 03/29/2013, 8:42 PM  LOS: 1 day

## 2013-03-29 NOTE — Evaluation (Signed)
Physical Therapy Evaluation Patient Details Name: Thomas [name] MRN: [id number] DOB: Sep 15, 1916 Today's Date: 03/29/2013 Time: 0867-6195 PT Time Calculation (min): 26 min  PT Assessment / Plan / Recommendation History of Present Illness  80 yo male was admitted for CAP.  At baseline, he has private duty person who helps with all adls.  He performs SPT/squat to [facility].    Clinical Impression  On eval, pt required Total assist of 2 for transfer to recliner. Per son, pt requires Total assist of 1-2 for transfers (2 for car transfers) at baseline. Non ambulatory. Per son, plan is for pt to return home with wife, caregiver [name]?). Will follow on trial basis (2x/week).     PT Assessment  Patient needs continued PT services    Follow Up Recommendations  Home health PT;Supervision/Assistance - 24 hour (son states plan is for pt to return home with wife,caregiver. HHPT if family agrees.)    Does the patient have the potential to tolerate intense rehabilitation      Barriers to Discharge        Equipment Recommendations  None recommended by PT    Recommendations for Other Services     Frequency Min 2X/week    Precautions / Restrictions Precautions Precautions: Fall Restrictions Weight Bearing Restrictions: No   Pertinent Vitals/Pain No c/o pain      Mobility  Bed Mobility Overal bed mobility: +2 for physical assistance;+ 2 for safety/equipment;Total assist;Needs Assistance Bed Mobility: Supine to Sit Supine to sit: Total assist General bed mobility comments: Assist for trunk and bil LEs. Increased time. Utilized bedpad for scooting, positioning Transfers Overall transfer level: Needs assistance Transfers: Sit to/from [employer] Transfers Sit to Stand: +2 safety/equipment;+2 physical assistance;Total assist;From elevated surface Stand pivot transfers: +2 physical assistance;+2 safety/equipment; Total assist General transfer comment: Assist to rise, stabilize, control  descent. Multimodal cues for safety, technique, hand placement    Exercises     PT Diagnosis: Generalized weakness  PT Problem List: Decreased strength;Decreased range of motion;Decreased balance;Decreased mobility;Decreased activity tolerance PT Treatment Interventions: Functional mobility training;Therapeutic activities;Therapeutic exercise;Patient/family education;Balance training     PT Goals(Current goals can be found in the care plan section) Acute Rehab PT Goals Patient Stated Goal: none stated PT Goal Formulation: With family Time For Goal Achievement: 04/12/13 Potential to Achieve Goals: Fair  Visit Information  Last PT Received On: 03/29/13 Assistance Needed: +2 History of Present Illness: pt was admitted for CAP.  At baseline, he has private duty person who helps with all adls.  He performs SPT/squat to [facility].         Prior [place] expects to be discharged to:: Private residence Living Arrangements: Spouse/significant other Home Equipment: Bedside commode Prior Function Level of Independence: Independent with assistive device(s);Needs assistance Communication Communication: HOH Dominant Hand: Right    Cognition  Cognition Arousal/Alertness: Awake/alert Behavior During Therapy: WFL for tasks assessed/performed Overall Cognitive Status: History of cognitive impairments - at baseline    Extremity/Trunk Assessment Upper Extremity Assessment Upper Extremity Assessment: Defer to OT evaluation Lower Extremity Assessment Lower Extremity Assessment: Generalized weakness (stiff/rigid (R worse than L))   Balance Balance Overall balance assessment: Needs assistance Sitting-balance support: Bilateral upper extremity supported;Feet supported Sitting balance-Leahy Scale: Poor Standing balance-Leahy Scale: Zero  End of Session PT - End of Session Equipment Utilized During Treatment: Gait belt Activity Tolerance: Patient tolerated treatment  well Patient left: in chair;with call bell/phone within reach;with chair alarm set Nurse Communication: Mobility status  GP  Weston Anna, MPT Pager: [phone]

## 2013-03-30 DIAGNOSIS — J9600 Acute respiratory failure, unspecified whether with hypoxia or hypercapnia: Secondary | ICD-10-CM

## 2013-03-30 LAB — CULTURE, RESPIRATORY W GRAM STAIN: Culture: NORMAL

## 2013-03-30 LAB — CULTURE, RESPIRATORY

## 2013-03-30 LAB — BASIC METABOLIC PANEL
BUN: 37 mg/dL — AB (ref 6–23)
CHLORIDE: 96 meq/L (ref 96–112)
CO2: 28 mEq/L (ref 19–32)
Calcium: 8.6 mg/dL (ref 8.4–10.5)
Creatinine, Ser: 1.56 mg/dL — ABNORMAL HIGH (ref 0.50–1.35)
GFR calc Af Amer: 42 mL/min — ABNORMAL LOW (ref >90)
GFR calc non Af Amer: 36 mL/min — ABNORMAL LOW (ref >90)
GLUCOSE: 135 mg/dL — AB (ref 70–99)
POTASSIUM: 3.5 meq/L — AB (ref 3.7–5.3)
Sodium: 138 mEq/L (ref 137–147)

## 2013-03-30 LAB — CBC
HEMATOCRIT: 32.4 % — AB (ref 39.0–52.0)
HEMOGLOBIN: 11 g/dL — AB (ref 13.0–17.0)
MCH: 32.3 pg (ref 26.0–34.0)
MCHC: 34 g/dL (ref 30.0–36.0)
MCV: 95 fL (ref 78.0–100.0)
Platelets: 221 10*3/uL (ref 150–400)
RBC: 3.41 MIL/uL — AB (ref 4.22–5.81)
RDW: 12.3 % (ref 11.5–15.5)
WBC: 11.8 10*3/uL — ABNORMAL HIGH (ref 4.0–10.5)

## 2013-03-30 MED ORDER — IPRATROPIUM-ALBUTEROL 0.5-2.5 (3) MG/3ML IN SOLN
3.0000 mL | Freq: Three times a day (TID) | RESPIRATORY_TRACT | Status: DC
  Administered 2013-03-30 – 2013-04-01 (×9): 3 mL via RESPIRATORY_TRACT
  Filled 2013-03-30 (×9): qty 3

## 2013-03-30 NOTE — Discharge Summary (Addendum)
Physician Discharge Summary  Thomas [name] GQQ:[id number] DOB: 02/11/1917 DOA: 03/28/2013  PCP: Thomas Salisbury, MD  Admit date: 03/28/2013 Discharge date: 04/01/2013  Recommendations for Outpatient Follow-up:  1. Follow up with primary care doctor within 2 weeks of discharge for repeat BMP and CBC to follow up potassium, kidney function, anemia, and WBC.  F/u pending blood cultures.  Please start having conversations about goals of care and consider hospice care referral when patient qualifies and family ready.   2. Home with caretaker 3. Cardiologist within 1 week of discharge for ECHO and further titration of medications if necessary.   Discharge Diagnoses:  Principal Problem:   COPD with acute exacerbation Active Problems:   HYPERLIPIDEMIA   HYPERTENSION   Aortic stenosis, moderate   CKD (chronic kidney disease) stage 3, GFR 30-59 ml/min   Cardiomyopathy, ischemic   Acute respiratory failure with hypoxia   Acute on chronic combined systolic and diastolic heart failure   Discharge Condition: stable, improved  Diet recommendation: dysphagia 3 with thin  Wt Readings from Last 3 Encounters:  04/01/13 81 kg (178 lb 9.2 oz)  01/08/13 77.565 kg (171 lb)  06/26/12 63.6 kg (140 lb 3.4 oz)    History of present illness:  Thomas [name] is a 80 y.o. male with Past medical history of aortic stenosis, coronary artery disease, dyslipidemia, hypertension, chronic kidney disease.  The patient is coming from home.  The patient was brought in by his wife and caregiver mentions that since last 2 days he has been having increasingly worsening shortness of breath on exertion and this was associated with increasing cough. The cough was yellow-green expectoration. The patient did not have any fever or chills. Patient denied any chest pain or palpitation. He also denied any complaint of leg swelling, nausea, vomiting, abdominal pain, aspiration. No diarrhea.  He was on Thomas [name] which was stopped  earlier this month since his heart rate was in the 50s but other than that there is no change in his medication.  Patient denies any dizziness lightheadedness vertigo fall syncope focal neurological deficit.   Hospital Course:  Acute hypoxic respiratory failure, likely secondary to acute COPD exacerbation and acute on chronic systolic and diastolic heart failure, although no evidence of congestion on CXR.  Improved somewhat during first few days of admission, but now plateaued.  CXR clear and no evidence of ventilatory defect on VQ.  D-dimer was elevated but VQ was neg for PE.  He was given IV lasix, however, he developed AKI so his diuretics were held and then resumed.  He was also started on steroids and duonebs with a 3-day course of antibiotics.  Although he initially required oxygen, he ultimately did not qualify for home oxygen.  I feel that this hospitalization marks a progression in his chronic diseases.  Per his caretaker, Mr. Thomas [name] has had DOE which has been getting progressively worse for the last year.  He should follow up with his cardiologist, Dr. Clifton [name], within 1 week of discharge, but I think he would also benefit from palliative care in the near future.    Hypertension and aortic stenosis, CAD with ischemic cardiomyopathy EF 45% and grade 1 DD.  Judicious diuresis given aortic stenosis.  Diuresed until mild AKI and avoided hypotension.  - telemetry: NSR - Continue ASA, plavix, statin, cardizem  - Pro BNP 3684, down considerable from earlier this year - diuresed gently during hospitalization  Chronic kidney disease, stable.  Generalized weakness, PT/OT recommendedSNF, however, family would like him  to return with his caretaker   Acute urinary retention, may be due to atrovent.  He had foley catheter placed and flomax increased.  After two days, foley was discontinued and he was able to void freely.     Consultants:  none Procedures:  VQ  CXR Antibiotics:  Levofloxacin  1/15 >> 1/17   Discharge Exam: Filed Vitals:   04/01/13 0612  BP: 145/70  Pulse: 83  Temp: 97.6 F (36.4 C)  Resp: 18   Filed Vitals:   03/31/13 2226 04/01/13 0431 04/01/13 0612 04/01/13 1005  BP: 123/69  145/70   Pulse: 100  83   Temp: 97.9 F (36.6 C)  97.6 F (36.4 C)   TempSrc: Oral  Oral   Resp: 18  18   Height:      Weight:  81 kg (178 lb 9.2 oz)    SpO2: 90%  96% 98%    General: CM, No acute distress  HEENT: NCAT, MMM  Cardiovascular: RRR, nl S1, S2 2/6 systolic murmur, no rubs or gallops, 2+ pulses, warm extremities  Respiratory: Diminished as bases, fewer rales, rhonchorous cough, + wheeze, no increased WOB  Abdomen: NABS, soft, NT/ND  MSK: Normal tone and bulk, no LEE  Neuro: Grossly intact, slow to answer questions   Discharge Instructions      Discharge Orders   Future Orders Complete By Expires   (HEART FAILURE PATIENTS) Call MD:  Anytime you have any of the following symptoms: 1) 3 pound weight gain in 24 hours or 5 pounds in 1 week 2) shortness of breath, with or without a dry hacking cough 3) swelling in the hands, feet or stomach 4) if you have to sleep on extra pillows at night in order to breathe.  As directed    Call MD for:  difficulty breathing, headache or visual disturbances  As directed    Call MD for:  extreme fatigue  As directed    Call MD for:  hives  As directed    Call MD for:  persistant dizziness or light-headedness  As directed    Call MD for:  persistant nausea and vomiting  As directed    Call MD for:  severe uncontrolled pain  As directed    Call MD for:  temperature >100.4  As directed    Diet general  As directed    Comments:     Dysphagia 3 with thin liquids   Discharge instructions  As directed    Comments:     Thomas [name] was hospitalized with shortness of breath due to combination of heart failure exacerbation and possible copd.  He did not have pneumonia.  He did not have a pulmonary embolism, or blood clot in the lungs.   He should be weighed daily at home and continue his lasix.  If he gains more than 3-lbs in one day or 5-lbs in one week, call his cardiologist.  He should continue a Thomas [name] taper of steroids also and I have added a new nebulizer solution for his breathing called atrovent which can be used four times a day.  He should see his primary care doctor within 1-2 weeks of discharge.  He should also see his cardiologist Dr. Angelena Form within 1 week of discharge.  Finally, he may benefit from having a palliative care nurse meet with him and the family.   Increase activity slowly  As directed        Medication List  albuterol (2.5 MG/3ML) 0.083% nebulizer solution  Commonly known as:  PROVENTIL  Take 3 mLs (2.5 mg total) by nebulization every 4 (four) hours as needed for wheezing or shortness of breath.     aspirin 81 MG chewable tablet  Chew 1 tablet (81 mg total) by mouth daily.     atorvastatin 40 MG tablet  Commonly known as:  LIPITOR  Take 1 tablet (40 mg total) by mouth daily.     budesonide-formoterol 160-4.5 MCG/ACT inhaler  Commonly known as:  SYMBICORT  Inhale 2 puffs into the lungs 2 (two) times daily.     CARDIZEM CD 180 MG 24 hr capsule  Generic drug:  diltiazem  TAKE 1 CAPSULE DAILY     clopidogrel 75 MG tablet  Commonly known as:  PLAVIX  Take 1 tablet (75 mg total) by mouth daily with breakfast.     escitalopram 10 MG tablet  Commonly known as:  LEXAPRO  Take 20 mg by mouth daily.     feeding supplement (ENSURE COMPLETE) Liqd  Take 120 mLs by mouth 2 (two) times daily between meals.     fluticasone 50 MCG/ACT nasal spray  Commonly known as:  FLONASE  USE 2 SPRAYS NASALLY DAILY     furosemide 40 MG tablet  Commonly known as:  LASIX  Take 40 mg by mouth 2 (two) times daily.     gabapentin 100 MG capsule  Commonly known as:  NEURONTIN  Take 1 capsule (100 mg total) by mouth 3 (three) times daily.     ipratropium 0.02 % nebulizer solution  Commonly known as:   ATROVENT  Take 2.5 mLs (0.5 mg total) by nebulization 4 (four) times daily.     latanoprost 0.005 % ophthalmic solution  Commonly known as:  XALATAN  Place 1 drop into both eyes at bedtime.     multivitamin with minerals Tabs tablet  Take 1 tablet by mouth daily.     NITROSTAT 0.4 MG SL tablet  Generic drug:  nitroGLYCERIN  place 1 tablet by mouth under the tongue every 5 minutes if needed     polyethylene glycol packet  Commonly known as:  MIRALAX / GLYCOLAX  Take 17 g by mouth daily as needed (constipation).     predniSONE 10 MG tablet  Commonly known as:  DELTASONE  Take 5 tabs daily x 2 days, 4 tabs daily x 2 days, 3 tabs daily x 2 days, 2 tabs daily x 2 days, then 1 tab daily x 2 days, then stop.     sennosides-docusate sodium 8.6-50 MG tablet  Commonly known as:  SENOKOT-S  Take 1 tablet by mouth 2 (two) times daily. While taking pain meds to prevent constipation.     tamsulosin 0.4 MG Caps capsule  Commonly known as:  FLOMAX  Take 2 capsules (0.8 mg total) by mouth daily after supper.     temazepam 30 MG capsule  Commonly known as:  RESTORIL  Take 1 capsule (30 mg total) by mouth at bedtime as needed for sleep.     traMADol 50 MG tablet  Commonly known as:  ULTRAM  Take 1 tablet (50 mg total) by mouth every 6 (six) hours as needed for pain.     trolamine salicylate 10 % cream  Commonly known as:  ASPERCREME  Apply 1 application topically 4 (four) times daily -  before meals and at bedtime.       Follow-up Information   Follow up with [name], MD. Schedule  an appointment as soon as possible for a visit in 2 weeks.   Specialty:  Family Medicine   Contact information:   [address] [place] [address] [phone]       The results of significant diagnostics from this hospitalization (including imaging, microbiology, ancillary and laboratory) are listed below for reference.    Significant Diagnostic Studies: Dg Chest 2 View (if Patient  Has Fever And/or Copd)  03/28/2013   CLINICAL DATA:  Cough.  EXAM: CHEST  2 VIEW  COMPARISON:  06/21/2012  FINDINGS: Cardiomegaly which is chronic. There is aortic tortuosity. Lungs are better aerated than previously. There is increased markings at the bases which is likely from hypoaeration. No effusion or pneumothorax. No acute osseous findings.  IMPRESSION: Mildly low lung volumes.  No edema or consolidation.   Electronically Signed   By: Tiburcio Pea M.D.   On: 03/28/2013 02:19   Nm Pulmonary Perf And Vent  03/28/2013   CLINICAL DATA:  Hypoxia.  Elevated D-dimer.  EXAM: NUCLEAR MEDICINE VENTILATION - PERFUSION LUNG SCAN  TECHNIQUE: Ventilation images were obtained in multiple projections using inhaled aerosol technetium 99 M DTPA. Perfusion images were obtained in multiple projections after intravenous injection of Tc-29m MAA.  COMPARISON:  PA and lateral chest 03/28/2013.  RADIOPHARMACEUTICALS:  40 mCi Tc-91m DTPA aerosol and 5 mCi Tc-22m MAA  FINDINGS: Ventilation: No focal ventilation defect.  Perfusion: No wedge shaped peripheral perfusion defects to suggest acute pulmonary embolism.  IMPRESSION: Negative for pulmonary embolus.   Electronically Signed   By: Drusilla Kanner M.D.   On: 03/28/2013 17:05    Microbiology: Recent Results (from the past 240 hour(s))  RESPIRATORY VIRUS PANEL     Status: None   Collection Time    03/28/13  4:08 AM      Result Value Range Status   Source - RVPAN NASOPHARYNGEAL   Final   Respiratory Syncytial Virus A NOT DETECTED   Final   Respiratory Syncytial Virus B NOT DETECTED   Final   Influenza A NOT DETECTED   Final   Influenza B NOT DETECTED   Final   Parainfluenza 1 NOT DETECTED   Final   Parainfluenza 2 NOT DETECTED   Final   Parainfluenza 3 NOT DETECTED   Final   Metapneumovirus NOT DETECTED   Final   Rhinovirus NOT DETECTED   Final   Adenovirus NOT DETECTED   Final   Influenza A H1 NOT DETECTED   Final   Influenza A H3 NOT DETECTED   Final    Comment: (NOTE)           Normal Reference Range for each Analyte: NOT DETECTED     Testing performed using the Luminex xTAG Respiratory Viral Panel test     kit.     This test was developed and its performance characteristics determined     by [facility]. It has not been cleared or approved by the [place]     Food and Drug Administration. This test is used for clinical purposes.     It should not be regarded as investigational or for research. This     laboratory is certified under the Clinical Laboratory Improvement     Amendments of 1988 (CLIA) as qualified to perform high complexity     clinical laboratory testing.     Performed at [facility]  CULTURE, BLOOD (ROUTINE X 2)     Status: None   Collection Time    03/28/13  8:00 AM      Result Value Range Status   Specimen Description BLOOD LEFT ARM   Final   Special Requests BOTTLES DRAWN AEROBIC AND ANAEROBIC 10CC   Final   Culture  Setup Time     Final   Value: 03/28/2013 10:48     Performed at [facility]   Culture     Final   Value:        BLOOD CULTURE RECEIVED NO GROWTH TO DATE CULTURE WILL BE HELD FOR 5 DAYS BEFORE ISSUING A FINAL NEGATIVE REPORT     Performed at [facility]   Report Status PENDING   Incomplete  CULTURE, BLOOD (ROUTINE X 2)     Status: None   Collection Time    03/28/13  8:07 AM      Result Value Range Status   Specimen Description BLOOD L HAND   Final   Special Requests BOTTLES DRAWN AEROBIC AND ANAEROBIC 5CC   Final   Culture  Setup Time     Final   Value: 03/28/2013 10:48     Performed at [facility]   Culture     Final   Value:        BLOOD CULTURE RECEIVED NO GROWTH TO DATE CULTURE WILL BE HELD FOR 5 DAYS BEFORE ISSUING A FINAL NEGATIVE REPORT     Performed at [facility]   Report Status PENDING   Incomplete  CULTURE, EXPECTORATED SPUTUM-ASSESSMENT     Status: None   Collection Time    03/28/13 11:27 AM      Result Value Range Status   Specimen  Description SPUTUM   Final   Special Requests NONE   Final   Sputum evaluation     Final   Value: THIS SPECIMEN IS ACCEPTABLE. RESPIRATORY CULTURE REPORT TO FOLLOW.   Report Status 03/28/2013 FINAL   Final  CULTURE, RESPIRATORY (NON-EXPECTORATED)     Status: None   Collection Time    03/28/13 11:27 AM      Result Value Range Status   Specimen Description SPUTUM   Final   Special Requests NONE   Final   Gram Stain     Final   Value: FEW WBC PRESENT, PREDOMINANTLY PMN     FEW SQUAMOUS EPITHELIAL CELLS PRESENT     RARE GRAM POSITIVE COCCI     IN PAIRS IN CHAINS     Performed at [facility]   Culture     Final   Value: NORMAL OROPHARYNGEAL FLORA     Performed at [facility]   Report Status 03/30/2013 FINAL   Final     Labs: Basic Metabolic Panel:  Recent Labs Lab 03/28/13 0157 03/30/13 0552  NA 131* 138  K 3.6* 3.5*  CL 91* 96  CO2 27 28  GLUCOSE 126* 135*  BUN 22 37*  CREATININE 1.38* 1.56*  CALCIUM 8.6 8.6   Liver Function Tests: No results found for this basename: AST, ALT, ALKPHOS, BILITOT, PROT, ALBUMIN,  in the last 168 hours No results found for this basename: LIPASE, AMYLASE,  in the last 168 hours No results found for this basename: AMMONIA,  in the last 168 hours CBC:  Recent Labs Lab 03/28/13 0157 03/30/13 0552  WBC 8.9 11.8*  HGB 12.9* 11.0*  HCT 36.6* 32.4*  MCV 95.1 95.0  PLT 193 221   Cardiac Enzymes: No results found for this basename: CKTOTAL, CKMB, CKMBINDEX, TROPONINI,  in the last 168 hours  BNP: BNP (last 3 results)  Recent Labs  06/21/12 0913 03/29/13 0556  PROBNP 8040.0* 3684.0*   CBG: No results found for this basename: GLUCAP,  in the last 168 hours  Time coordinating discharge: 45 minutes  Signed:  Dannell Gortney  [facility] 04/01/2013, 3:01 PM

## 2013-03-30 NOTE — Progress Notes (Addendum)
[facility] PROGRESS NOTE  Thomas [name] OLM:[id number] DOB: [birth year] DOA: 03/28/2013 PCP: [name], MD  Assessment/Plan  Acute hypoxic respiratory failure, likely secondary to acute COPD exacerbation.  Also consider heart failure, although no evidence of congestion on CXR.  Gradually improving.   -  CXR clear and no evidence of ventilatory defect on VQ -  D-dimer > age threshold of 940, but VQ neg for PE -  Continue steroids, duonebs -  Day 3/3 of abx, d/c  -  Wean to room air  Hypertension and aortic stenosis, CAD with ischemic cardiomyopathy EF 45% and grade 1 DD - telemetry:  NSR, okay to d/c -  Continue ASA, plavix, statin, cardizem -  Pro BNP 3684 -  Appears dry today after lasix yesterday.  Hold further diuresis  Chronic kidney disease, stable.  minimize nephrotoxic medications.  Generalized weakness -  PT/OT recommending SNF, however, family would like him to return with his caretaker  Acute urinary retention, may be due to atrovent -  Continue increased flomax -  D/c foley catheter and voiding trial 4 hours later  Diet:  Dysphagia 3 with thin Access:  PIV IVF:  off Proph:  heparin  Code Status: full Family Communication: patient alone Disposition Plan: possibly home this afternoon if doing well, voiding okay, and okay on room air, otherwise, tomorrow   Consultants:  none  Procedures:  VQ  CXR  Antibiotics:  Levofloxacin 1/15 >> 1/17  HPI/Subjective:  Feels well, mildly confused.  Asking for coffee.    Objective: Filed Vitals:   03/29/13 2105 03/30/13 0523 03/30/13 0742 03/30/13 1030  BP: 106/64 127/67  145/79  Pulse: 63 64    Temp: 98.9 F (37.2 C) 98.8 F (37.1 C)    TempSrc: Oral Oral    Resp: 20 20    SpO2: 97% 97% 96%     Intake/Output Summary (Last 24 hours) at 03/30/13 1333 Last data filed at 03/30/13 0930  Gross per 24 hour  Intake    870 ml  Output   1950 ml  Net  -1080 ml   There were no vitals filed for this  visit.  Exam:   General:  CM, No acute distress  HEENT:  NCAT, MMM  Cardiovascular:  RRR, nl S1, S2 2/6 systolic murmur, no rubs or gallops, 2+ pulses, warm extremities  Respiratory: Diminished as bases, fewer rales, rhonchorous cough, no wheeze, no increased WOB  Abdomen:   NABS, soft, NT/ND  MSK:   Normal tone and bulk, no LEE  Neuro:  Grossly intact, slow to answer questions  Data Reviewed: Basic Metabolic Panel:  Recent Labs Lab 03/28/13 0157 03/30/13 0552  NA 131* 138  K 3.6* 3.5*  CL 91* 96  CO2 27 28  GLUCOSE 126* 135*  BUN 22 37*  CREATININE 1.38* 1.56*  CALCIUM 8.6 8.6   Liver Function Tests: No results found for this basename: AST, ALT, ALKPHOS, BILITOT, PROT, ALBUMIN,  in the last 168 hours No results found for this basename: LIPASE, AMYLASE,  in the last 168 hours No results found for this basename: AMMONIA,  in the last 168 hours CBC:  Recent Labs Lab 03/28/13 0157 03/30/13 0552  WBC 8.9 11.8*  HGB 12.9* 11.0*  HCT 36.6* 32.4*  MCV 95.1 95.0  PLT 193 221   Cardiac Enzymes: No results found for this basename: CKTOTAL, CKMB, CKMBINDEX, TROPONINI,  in the last 168 hours BNP (last 3 results)  Recent Labs  06/21/12 0913 03/29/13 0100  PROBNP 8040.0* 3684.0*   CBG: No results found for this basename: GLUCAP,  in the last 168 hours  Recent Results (from the past 240 hour(s))  RESPIRATORY VIRUS PANEL     Status: None   Collection Time    03/28/13  4:08 AM      Result Value Range Status   Source - RVPAN NASOPHARYNGEAL   Final   Respiratory Syncytial Virus A NOT DETECTED   Final   Respiratory Syncytial Virus B NOT DETECTED   Final   Influenza A NOT DETECTED   Final   Influenza B NOT DETECTED   Final   Parainfluenza 1 NOT DETECTED   Final   Parainfluenza 2 NOT DETECTED   Final   Parainfluenza 3 NOT DETECTED   Final   Metapneumovirus NOT DETECTED   Final   Rhinovirus NOT DETECTED   Final   Adenovirus NOT DETECTED   Final   Influenza A  H1 NOT DETECTED   Final   Influenza A H3 NOT DETECTED   Final   Comment: (NOTE)           Normal Reference Range for each Analyte: NOT DETECTED     Testing performed using the Luminex xTAG Respiratory Viral Panel test     kit.     This test was developed and its performance characteristics determined     by [facility]. It has not been cleared or approved by the [place]     Food and Drug Administration. This test is used for clinical purposes.     It should not be regarded as investigational or for research. This     laboratory is certified under the [place] (CLIA) as qualified to perform high complexity     clinical laboratory testing.     Performed at [facility], BLOOD (ROUTINE X 2)     Status: None   Collection Time    03/28/13  8:00 AM      Result Value Range Status   Specimen Description BLOOD LEFT ARM   Final   Special Requests BOTTLES DRAWN AEROBIC AND ANAEROBIC 10CC   Final   Culture  Setup Time     Final   Value: 03/28/2013 10:48     Performed at [facility]   Culture     Final   Value:        BLOOD CULTURE RECEIVED NO GROWTH TO DATE CULTURE WILL BE HELD FOR 5 DAYS BEFORE ISSUING A FINAL NEGATIVE REPORT     Performed at [facility]   Report Status PENDING   Incomplete  CULTURE, BLOOD (ROUTINE X 2)     Status: None   Collection Time    03/28/13  8:07 AM      Result Value Range Status   Specimen Description BLOOD L HAND   Final   Special Requests BOTTLES DRAWN AEROBIC AND ANAEROBIC 5CC   Final   Culture  Setup Time     Final   Value: 03/28/2013 10:48     Performed at [facility]   Culture     Final   Value:        BLOOD CULTURE RECEIVED NO GROWTH TO DATE CULTURE WILL BE HELD FOR 5 DAYS BEFORE ISSUING A FINAL NEGATIVE REPORT     Performed at [facility]   Report Status PENDING   Incomplete  CULTURE, EXPECTORATED SPUTUM-ASSESSMENT  Status: None   Collection Time     03/28/13 11:27 AM      Result Value Range Status   Specimen Description SPUTUM   Final   Special Requests NONE   Final   Sputum evaluation     Final   Value: THIS SPECIMEN IS ACCEPTABLE. RESPIRATORY CULTURE REPORT TO FOLLOW.   Report Status 03/28/2013 FINAL   Final  CULTURE, RESPIRATORY (NON-EXPECTORATED)     Status: None   Collection Time    03/28/13 11:27 AM      Result Value Range Status   Specimen Description SPUTUM   Final   Special Requests NONE   Final   Gram Stain     Final   Value: FEW WBC PRESENT, PREDOMINANTLY PMN     FEW SQUAMOUS EPITHELIAL CELLS PRESENT     RARE GRAM POSITIVE COCCI     IN PAIRS IN CHAINS     Performed at [facility]   Culture     Final   Value: NORMAL OROPHARYNGEAL FLORA     Performed at [facility]   Report Status 03/30/2013 FINAL   Final     Studies: Nm Pulmonary Perf And Vent  03/28/2013   CLINICAL DATA:  Hypoxia.  Elevated D-dimer.  EXAM: NUCLEAR MEDICINE VENTILATION - PERFUSION LUNG SCAN  TECHNIQUE: Ventilation images were obtained in multiple projections using inhaled aerosol technetium 99 M DTPA. Perfusion images were obtained in multiple projections after intravenous injection of Tc-45mMAA.  COMPARISON:  PA and lateral chest 03/28/2013.  RADIOPHARMACEUTICALS:  40 mCi Tc-969mTPA aerosol and 5 mCi Tc-994mA  FINDINGS: Ventilation: No focal ventilation defect.  Perfusion: No wedge shaped peripheral perfusion defects to suggest acute pulmonary embolism.  IMPRESSION: Negative for pulmonary embolus.   Electronically Signed   By: ThoInge RiseD.   On: 03/28/2013 17:05    Scheduled Meds: . aspirin  81 mg Oral Daily  . atorvastatin  40 mg Oral Daily  . budesonide-formoterol  2 puff Inhalation BID  . clopidogrel  75 mg Oral Q breakfast  . diltiazem  180 mg Oral Daily  . docusate sodium  100 mg Oral BID  . escitalopram  20 mg Oral Daily  . feeding supplement (ENSURE COMPLETE)  120 mL Oral BID BM  . fluticasone  2 spray  Each Nare Daily  . furosemide  40 mg Oral BID  . gabapentin  100 mg Oral TID  . heparin  5,000 Units Subcutaneous Q8H  . ipratropium-albuterol  3 mL Nebulization TID  . latanoprost  1 drop Both Eyes QHS  . levofloxacin (LEVAQUIN) IV  750 mg Intravenous Q48H  . polyethylene glycol  17 g Oral Daily  . predniSONE  50 mg Oral Q breakfast  . senna  2 tablet Oral QHS  . tamsulosin  0.8 mg Oral QPC supper   Continuous Infusions:   Principal Problem:   CAP (community acquired pneumonia) Active Problems:   HYPERLIPIDEMIA   HYPERTENSION   Aortic stenosis, moderate   Acute bronchitis   COPD (chronic obstructive pulmonary disease)   CKD (chronic kidney disease) stage 3, GFR 30-59 ml/min   Cardiomyopathy, ischemic   Acute respiratory failure with hypoxia   COPD with acute exacerbation    Time spent: 30 min    Guiseppe Flanagan, MA[facility]adissons[facility] Pager [phone] 7PM-7AM, please contact night-coverage at [email or web address], password [email or web address], 1:33 PM  LOS: 2 days

## 2013-03-31 NOTE — Progress Notes (Signed)
[facility] PROGRESS NOTE  Thomas [name] ION:[id number] DOB: Sep 27, 1916 DOA: 03/28/2013 PCP: [name], MD  Assessment/Plan  Acute hypoxic respiratory failure, likely secondary to acute COPD exacerbation.  Also consider heart failure, although no evidence of congestion on CXR.  Gradually improving.   -  CXR clear and no evidence of ventilatory defect on VQ -  D-dimer > age threshold of 940, but VQ neg for PE -  Continue steroids, duonebs -  Completed 3 days of antibiotics -  Test oxygen levels with exertion to see if he needs home oxygen   Hypertension and aortic stenosis, CAD with ischemic cardiomyopathy EF 45% and grade 1 DD - telemetry:  NSR, okay to d/c -  Continue ASA, plavix, statin, cardizem -  Pro BNP 3684  Chronic kidney disease, stable.  minimize nephrotoxic medications.  Generalized weakness -  PT/OT recommending SNF, however, family would like him to return with his caretaker  Acute urinary retention, may be due to atrovent.  Voiding well with increased flomax  Diet:  Dysphagia 3 with thin Access:  PIV IVF:  off Proph:  heparin  Code Status: full Family Communication: patient alone Disposition Plan:  No caretaker available today, anticipate discharge home with caretaker tomorrow morning.     Consultants:  none  Procedures:  VQ  CXR  Antibiotics:  Levofloxacin 1/15 >> 1/17  HPI/Subjective:  Feels well, mild SOB and cough.  Had BM yesterday.    Objective: Filed Vitals:   03/31/13 1020 03/31/13 1100 03/31/13 1429 03/31/13 1447  BP: 129/64   119/72  Pulse:    83  Temp:    98.1 F (36.7 C)  TempSrc:    Oral  Resp:    20  Height:      Weight:      SpO2:  91% 90% 97%    Intake/Output Summary (Last 24 hours) at 03/31/13 1625 Last data filed at 03/31/13 1100  Gross per 24 hour  Intake    360 ml  Output    475 ml  Net   -115 ml   Filed Weights   03/30/13 0400 03/31/13 0540  Weight: 83.8 kg (184 lb 11.9 oz) 83.8 kg (184 lb 11.9  oz)    Exam:   General:  CM, No acute distress, stable from prior  HEENT:  NCAT, MMM  Cardiovascular:  RRR, nl S1, S2 2/6 systolic murmur, no rubs or gallops, 2+ pulses, warm extremities  Respiratory: Diminished as bases, fewer rales, rhonchorous cough, no wheeze, no increased WOB  Abdomen:   NABS, soft, NT/ND  MSK:   Normal tone and bulk, no LEE  Neuro:  Grossly intact  Data Reviewed: Basic Metabolic Panel:  Recent Labs Lab 03/28/13 0157 03/30/13 0552  NA 131* 138  K 3.6* 3.5*  CL 91* 96  CO2 27 28  GLUCOSE 126* 135*  BUN 22 37*  CREATININE 1.38* 1.56*  CALCIUM 8.6 8.6   Liver Function Tests: No results found for this basename: AST, ALT, ALKPHOS, BILITOT, PROT, ALBUMIN,  in the last 168 hours No results found for this basename: LIPASE, AMYLASE,  in the last 168 hours No results found for this basename: AMMONIA,  in the last 168 hours CBC:  Recent Labs Lab 03/28/13 0157 03/30/13 0552  WBC 8.9 11.8*  HGB 12.9* 11.0*  HCT 36.6* 32.4*  MCV 95.1 95.0  PLT 193 221   Cardiac Enzymes: No results found for this basename: CKTOTAL, CKMB, CKMBINDEX, TROPONINI,  in the last 168 hours BNP (  last 3 results)  Recent Labs  06/21/12 0913 03/29/13 0556  PROBNP 8040.0* 3684.0*   CBG: No results found for this basename: GLUCAP,  in the last 168 hours  Recent Results (from the past 240 hour(s))  RESPIRATORY VIRUS PANEL     Status: None   Collection Time    03/28/13  4:08 AM      Result Value Range Status   Source - RVPAN NASOPHARYNGEAL   Final   Respiratory Syncytial Virus A NOT DETECTED   Final   Respiratory Syncytial Virus B NOT DETECTED   Final   Influenza A NOT DETECTED   Final   Influenza B NOT DETECTED   Final   Parainfluenza 1 NOT DETECTED   Final   Parainfluenza 2 NOT DETECTED   Final   Parainfluenza 3 NOT DETECTED   Final   Metapneumovirus NOT DETECTED   Final   Rhinovirus NOT DETECTED   Final   Adenovirus NOT DETECTED   Final   Influenza A H1 NOT  DETECTED   Final   Influenza A H3 NOT DETECTED   Final   Comment: (NOTE)           Normal Reference Range for each Analyte: NOT DETECTED     Testing performed using the Luminex xTAG Respiratory Viral Panel test     kit.     This test was developed and its performance characteristics determined     by [facility]. It has not been cleared or approved by the [place]     Food and Drug Administration. This test is used for clinical purposes.     It should not be regarded as investigational or for research. This     laboratory is certified under the Clinical Laboratory Improvement     Amendments of 1988 (CLIA) as qualified to perform high complexity     clinical laboratory testing.     Performed at [facility]  CULTURE, BLOOD (ROUTINE X 2)     Status: None   Collection Time    03/28/13  8:00 AM      Result Value Range Status   Specimen Description BLOOD LEFT ARM   Final   Special Requests BOTTLES DRAWN AEROBIC AND ANAEROBIC 10CC   Final   Culture  Setup Time     Final   Value: 03/28/2013 10:48     Performed at [facility]   Culture     Final   Value:        BLOOD CULTURE RECEIVED NO GROWTH TO DATE CULTURE WILL BE HELD FOR 5 DAYS BEFORE ISSUING A FINAL NEGATIVE REPORT     Performed at [facility]   Report Status PENDING   Incomplete  CULTURE, BLOOD (ROUTINE X 2)     Status: None   Collection Time    03/28/13  8:07 AM      Result Value Range Status   Specimen Description BLOOD L HAND   Final   Special Requests BOTTLES DRAWN AEROBIC AND ANAEROBIC 5CC   Final   Culture  Setup Time     Final   Value: 03/28/2013 10:48     Performed at [facility]   Culture     Final   Value:        BLOOD CULTURE RECEIVED NO GROWTH TO DATE CULTURE WILL BE HELD FOR 5 DAYS BEFORE ISSUING A FINAL NEGATIVE REPORT     Performed at [facility]   Report Status  PENDING   Incomplete  CULTURE, EXPECTORATED SPUTUM-ASSESSMENT     Status: None   Collection Time     03/28/13 11:27 AM      Result Value Range Status   Specimen Description SPUTUM   Final   Special Requests NONE   Final   Sputum evaluation     Final   Value: THIS SPECIMEN IS ACCEPTABLE. RESPIRATORY CULTURE REPORT TO FOLLOW.   Report Status 03/28/2013 FINAL   Final  CULTURE, RESPIRATORY (NON-EXPECTORATED)     Status: None   Collection Time    03/28/13 11:27 AM      Result Value Range Status   Specimen Description SPUTUM   Final   Special Requests NONE   Final   Gram Stain     Final   Value: FEW WBC PRESENT, PREDOMINANTLY PMN     FEW SQUAMOUS EPITHELIAL CELLS PRESENT     RARE GRAM POSITIVE COCCI     IN PAIRS IN CHAINS     Performed at [facility]   Culture     Final   Value: NORMAL OROPHARYNGEAL FLORA     Performed at [facility]   Report Status 03/30/2013 FINAL   Final     Studies: No results found.  Scheduled Meds: . aspirin  81 mg Oral Daily  . atorvastatin  40 mg Oral Daily  . budesonide-formoterol  2 puff Inhalation BID  . clopidogrel  75 mg Oral Q breakfast  . diltiazem  180 mg Oral Daily  . docusate sodium  100 mg Oral BID  . escitalopram  20 mg Oral Daily  . feeding supplement (ENSURE COMPLETE)  120 mL Oral BID BM  . fluticasone  2 spray Each Nare Daily  . furosemide  40 mg Oral BID  . gabapentin  100 mg Oral TID  . heparin  5,000 Units Subcutaneous Q8H  . ipratropium-albuterol  3 mL Nebulization TID  . latanoprost  1 drop Both Eyes QHS  . polyethylene glycol  17 g Oral Daily  . predniSONE  50 mg Oral Q breakfast  . senna  2 tablet Oral QHS  . tamsulosin  0.8 mg Oral QPC supper   Continuous Infusions:   Principal Problem:   CAP (community acquired pneumonia) Active Problems:   HYPERLIPIDEMIA   HYPERTENSION   Aortic stenosis, moderate   Acute bronchitis   COPD (chronic obstructive pulmonary disease)   CKD (chronic kidney disease) stage 3, GFR 30-59 ml/min   Cardiomyopathy, ischemic   Acute respiratory failure with hypoxia   COPD  with acute exacerbation    Time spent: 30 min    Natasja Niday, [facility] Pager [phone]. If 7PM-7AM, please contact night-coverage at [email or web address], password [facility] 03/31/2013, 4:25 PM  LOS: 3 days

## 2013-04-01 DIAGNOSIS — I5043 Acute on chronic combined systolic (congestive) and diastolic (congestive) heart failure: Principal | ICD-10-CM

## 2013-04-01 MED ORDER — IPRATROPIUM BROMIDE 0.02 % IN SOLN: 0.5000 mg | Freq: Four times a day (QID) | RESPIRATORY_TRACT | Status: DC

## 2013-04-01 MED ORDER — PREDNISONE 10 MG PO TABS: ORAL_TABLET | ORAL | Status: DC

## 2013-04-01 MED ORDER — TAMSULOSIN HCL 0.4 MG PO CAPS: 0.8000 mg | ORAL_CAPSULE | Freq: Every day | ORAL | Status: DC

## 2013-04-01 NOTE — Progress Notes (Signed)
Patient at rest on RA Sats = 88%, will continue to monitor patient thought out the rest of shift

## 2013-04-01 NOTE — Progress Notes (Signed)
Physical Therapy Treatment Patient Details Name: Thomas [name] MRN: [id number] DOB: 08/28/1916 Today's Date: 04/01/2013 Time: 2992-4268 PT Time Calculation (min): 17 min  PT Assessment / Plan / Recommendation  History of Present Illness pt was admitted for CAP.  At baseline, he has private duty person who helps with all adls.  He performs SPT/squat to [facility].     PT Comments   *Did not perform bed to chair transfer 2* pt was found on a bedpan (likely for a long duration), had pain and redness on buttocks and low back. Pt positioned on side for pressure relief. RN aware of above. **  Follow Up Recommendations  Home health PT;Supervision/Assistance - 24 hour (son states plan is for pt to return home with wife,caregiver)     Does the patient have the potential to tolerate intense rehabilitation     Barriers to Discharge        Equipment Recommendations  None recommended by PT    Recommendations for Other Services    Frequency Min 2X/week   Progress towards PT Goals Progress towards PT goals: Not progressing toward goals - comment  Plan Current plan remains appropriate    Precautions / Restrictions Precautions Precautions: Fall Restrictions Weight Bearing Restrictions: No   Pertinent Vitals/Pain *pt reported pain in low back and buttocks, not rated  RN aware**    Mobility  Bed Mobility Bed Mobility: Rolling Rolling: Total assist (pt rolled to left x2 and to right x 1 for pericare) General bed mobility comments: Pt was found to be on a bedpan, it appeared he'd been left on it for a long time as his skin was red and BM in bedpan was drying. Pt was not aware he was on a bedpan but did c/o pain in buttocks area. RN Simona Huh notified. Barrier cream applied. Pt positioned on side to relieve pressure. RN stated he'd fill out Safety Portal.     Exercises General Exercises - Lower Extremity Heel Slides: AAROM;Both;10 reps;Supine Hip ABduction/ADduction: AAROM;Both;10 reps;Supine   PT  Diagnosis:    PT Problem List:   PT Treatment Interventions:     PT Goals (current goals can now be found in the care plan section) Acute Rehab PT Goals Patient Stated Goal: none stated Time For Goal Achievement: 04/12/13 Potential to Achieve Goals: Fair  Visit Information  Last PT Received On: 04/01/13 History of Present Illness: pt was admitted for CAP.  At baseline, he has private duty person who helps with all adls.  He performs SPT/squat to [facility].      Subjective Data  Patient Stated Goal: none stated   Cognition  Cognition Arousal/Alertness: Awake/alert Behavior During Therapy: WFL for tasks assessed/performed Overall Cognitive Status: History of cognitive impairments - at baseline    Balance     End of Session PT - End of Session Activity Tolerance: Patient tolerated treatment well Patient left: with call bell/phone within reach;in bed Nurse Communication: Mobility status   GP     Blondell Reveal Kistler 04/01/2013, 2:24 PM [phone]

## 2013-04-01 NOTE — Progress Notes (Signed)
Notified Amy, Case Manager of need for oxygen at home/discharge per doctor order

## 2013-04-01 NOTE — Progress Notes (Addendum)
Patient lying in bed, alert and oriented, visual and physical assessment done on patient at this time due to busy assignment, patient was not turned at this time, after leaving room PT/OT came to do Evaluation on patient, during their assessment and turning patient it was discovered that this patient was on bedpan and had been there for several hours, reported this to the Primary Nurse, who went in to assess patient,s buttock for skin break down, there was no skin break down but patient had developed an stage 1, broken circle on buttock with non-blanchable areas, patient was cleaned, cream applied, and turned on left sided, Dr notified, wife notified, and night staff informed of patient's incident, there was no staff members to validate when this patient was placed on bedpan, patient in stable condition at this time and will continue to monitor, and turn patient Q2 hours for the reminder of patient's stay.

## 2013-04-01 NOTE — Progress Notes (Signed)
Called pt's wife [name] this morning to discuss d/c planning. She informed me that pt would be returning home at d/c and would like to receive [facility] services through [facility]. [facility] has been made aware of this. MD needs to order the servcies.  Allene Dillon RN BSN   [phone]

## 2013-04-01 NOTE — Progress Notes (Signed)
Thomas [name], Case Manager, phoned stating that Thomas [name] driver coming from [facility] with oxygen for discharge home.  Notified patient's wife that we were awaiting the arrival of the oxygen and then he would be ready for discharge.  She has requested that we call her when he is ready and [name] the caretaker will come for him.

## 2013-04-01 NOTE — Progress Notes (Signed)
04/01/2013 A. Yoshiharu Brassell RNCM 1936pm EDCM received phone call from [place] reporting that patient is to discharged this evening but needs home oxygen.  EDCM spoke to patient's caregiver Knox Royalty at bedside.  [facility] chosen for home oxygen.  [facility] faxed orders for home oxygen to [facility] at 1836pm with confirmation of receipt at 1842pm.  [facility] spoke to [facility] at [facility] who reports oxygen will be delivered to patient's hospital room [address] on [address] and the concentrator will be delivered to patient's home this evening.  EDCM also received call from Ria Comment of [facility] reportingthat fax has been received and that 2 portable oxygen tanks will be delivered to patient's room [address] this evening. [place] notified [facility] that oxygen will be delivered to the patient's room this evening and provided [place] phone number for [place].  EDCM also placed phone number to [facility] on faxed referral.  No further EDCM needs at this time.

## 2013-04-01 NOTE — Progress Notes (Addendum)
Patient alert and oriented x 3, discharge orders written, family and caregiver notified that patient was ready for discharge, caregiver arrived and requested that patient be sent home with O2, Dr notified of familles request for O2, order given for home O2 DME/PRN, Social Worker notified of request and assess patient's order and plan per Dr orders, will send patient home when O2 arrive at bedside, patient in stable condition at this time

## 2013-04-01 NOTE — Progress Notes (Addendum)
Patient lying in bed, alert and oriented x 3, O2 Sats on 11/2L/[place] = 94%,  Exertion rolling from side to side O2 Sats drop to 87% on RA , at rest Sats = 89% and 93%, Dr notified, order given for home O2/PRN, patient in stable condition at this time, patient is not ambulatory but chair fast

## 2013-04-02 ENCOUNTER — Telehealth: Payer: Self-pay | Admitting: Family Medicine

## 2013-04-02 ENCOUNTER — Telehealth: Payer: Self-pay | Admitting: Cardiovascular Disease

## 2013-04-02 MED ORDER — IPRATROPIUM BROMIDE 0.02 % IN SOLN: 0.5000 mg | Freq: Four times a day (QID) | RESPIRATORY_TRACT | Status: DC

## 2013-04-02 MED ORDER — TAMSULOSIN HCL 0.4 MG PO CAPS: 0.8000 mg | ORAL_CAPSULE | Freq: Every day | ORAL | Status: DC

## 2013-04-02 MED ORDER — PREDNISONE 10 MG PO TABS: ORAL_TABLET | ORAL | Status: DC

## 2013-04-02 NOTE — Progress Notes (Signed)
04/02/2013 A. Melaya Hoselton RNCM 1549pm [facility] called patient for follow up and spoke to patient's wife [name]. As per [name], patient has received his oxygen and has the concentrator at home as well.  Patient's wife thankful for services.  No further EDCM needs at this time.

## 2013-04-02 NOTE — Telephone Encounter (Signed)
New problem   Pt need to speak to nurse concerning his low heart rate and visit to ER on this week. Please call pt

## 2013-04-02 NOTE — Telephone Encounter (Signed)
Pt came home from hospital  and refused start care for today. Mickel Baas will  start the pt care tomorrow.

## 2013-04-02 NOTE — Telephone Encounter (Signed)
Pt discharged from hospital yesterday--CHF/COPD exacerbation. Pt's wife reports this morning pt's heart rate was  47 and 50, his BP 127/64. Pt  does not report any lightheadedness or dizziness , although he is in bed now and is usually  in bed most of the time.  Pt does take Cardizem CD180 mg daily. I reviewed with Dr Acie Fredrickson (DOD) and he did not recommend any changes at present. I will forward to Dr Angelena Form for review.

## 2013-04-03 LAB — CULTURE, BLOOD (ROUTINE X 2)
CULTURE: NO GROWTH
Culture: NO GROWTH

## 2013-04-03 NOTE — Telephone Encounter (Signed)
Please give this order  

## 2013-04-03 NOTE — Telephone Encounter (Signed)
Agree, no changes. cdm

## 2013-04-03 NOTE — Telephone Encounter (Signed)
Thomas [name] needs verbal ok orders for skilled nurse 2 x wk for week one, then 3 x week for 2 wks, then one x week for 3 wks.. Pt does not want physical therapy or OT therapy

## 2013-04-04 NOTE — Telephone Encounter (Signed)
I left voice message for Thomas [name] with verbal orders.

## 2013-04-08 ENCOUNTER — Telehealth: Payer: Self-pay | Admitting: Family Medicine

## 2013-04-08 ENCOUNTER — Encounter: Payer: Self-pay | Admitting: Cardiovascular Disease

## 2013-04-08 NOTE — Telephone Encounter (Signed)
Pt is better and family would like to know if pt needs to continue neubulizer treatments any more (Atrovent) 4 x day;  is it ok to do prn? verble  ok

## 2013-04-08 NOTE — Telephone Encounter (Signed)
I spoke with Mickel Baas and gave verbal order.

## 2013-04-08 NOTE — Telephone Encounter (Signed)
Change to every 6 hours prn only

## 2013-04-09 ENCOUNTER — Encounter: Payer: Medicare Other | Admitting: Nurse Practitioner

## 2013-04-10 ENCOUNTER — Ambulatory Visit (INDEPENDENT_AMBULATORY_CARE_PROVIDER_SITE_OTHER): Payer: Medicare Other | Admitting: Family Medicine

## 2013-04-10 ENCOUNTER — Encounter: Payer: Self-pay | Admitting: Family Medicine

## 2013-04-10 VITALS — BP 114/64 | HR 75 | Temp 97.8°F | Ht 70.0 in | Wt 172.0 lb

## 2013-04-10 DIAGNOSIS — I10 Essential (primary) hypertension: Secondary | ICD-10-CM

## 2013-04-10 DIAGNOSIS — I2510 Atherosclerotic heart disease of native coronary artery without angina pectoris: Secondary | ICD-10-CM

## 2013-04-10 DIAGNOSIS — J441 Chronic obstructive pulmonary disease with (acute) exacerbation: Secondary | ICD-10-CM

## 2013-04-10 DIAGNOSIS — G589 Mononeuropathy, unspecified: Secondary | ICD-10-CM

## 2013-04-10 DIAGNOSIS — N183 Chronic kidney disease, stage 3 unspecified: Secondary | ICD-10-CM

## 2013-04-10 DIAGNOSIS — N189 Chronic kidney disease, unspecified: Secondary | ICD-10-CM

## 2013-04-10 DIAGNOSIS — N179 Acute kidney failure, unspecified: Secondary | ICD-10-CM

## 2013-04-10 NOTE — Progress Notes (Signed)
   Subjective:    Patient ID: Thomas [name], male    DOB: 06-Jul-1916, 80 y.o.   MRN: [id number]  HPI Here to follow up a hospital stay from 03-28-13 to 04-01-13 for an acute exacerbation of COPD and an acute flare of CHF. He was given a few days of antibiotics. His VQ scan was negative for PE. His EF by ECHO was 45%. He is here with his wife and home aide. He has done well since going home everyone agrees. His appetite has picked up somewhat. He does not use oxygen.    Review of Systems  Constitutional: Negative.   Respiratory: Positive for shortness of breath and wheezing. Negative for cough, choking and chest tightness.   Cardiovascular: Negative.   Neurological: Negative.        Objective:   Physical Exam  Constitutional:  Alert, in a wheelchair   Cardiovascular: Normal rate, regular rhythm and intact distal pulses.   Has his usual 3/6 SM   Pulmonary/Chest: Effort normal. No respiratory distress. He has no rales.  Scattered soft wheezes   Musculoskeletal: He exhibits no edema.  Neurological: He is alert.          Assessment & Plan:  He is doing well considering his pulmonary and cardiac issues. Get a CBC and BMET today. He is to see Cardiology on 04-18-13.

## 2013-04-11 ENCOUNTER — Telehealth: Payer: Self-pay | Admitting: Family Medicine

## 2013-04-11 LAB — CBC WITH DIFFERENTIAL/PLATELET
BASOS PCT: 0.5 % (ref 0.0–3.0)
Basophils Absolute: 0.1 10*3/uL (ref 0.0–0.1)
EOS PCT: 0.1 % (ref 0.0–5.0)
Eosinophils Absolute: 0 10*3/uL (ref 0.0–0.7)
HCT: 46.2 % (ref 39.0–52.0)
HEMOGLOBIN: 15.2 g/dL (ref 13.0–17.0)
LYMPHS ABS: 0.7 10*3/uL (ref 0.7–4.0)
Lymphocytes Relative: 4.9 % — ABNORMAL LOW (ref 12.0–46.0)
MCHC: 33 g/dL (ref 30.0–36.0)
MCV: 100.1 fl — ABNORMAL HIGH (ref 78.0–100.0)
MONOS PCT: 2.8 % — AB (ref 3.0–12.0)
Monocytes Absolute: 0.4 10*3/uL (ref 0.1–1.0)
Neutro Abs: 12.1 10*3/uL — ABNORMAL HIGH (ref 1.4–7.7)
Neutrophils Relative %: 91.7 % — ABNORMAL HIGH (ref 43.0–77.0)
Platelets: 202 10*3/uL (ref 150.0–400.0)
RBC: 4.62 Mil/uL (ref 4.22–5.81)
RDW: 13 % (ref 11.5–14.6)
WBC: 13.2 10*3/uL — AB (ref 4.5–10.5)

## 2013-04-11 LAB — BASIC METABOLIC PANEL
BUN: 35 mg/dL — ABNORMAL HIGH (ref 6–23)
CO2: 31 mEq/L (ref 19–32)
Calcium: 8.6 mg/dL (ref 8.4–10.5)
Chloride: 95 mEq/L — ABNORMAL LOW (ref 96–112)
Creatinine, Ser: 1.6 mg/dL — ABNORMAL HIGH (ref 0.4–1.5)
GFR: 42.31 mL/min — ABNORMAL LOW (ref >60.00)
GLUCOSE: 133 mg/dL — AB (ref 70–99)
Potassium: 3.8 mEq/L (ref 3.5–5.1)
Sodium: 135 mEq/L (ref 135–145)

## 2013-04-11 NOTE — Telephone Encounter (Signed)
Relevant patient education mailed to patient.  

## 2013-04-12 ENCOUNTER — Other Ambulatory Visit: Payer: Self-pay | Admitting: Family Medicine

## 2013-04-15 ENCOUNTER — Ambulatory Visit: Payer: Medicare Other | Admitting: Family Medicine

## 2013-04-15 ENCOUNTER — Telehealth: Payer: Self-pay | Admitting: Family Medicine

## 2013-04-15 NOTE — Telephone Encounter (Signed)
Calling to report that per pt's wife he had pretty bad weekend as he seemed to be more lathargic than usual.  However, he has come around today.  According to home health nurse(Laura) pt's vitals were stable, temp was 99 axillary, lungs sounded good.  They are requesting refill of sleeping med, temazepam (RESTORIL) 30 MG capsule  to be sent in to local pharmacy([facility].  Nurse states she also recommended hospice care.  However spouse states she is not ready for that quite yet as she is also dealing with the recent news of her daughter being diagnosed with breast cancer.

## 2013-04-15 NOTE — Telephone Encounter (Signed)
Relevant patient education mailed to patient.  

## 2013-04-15 NOTE — Telephone Encounter (Signed)
Per Dr. Sarajane Jews okay to send in a 30 day supply, pt is waiting on his mail order. I called in script and spoke with pt's wife.

## 2013-04-15 NOTE — Telephone Encounter (Signed)
Okay for 6 months 

## 2013-04-16 ENCOUNTER — Telehealth: Payer: Self-pay | Admitting: Cardiovascular Disease

## 2013-04-16 NOTE — Telephone Encounter (Signed)
Called Mickel Baas from [place] home [facility] to discuss this patient.  She is with a patient and will call me back today.

## 2013-04-16 NOTE — Telephone Encounter (Signed)
New Message  Pt wife called states that he is unable to walk// is there anything that we can do// should they cancel// pt is requesting a call back from the nurse//SR

## 2013-04-16 NOTE — Telephone Encounter (Signed)
Spoke with Mrs. Lenz.  She states pt was unable to stand with walker today to transfer from bed to bedside commode due to extreme weakness. His BP taken by home care staff today is 113/81, HR 77, T 98.0. "all he wants to do is sleep"  "talking in a whisper". She doesn't know his O2 sat on the oxygen, but they took it off when they tried to transfer him and his sats dropped to 80%.  Asked her if they talked about taking him back to hospital again if needed and she said she hopes not, he got a bedsore the last time that they are trying to heal.  Asked her if she has spoken to their daughter about the change in his condition--she was crying and stated no because of her daughter's recent dx of breast cancer.  Daughter is having surgery next week.  Encouraged Mrs. Ndiaye to contact her daughter to discuss pts change of condition.  She also has a son but he "lives out of town".  Canceling the appointment with Richardson Dopp on 2/5.

## 2013-04-16 NOTE — Telephone Encounter (Signed)
Spoke with the [employer], Mickel Baas.  She was in the home yesterday and plans a visit back with him tomorrow.  She states there is a CNA in the home everyday and she thinks he has started staying at night as well.  She will call the CNA today to get update on pts condition change.  Also, when she goes in the home tomorrow she will again discuss hospice care with them.

## 2013-04-17 ENCOUNTER — Encounter (HOSPITAL_COMMUNITY): Payer: Self-pay | Admitting: Emergency Medicine

## 2013-04-17 ENCOUNTER — Inpatient Hospital Stay (HOSPITAL_COMMUNITY)
Admission: EM | Admit: 2013-04-17 | Discharge: 2013-04-22 | DRG: 192 | Disposition: A | Discharge status: Hospice | Payer: Medicare Other | Attending: Internal Medicine | Admitting: Internal Medicine

## 2013-04-17 ENCOUNTER — Emergency Department (HOSPITAL_COMMUNITY): Payer: Medicare Other

## 2013-04-17 DIAGNOSIS — N183 Chronic kidney disease, stage 3 unspecified: Secondary | ICD-10-CM

## 2013-04-17 DIAGNOSIS — E538 Deficiency of other specified B group vitamins: Secondary | ICD-10-CM

## 2013-04-17 DIAGNOSIS — I359 Nonrheumatic aortic valve disorder, unspecified: Secondary | ICD-10-CM | POA: Diagnosis present

## 2013-04-17 DIAGNOSIS — J441 Chronic obstructive pulmonary disease with (acute) exacerbation: Secondary | ICD-10-CM

## 2013-04-17 DIAGNOSIS — I2119 ST elevation (STEMI) myocardial infarction involving other coronary artery of inferior wall: Secondary | ICD-10-CM

## 2013-04-17 DIAGNOSIS — I129 Hypertensive chronic kidney disease with stage 1 through stage 4 chronic kidney disease, or unspecified chronic kidney disease: Secondary | ICD-10-CM | POA: Diagnosis present

## 2013-04-17 DIAGNOSIS — I2510 Atherosclerotic heart disease of native coronary artery without angina pectoris: Secondary | ICD-10-CM

## 2013-04-17 DIAGNOSIS — R42 Dizziness and giddiness: Secondary | ICD-10-CM

## 2013-04-17 DIAGNOSIS — Z9861 Coronary angioplasty status: Secondary | ICD-10-CM

## 2013-04-17 DIAGNOSIS — I472 Ventricular tachycardia: Secondary | ICD-10-CM

## 2013-04-17 DIAGNOSIS — M1990 Unspecified osteoarthritis, unspecified site: Secondary | ICD-10-CM

## 2013-04-17 DIAGNOSIS — J9601 Acute respiratory failure with hypoxia: Secondary | ICD-10-CM

## 2013-04-17 DIAGNOSIS — R05 Cough: Secondary | ICD-10-CM

## 2013-04-17 DIAGNOSIS — J189 Pneumonia, unspecified organism: Secondary | ICD-10-CM

## 2013-04-17 DIAGNOSIS — Z7982 Long term (current) use of aspirin: Secondary | ICD-10-CM

## 2013-04-17 DIAGNOSIS — G4700 Insomnia, unspecified: Secondary | ICD-10-CM

## 2013-04-17 DIAGNOSIS — I255 Ischemic cardiomyopathy: Secondary | ICD-10-CM

## 2013-04-17 DIAGNOSIS — K219 Gastro-esophageal reflux disease without esophagitis: Secondary | ICD-10-CM

## 2013-04-17 DIAGNOSIS — Z96659 Presence of unspecified artificial knee joint: Secondary | ICD-10-CM

## 2013-04-17 DIAGNOSIS — N179 Acute kidney failure, unspecified: Secondary | ICD-10-CM

## 2013-04-17 DIAGNOSIS — R269 Unspecified abnormalities of gait and mobility: Secondary | ICD-10-CM

## 2013-04-17 DIAGNOSIS — R0609 Other forms of dyspnea: Secondary | ICD-10-CM

## 2013-04-17 DIAGNOSIS — R079 Chest pain, unspecified: Secondary | ICD-10-CM

## 2013-04-17 DIAGNOSIS — I5031 Acute diastolic (congestive) heart failure: Secondary | ICD-10-CM

## 2013-04-17 DIAGNOSIS — N320 Bladder-neck obstruction: Secondary | ICD-10-CM

## 2013-04-17 DIAGNOSIS — I10 Essential (primary) hypertension: Secondary | ICD-10-CM

## 2013-04-17 DIAGNOSIS — I5043 Acute on chronic combined systolic (congestive) and diastolic (congestive) heart failure: Secondary | ICD-10-CM

## 2013-04-17 DIAGNOSIS — R413 Other amnesia: Secondary | ICD-10-CM

## 2013-04-17 DIAGNOSIS — J209 Acute bronchitis, unspecified: Secondary | ICD-10-CM

## 2013-04-17 DIAGNOSIS — I252 Old myocardial infarction: Secondary | ICD-10-CM

## 2013-04-17 DIAGNOSIS — N400 Enlarged prostate without lower urinary tract symptoms: Secondary | ICD-10-CM | POA: Diagnosis present

## 2013-04-17 DIAGNOSIS — Z515 Encounter for palliative care: Secondary | ICD-10-CM

## 2013-04-17 DIAGNOSIS — S065XAA Traumatic subdural hemorrhage with loss of consciousness status unknown, initial encounter: Secondary | ICD-10-CM

## 2013-04-17 DIAGNOSIS — R001 Bradycardia, unspecified: Secondary | ICD-10-CM

## 2013-04-17 DIAGNOSIS — F32A Depression, unspecified: Secondary | ICD-10-CM

## 2013-04-17 DIAGNOSIS — N401 Enlarged prostate with lower urinary tract symptoms: Secondary | ICD-10-CM

## 2013-04-17 DIAGNOSIS — C679 Malignant neoplasm of bladder, unspecified: Secondary | ICD-10-CM

## 2013-04-17 DIAGNOSIS — Z8551 Personal history of malignant neoplasm of bladder: Secondary | ICD-10-CM

## 2013-04-17 DIAGNOSIS — R1310 Dysphagia, unspecified: Secondary | ICD-10-CM

## 2013-04-17 DIAGNOSIS — I4729 Other ventricular tachycardia: Secondary | ICD-10-CM

## 2013-04-17 DIAGNOSIS — F329 Major depressive disorder, single episode, unspecified: Secondary | ICD-10-CM

## 2013-04-17 DIAGNOSIS — R339 Retention of urine, unspecified: Secondary | ICD-10-CM | POA: Diagnosis present

## 2013-04-17 DIAGNOSIS — I951 Orthostatic hypotension: Secondary | ICD-10-CM

## 2013-04-17 DIAGNOSIS — J449 Chronic obstructive pulmonary disease, unspecified: Principal | ICD-10-CM

## 2013-04-17 DIAGNOSIS — H6120 Impacted cerumen, unspecified ear: Secondary | ICD-10-CM

## 2013-04-17 DIAGNOSIS — S065X9A Traumatic subdural hemorrhage with loss of consciousness of unspecified duration, initial encounter: Secondary | ICD-10-CM

## 2013-04-17 DIAGNOSIS — K222 Esophageal obstruction: Secondary | ICD-10-CM

## 2013-04-17 DIAGNOSIS — R0600 Dyspnea, unspecified: Secondary | ICD-10-CM | POA: Diagnosis present

## 2013-04-17 DIAGNOSIS — N390 Urinary tract infection, site not specified: Secondary | ICD-10-CM

## 2013-04-17 DIAGNOSIS — R0989 Other specified symptoms and signs involving the circulatory and respiratory systems: Secondary | ICD-10-CM

## 2013-04-17 DIAGNOSIS — E785 Hyperlipidemia, unspecified: Secondary | ICD-10-CM

## 2013-04-17 DIAGNOSIS — Z87891 Personal history of nicotine dependence: Secondary | ICD-10-CM

## 2013-04-17 DIAGNOSIS — J309 Allergic rhinitis, unspecified: Secondary | ICD-10-CM

## 2013-04-17 DIAGNOSIS — N189 Chronic kidney disease, unspecified: Secondary | ICD-10-CM | POA: Diagnosis present

## 2013-04-17 DIAGNOSIS — I350 Nonrheumatic aortic (valve) stenosis: Secondary | ICD-10-CM

## 2013-04-17 DIAGNOSIS — E860 Dehydration: Secondary | ICD-10-CM

## 2013-04-17 DIAGNOSIS — K5900 Constipation, unspecified: Secondary | ICD-10-CM

## 2013-04-17 DIAGNOSIS — R0602 Shortness of breath: Secondary | ICD-10-CM

## 2013-04-17 DIAGNOSIS — Z66 Do not resuscitate: Secondary | ICD-10-CM | POA: Diagnosis present

## 2013-04-17 DIAGNOSIS — M25569 Pain in unspecified knee: Secondary | ICD-10-CM

## 2013-04-17 DIAGNOSIS — R059 Cough, unspecified: Secondary | ICD-10-CM

## 2013-04-17 DIAGNOSIS — I959 Hypotension, unspecified: Secondary | ICD-10-CM

## 2013-04-17 DIAGNOSIS — I6529 Occlusion and stenosis of unspecified carotid artery: Secondary | ICD-10-CM

## 2013-04-17 DIAGNOSIS — Z9981 Dependence on supplemental oxygen: Secondary | ICD-10-CM

## 2013-04-17 DIAGNOSIS — R627 Adult failure to thrive: Secondary | ICD-10-CM | POA: Diagnosis present

## 2013-04-17 DIAGNOSIS — J4489 Other specified chronic obstructive pulmonary disease: Principal | ICD-10-CM | POA: Diagnosis present

## 2013-04-17 DIAGNOSIS — G589 Mononeuropathy, unspecified: Secondary | ICD-10-CM

## 2013-04-17 DIAGNOSIS — R5381 Other malaise: Secondary | ICD-10-CM

## 2013-04-17 HISTORY — DX: Dependence on supplemental oxygen: Z99.81

## 2013-04-17 HISTORY — DX: Malignant neoplasm of prostate: C61

## 2013-04-17 HISTORY — DX: Pneumonia, unspecified organism: J18.9

## 2013-04-17 HISTORY — DX: Unspecified malignant neoplasm of skin, unspecified: C44.90

## 2013-04-17 HISTORY — DX: Dyspnea, unspecified: R06.00

## 2013-04-17 HISTORY — DX: Unspecified dementia, unspecified severity, without behavioral disturbance, psychotic disturbance, mood disturbance, and anxiety: F03.90

## 2013-04-17 LAB — CBC WITH DIFFERENTIAL/PLATELET
BASOS ABS: 0 10*3/uL (ref 0.0–0.1)
Basophils Relative: 0 % (ref 0–1)
EOS ABS: 0.1 10*3/uL (ref 0.0–0.7)
EOS PCT: 2 % (ref 0–5)
HCT: 39.3 % (ref 39.0–52.0)
Hemoglobin: 13.7 g/dL (ref 13.0–17.0)
Lymphocytes Relative: 12 % (ref 12–46)
Lymphs Abs: 1 10*3/uL (ref 0.7–4.0)
MCH: 33.6 pg (ref 26.0–34.0)
MCHC: 34.9 g/dL (ref 30.0–36.0)
MCV: 96.3 fL (ref 78.0–100.0)
MONO ABS: 0.5 10*3/uL (ref 0.1–1.0)
Monocytes Relative: 5 % (ref 3–12)
Neutro Abs: 6.9 10*3/uL (ref 1.7–7.7)
Neutrophils Relative %: 81 % — ABNORMAL HIGH (ref 43–77)
Platelets: 115 10*3/uL — ABNORMAL LOW (ref 150–400)
RBC: 4.08 MIL/uL — ABNORMAL LOW (ref 4.22–5.81)
RDW: 13.2 % (ref 11.5–15.5)
WBC: 8.6 10*3/uL (ref 4.0–10.5)

## 2013-04-17 LAB — POCT I-STAT TROPONIN I: TROPONIN I, POC: 0.71 ng/mL — AB (ref 0.00–0.08)

## 2013-04-17 LAB — BASIC METABOLIC PANEL
BUN: 25 mg/dL — AB (ref 6–23)
CALCIUM: 8.6 mg/dL (ref 8.4–10.5)
CO2: 32 meq/L (ref 19–32)
CREATININE: 1.42 mg/dL — AB (ref 0.50–1.35)
Chloride: 94 mEq/L — ABNORMAL LOW (ref 96–112)
GFR calc Af Amer: 47 mL/min — ABNORMAL LOW (ref >90)
GFR calc non Af Amer: 41 mL/min — ABNORMAL LOW (ref >90)
GLUCOSE: 96 mg/dL (ref 70–99)
Potassium: 3.9 mEq/L (ref 3.7–5.3)
Sodium: 140 mEq/L (ref 137–147)

## 2013-04-17 LAB — PRO B NATRIURETIC PEPTIDE: PRO B NATRI PEPTIDE: 5602 pg/mL — AB (ref 0–450)

## 2013-04-17 MED ORDER — SODIUM CHLORIDE 0.9 % IJ SOLN
3.0000 mL | Freq: Two times a day (BID) | INTRAMUSCULAR | Status: DC
  Administered 2013-04-17 – 2013-04-22 (×10): 3 mL via INTRAVENOUS

## 2013-04-17 MED ORDER — ONDANSETRON HCL 4 MG PO TABS: 4.0000 mg | ORAL_TABLET | Freq: Four times a day (QID) | ORAL | Status: DC | PRN

## 2013-04-17 MED ORDER — MORPHINE SULFATE 2 MG/ML IJ SOLN
1.0000 mg | INTRAMUSCULAR | Status: DC | PRN
  Administered 2013-04-18 (×2): 1 mg via INTRAVENOUS
  Filled 2013-04-17 (×2): qty 1

## 2013-04-17 MED ORDER — ALBUTEROL SULFATE (2.5 MG/3ML) 0.083% IN NEBU: 2.5000 mg | INHALATION_SOLUTION | RESPIRATORY_TRACT | Status: DC | PRN

## 2013-04-17 MED ORDER — NITROGLYCERIN 0.3 MG SL SUBL
0.3000 mg | SUBLINGUAL_TABLET | SUBLINGUAL | Status: DC | PRN
  Filled 2013-04-17: qty 100

## 2013-04-17 MED ORDER — SODIUM CHLORIDE 0.9 % IJ SOLN: 3.0000 mL | INTRAMUSCULAR | Status: DC | PRN

## 2013-04-17 MED ORDER — ALBUTEROL SULFATE (2.5 MG/3ML) 0.083% IN NEBU
5.0000 mg | INHALATION_SOLUTION | Freq: Once | RESPIRATORY_TRACT | Status: AC
  Administered 2013-04-17: 5 mg via RESPIRATORY_TRACT
  Filled 2013-04-17: qty 6

## 2013-04-17 MED ORDER — IPRATROPIUM BROMIDE 0.02 % IN SOLN
0.5000 mg | Freq: Once | RESPIRATORY_TRACT | Status: AC
  Administered 2013-04-17: 0.5 mg via RESPIRATORY_TRACT
  Filled 2013-04-17: qty 2.5

## 2013-04-17 MED ORDER — ONDANSETRON HCL 4 MG/2ML IJ SOLN: 4.0000 mg | Freq: Four times a day (QID) | INTRAMUSCULAR | Status: DC | PRN

## 2013-04-17 MED ORDER — LORAZEPAM 2 MG/ML IJ SOLN: 1.0000 mg | INTRAMUSCULAR | Status: DC | PRN

## 2013-04-17 MED ORDER — SODIUM CHLORIDE 0.9 % IV SOLN: 250.0000 mL | INTRAVENOUS | Status: DC | PRN

## 2013-04-17 NOTE — ED Provider Notes (Signed)
CSN: [id number]     Arrival date & time 04/17/13  1324 History   First MD Initiated Contact with Patient 04/17/13 1325     Chief Complaint  Patient presents with  . Shortness of Breath    Patient is a 80 y.o. male presenting with shortness of breath. The history is provided by the patient, a caregiver, a relative and the EMS personnel. The history is limited by the condition of the patient.  Shortness of Breath Severity:  Moderate Onset quality:  Gradual Duration: several days ago. Timing:  Constant Progression:  Worsening Relieved by:  Nothing Worsened by:  Nothing tried pt presents from home Per caregiver pt has had increased COPD and increased oxygen requirement at home for past 2-3 days He is on home oxygen at 2L but his oxygen requirement has increased and he has been more confused No fever is reported.  No CP is reported  Past Medical History  Diagnosis Date  . Aortic valve stenosis, moderate     last echo April 4014   . Coronary artery disease     s/p PCI in 1995; Cath 2005 showed moderate 2 vessel CAD involving the distal Left main & LCx/OM, treated medically  . Myocardial infarction   . Hyperlipidemia   . Hypertension   . Vitamin B12 deficiency   . Neuropathy   . Allergic rhinitis   . OA (osteoarthritis)   . History of bladder cancer   . GERD (gastroesophageal reflux disease)   . Esophageal stricture   . Chronic kidney disease     over active bladder  . BPH (benign prostatic hypertrophy)     URINARY RETENTION-HAS INDWELLING FOLEY CATHETER  . Memory loss     short term memory loss which pt denies  . COPD (chronic obstructive pulmonary disease)    Past Surgical History  Procedure Laterality Date  . Coronary angioplasty    . Appendectomy    . Cholecystectomy    . Tonsillectomy    . Sigmoidoscopy  1999  . Colonoscopy    . Bilateral elbow surgery    . Left knee replacement      sees Dr. Gaynelle Arabian   . Carotid endarterectomy  08/2008  .  Esophagogastroduodenoscopy      dilation  . Transurethral resection of prostate N/A 04/25/2012    Procedure: TRANSURETHRAL RESECTION OF THE PROSTATE WITH GYRUS INSTRUMENTS;  Surgeon: Alexis Frock, MD;  Location: [facility];  Service: Urology;  Laterality: N/A;  . Cystoscopy with biopsy N/A 04/25/2012    Procedure: CYSTOSCOPY WITH BIOPSY;  Surgeon: Alexis Frock, MD;  Location: [facility];  Service: Urology;  Laterality: N/A;   Family History  Problem Relation Age of Onset  . Coronary artery disease Brother   . Prostate cancer      first degree relative   History  Substance Use Topics  . Smoking status: Former Smoker -- 1.00 packs/day for 10 years    Quit date: 03/14/1950  . Smokeless tobacco: Never Used  . Alcohol Use: No    Review of Systems  Unable to perform ROS: Mental status change  Respiratory: Positive for shortness of breath.     Allergies  Sulfamethoxazole  Home Medications   Current Outpatient Rx  Name  Route  Sig  Dispense  Refill  . albuterol (PROVENTIL) (2.5 MG/3ML) 0.083% nebulizer solution   Nebulization   Take 3 mLs (2.5 mg total) by nebulization every 4 (four) hours as needed for wheezing or shortness of breath.   75  mL   11   . aspirin 81 MG chewable tablet   Oral   Chew 1 tablet (81 mg total) by mouth daily.         Marland Kitchen atorvastatin (LIPITOR) 40 MG tablet   Oral   Take 1 tablet (40 mg total) by mouth daily.   90 tablet   1   . budesonide-formoterol (SYMBICORT) 160-4.5 MCG/ACT inhaler   Inhalation   Inhale 2 puffs into the lungs 2 (two) times daily.   3 Inhaler   3   . clopidogrel (PLAVIX) 75 MG tablet   Oral   Take 1 tablet (75 mg total) by mouth daily with breakfast.   90 tablet   3   . diltiazem (CARDIZEM CD) 180 MG 24 hr capsule   Oral   Take 180 mg by mouth daily.         Marland Kitchen escitalopram (LEXAPRO) 10 MG tablet   Oral   Take 20 mg by mouth daily.         . feeding supplement (ENSURE COMPLETE) LIQD   Oral   Take 120 mLs by mouth  2 (two) times daily between meals.         . fluticasone (FLONASE) 50 MCG/ACT nasal spray   Each Nare   Place 2 sprays into both nostrils daily.         . furosemide (LASIX) 40 MG tablet   Oral   Take 40 mg by mouth 2 (two) times daily.         Marland Kitchen gabapentin (NEURONTIN) 100 MG capsule   Oral   Take 1 capsule (100 mg total) by mouth 3 (three) times daily.   180 capsule   3   . ipratropium (ATROVENT) 0.02 % nebulizer solution   Nebulization   Take 2.5 mLs (0.5 mg total) by nebulization 4 (four) times daily.   300 mL   0   . latanoprost (XALATAN) 0.005 % ophthalmic solution   Both Eyes   Place 1 drop into both eyes at bedtime.   7.5 mL   3   . Multiple Vitamin (MULTIVITAMIN WITH MINERALS) TABS   Oral   Take 1 tablet by mouth daily.         Marland Kitchen NITROSTAT 0.4 MG SL tablet      place 1 tablet by mouth under the tongue every 5 minutes if needed   25 tablet   0   . polyethylene glycol (MIRALAX / GLYCOLAX) packet   Oral   Take 17 g by mouth daily as needed (constipation).   14 each      . predniSONE (DELTASONE) 10 MG tablet      Take 5 tabs daily x 2 days, 4 tabs daily x 2 days, 3 tabs daily x 2 days, 2 tabs daily x 2 days, then 1 tab daily x 2 days, then stop.   30 tablet   0   . sennosides-docusate sodium (SENOKOT-S) 8.6-50 MG tablet   Oral   Take 1 tablet by mouth 2 (two) times daily. While taking pain meds to prevent constipation.   30 tablet   0   . tamsulosin (FLOMAX) 0.4 MG CAPS capsule   Oral   Take 2 capsules (0.8 mg total) by mouth daily after supper.   60 capsule   0   . temazepam (RESTORIL) 30 MG capsule      take 1 capsule by mouth at bedtime if needed for sleep   30 capsule   5   .  traMADol (ULTRAM) 50 MG tablet   Oral   Take 1 tablet (50 mg total) by mouth every 6 (six) hours as needed for pain.   360 tablet   3   . trolamine salicylate (ASPERCREME) 10 % cream   Topical   Apply 1 application topically 4 (four) times daily -   before meals and at bedtime.          BP 107/58  Pulse 87  Temp(Src) 99.2 F (37.3 C) (Oral)  Resp 26  Ht 5\' 10"  (1.778 m)  Wt 172 lb (78.019 kg)  BMI 24.68 kg/m2  SpO2 96% Physical Exam CONSTITUTIONAL: elderly, ill appearing HEAD: Normocephalic/atraumatic EYES: EOMI ENMT: Mucous membranes dry NECK: supple no meningeal signs CV: S1/S2 noted, no murmurs/rubs/gallops noted LUNGS: mild tachypnea noted, scattered wheezes noted ABDOMEN: soft, nontender, no rebound or guarding NEURO: Pt is awake/alert, moves all extremitiesx4 EXTREMITIES: pulses normal, full ROM, minimal pitting edema to bilateral LE SKIN: warm, color normal PSYCH: no abnormalities of mood noted  ED Course  Procedures (including critical care time) Labs Review Labs Reviewed  BASIC METABOLIC PANEL - Abnormal; Notable for the following:    Chloride 94 (*)    BUN 25 (*)    Creatinine, Ser 1.42 (*)    GFR calc non Af Amer 41 (*)    GFR calc Af Amer 47 (*)    All other components within normal limits  CBC WITH DIFFERENTIAL - Abnormal; Notable for the following:    RBC 4.08 (*)    Platelets 115 (*)    Neutrophils Relative % 81 (*)    All other components within normal limits  PRO B NATRIURETIC PEPTIDE - Abnormal; Notable for the following:    Pro B Natriuretic peptide (BNP) 5602.0 (*)    All other components within normal limits  POCT I-STAT TROPONIN I - Abnormal; Notable for the following:    Troponin i, poc 0.71 (*)    All other components within normal limits   Imaging Review Dg Chest Portable 1 View  04/17/2013   CLINICAL DATA:  Shortness of Breath  EXAM: PORTABLE CHEST - 1 VIEW  COMPARISON:  March 28, 2013  FINDINGS: There is a small area of infiltrate in the lateral left base. Lungs are otherwise clear. Heart is enlarged with normal pulmonary vascularity, stable. No adenopathy. There is atherosclerotic change in the aorta. There is extensive arthropathy in both shoulders.  IMPRESSION: Small area of  infiltrate left base laterally. Lungs otherwise clear. Stable cardiomegaly.   Electronically Signed   By: Lowella Grip M.D.   On: 04/17/2013 14:05    EKG Interpretation    Date/Time:  Wednesday April 17 2013 13:42:17 EST Ventricular Rate:  75 PR Interval:  164 QRS Duration: 96 QT Interval:  427 QTC Calculation: 477 R Axis:   -21 Text Interpretation:  Ectopic atrial rhythm Inferior infarct, old Lateral leads are also involved Confirmed by Christy Gentles  MD, [place] [phone]) on 04/17/2013 1:49:08 PM            MDM   1. Dyspnea   2. Dehydration    Nursing notes including past medical history and social history reviewed and considered in documentation Labs/vital reviewed and considered xrays reviewed and considered  3:20 PM I had long discussion with family and caregiver.  It has been decided that pt will be DNR and placed on hospice due to end stage nature of his illnesses.   Currently, he has responded to treatment in the ER and is  currently stable His CXR showed ?infiltrate, but no fever, no elevated WBC noted, defer antibiotics for now D/w [facility] dr Wendee Beavers, will admit to palliative care floor for consideration of hospice    Sharyon Cable, MD 04/17/13 1521

## 2013-04-17 NOTE — ED Notes (Signed)
MD at bedside. Hospital service

## 2013-04-17 NOTE — ED Notes (Signed)
Per [facility] pt is from home, pt has end stage COPD and is trying to get him established with Hospice, pt's PA instructed family to bring him to hospital and set him up with Hospice from there. Pt's pupils are pinpoint, pt is lethargic. Pt is alert to self and place. Family reports he is possibly a DNR but was unable to locate his DNR papers. sats 88% on 4L [place], pt uses at home O2 of 4 L via [place]. BP 124/76, HR 72, RR 10

## 2013-04-17 NOTE — ED Notes (Signed)
Results of troponin given to Dr Wickline 

## 2013-04-17 NOTE — ED Notes (Signed)
Family at bedside. 

## 2013-04-17 NOTE — ED Notes (Signed)
Changed patient to [place] 4 litres -O2 sat 93% (COPD)

## 2013-04-17 NOTE — Telephone Encounter (Signed)
Thanks, chris 

## 2013-04-17 NOTE — H&P (Signed)
[facility] History and Physical  Thomas [name] [id number] DOB: 21-Jul-1916 DOA: 04/17/2013  Referring physician: Dr. Christy Gentles PCP: [name], MD   Chief Complaint: Failure to thrive with worsening shortness of breath  HPI: Thomas [name] is a 80 y.o. male  With history of aortic stenosis, coronary artery disease, dyslipidemia, hypertension, chronic kidney disease, COPD and per family recent MI. Presents to the ED at patient's request secondary to worsening shortness of breath. Father had discussions with family and his wishes currently are for comfort care measures. Much of the history is obtained from the daughters report that he is tired of being tired. Reportedly patient has had multiple hospital admissions for pneumonias and his condition has progressively gotten worse. To the point where he is having difficulty breathing and has gotten weaker.  In the ED patient was evaluated and found to have normal white blood cell count, Tachypnea, normal WBC count and elevated serum creatinine 1.4. We were consult at for further evaluation and recommendations regarding comfort care measures given patient's continued failure to thrive.   Review of Systems:  Unable to accurately assess as patient has limited responses to questions and limited cooperation.  Past Medical History  Diagnosis Date  . Aortic valve stenosis, moderate     last echo April 4014   . Coronary artery disease     s/p PCI in 1995; Cath 2005 showed moderate 2 vessel CAD involving the distal Left main & LCx/OM, treated medically  . Myocardial infarction   . Hyperlipidemia   . Hypertension   . Vitamin B12 deficiency   . Neuropathy   . Allergic rhinitis   . OA (osteoarthritis)   . History of bladder cancer   . GERD (gastroesophageal reflux disease)   . Esophageal stricture   . Chronic kidney disease     over active bladder  . BPH (benign prostatic hypertrophy)     URINARY RETENTION-HAS INDWELLING FOLEY  CATHETER  . Memory loss     short term memory loss which pt denies  . COPD (chronic obstructive pulmonary disease)    Past Surgical History  Procedure Laterality Date  . Coronary angioplasty    . Appendectomy    . Cholecystectomy    . Tonsillectomy    . Sigmoidoscopy  1999  . Colonoscopy    . Bilateral elbow surgery    . Left knee replacement      sees Dr. Gaynelle Arabian   . Carotid endarterectomy  08/2008  . Esophagogastroduodenoscopy      dilation  . Transurethral resection of prostate N/A 04/25/2012    Procedure: TRANSURETHRAL RESECTION OF THE PROSTATE WITH GYRUS INSTRUMENTS;  Surgeon: Alexis Frock, MD;  Location: [facility];  Service: Urology;  Laterality: N/A;  . Cystoscopy with biopsy N/A 04/25/2012    Procedure: CYSTOSCOPY WITH BIOPSY;  Surgeon: Alexis Frock, MD;  Location: [facility];  Service: Urology;  Laterality: N/A;   Social History:  reports that he quit smoking about 63 years ago. He has never used smokeless tobacco. He reports that he does not drink alcohol or use illicit drugs.  Allergies  Allergen Reactions  . Sulfamethoxazole     REACTION: unspecified    Family History  Problem Relation Age of Onset  . Coronary artery disease Brother   . Prostate cancer      first degree relative     Prior to Admission medications   Medication Sig Start Date End Date Taking? Authorizing Provider  albuterol (PROVENTIL) (2.5 MG/3ML) 0.083% nebulizer solution  Take 3 mLs (2.5 mg total) by nebulization every 4 (four) hours as needed for wheezing or shortness of breath. 12/17/12  Yes [name], MD  aspirin 81 MG chewable tablet Chew 1 tablet (81 mg total) by mouth daily. 06/26/12  Yes Rhonda G Barrett, PA-C  atorvastatin (LIPITOR) 40 MG tablet Take 1 tablet (40 mg total) by mouth daily. 12/04/12  Yes [name], MD  budesonide-formoterol [facility]) 160-4.5 MCG/ACT inhaler Inhale 2 puffs into the lungs 2 (two) times daily. 11/06/12  Yes [name], MD  clopidogrel (PLAVIX) 75  MG tablet Take 1 tablet (75 mg total) by mouth daily with breakfast. 09/07/12  Yes [name], MD  diltiazem (CARDIZEM CD) 180 MG 24 hr capsule Take 180 mg by mouth daily.   Yes Historical Provider, MD  escitalopram (LEXAPRO) 10 MG tablet Take 20 mg by mouth daily. 06/21/12  Yes [name], MD  feeding supplement (ENSURE COMPLETE) LIQD Take 120 mLs by mouth 2 (two) times daily between meals. 05/11/12  Yes Eugenie Filler, MD  fluticasone [facility]) 50 MCG/ACT nasal spray Place 2 sprays into both nostrils daily.   Yes Historical Provider, MD  furosemide (LASIX) 40 MG tablet Take 40 mg by mouth 2 (two) times daily.   Yes Historical Provider, MD  gabapentin (NEURONTIN) 100 MG capsule Take 1 capsule (100 mg total) by mouth 3 (three) times daily. 11/26/12  Yes [name], MD  ipratropium (ATROVENT) 0.02 % nebulizer solution Take 2.5 mLs (0.5 mg total) by nebulization 4 (four) times daily. 04/02/13  Yes Janece Canterbury, MD  latanoprost (XALATAN) 0.005 % ophthalmic solution Place 1 drop into both eyes at bedtime. 09/10/12  Yes [name], MD  Multiple Vitamin (MULTIVITAMIN WITH MINERALS) TABS Take 1 tablet by mouth daily.   Yes Historical Provider, MD  NITROSTAT 0.4 MG SL tablet place 1 tablet by mouth under the tongue every 5 minutes if needed 08/13/12  Yes [name], MD  polyethylene glycol (MIRALAX / GLYCOLAX) packet Take 17 g by mouth daily as needed (constipation). 06/26/12  Yes Rhonda G Barrett, PA-C  predniSONE (DELTASONE) 10 MG tablet Take 5 tabs daily x 2 days, 4 tabs daily x 2 days, 3 tabs daily x 2 days, 2 tabs daily x 2 days, then 1 tab daily x 2 days, then stop. 04/02/13  Yes Janece Canterbury, MD  sennosides-docusate sodium (SENOKOT-S) 8.6-50 MG tablet Take 1 tablet by mouth 2 (two) times daily. While taking pain meds to prevent constipation. 04/26/12  Yes Alexis Frock, MD  tamsulosin (FLOMAX) 0.4 MG CAPS capsule Take 2 capsules (0.8 mg total) by mouth daily after supper. 04/02/13  Yes  Janece Canterbury, MD  temazepam (RESTORIL) 30 MG capsule take 1 capsule by mouth at bedtime if needed for sleep 04/12/13  Yes [name], MD  traMADol (ULTRAM) 50 MG tablet Take 1 tablet (50 mg total) by mouth every 6 (six) hours as needed for pain. 10/16/12  Yes [name], MD  trolamine salicylate (ASPERCREME) 10 % cream Apply 1 application topically 4 (four) times daily -  before meals and at bedtime.   Yes Historical Provider, MD   Physical Exam: Filed Vitals:   04/17/13 1621  BP: 123/62  Pulse: 84  Temp: 98.8 F (37.1 C)  Resp: 24    BP 123/62  Pulse 84  Temp(Src) 98.8 F (37.1 C) (Oral)  Resp 24  Ht 5\' 10"  (1.778 m)  Wt 78.019 kg (172 lb)  BMI 24.68 kg/m2  SpO2 95%  General:  Patient is in no acute distress, arousable on command Eyes: PERRL, no icterus ENT: Normal exterior appearance, dry mucous membranes Neck: Supple, no thyromegaly Cardiovascular: Normal S1 and S2 no rubs Respiratory: CTA bilaterally, no w/r/r. Normal respiratory effort. Abdomen: soft, ntnd Skin: no rash or induration  Musculoskeletal: no cyanosis or clubbing Psychiatric: Unable to properly assess due to limited cooperation on exam Neurologic: Unable to properly assess due to limited cooperation on exam. No facial asymmetry           Labs on Admission:  Basic Metabolic Panel:  Recent Labs Lab 04/17/13 1334  NA 140  K 3.9  CL 94*  CO2 32  GLUCOSE 96  BUN 25*  CREATININE 1.42*  CALCIUM 8.6   Liver Function Tests: No results found for this basename: AST, ALT, ALKPHOS, BILITOT, PROT, ALBUMIN,  in the last 168 hours No results found for this basename: LIPASE, AMYLASE,  in the last 168 hours No results found for this basename: AMMONIA,  in the last 168 hours CBC:  Recent Labs Lab 04/17/13 1334  WBC 8.6  NEUTROABS 6.9  HGB 13.7  HCT 39.3  MCV 96.3  PLT 115*   Cardiac Enzymes: No results found for this basename: CKTOTAL, CKMB, CKMBINDEX, TROPONINI,  in the last 168 hours  BNP  (last 3 results)  Recent Labs  06/21/12 0913 03/29/13 0556 04/17/13 1334  PROBNP 8040.0* 3684.0* 5602.0*   CBG: No results found for this basename: GLUCAP,  in the last 168 hours  Radiological Exams on Admission: Dg Chest Portable 1 View  04/17/2013   CLINICAL DATA:  Shortness of Breath  EXAM: PORTABLE CHEST - 1 VIEW  COMPARISON:  March 28, 2013  FINDINGS: There is a small area of infiltrate in the lateral left base. Lungs are otherwise clear. Heart is enlarged with normal pulmonary vascularity, stable. No adenopathy. There is atherosclerotic change in the aorta. There is extensive arthropathy in both shoulders.  IMPRESSION: Small area of infiltrate left base laterally. Lungs otherwise clear. Stable cardiomegaly.   Electronically Signed   By: Lowella Grip M.D.   On: 04/17/2013 14:05    EKG: Independently reviewed. Normal sinus rhythm with no ST elevations or depression  Assessment/Plan Active Problems:   Dyspnea/Failure to thrive in adult -Case discussed with daughters at bedside and patient at this point it has been decided for comfort care measures. This means no aggressive measures meaning no frequent lab checks no IV fluids or antibiotics. With the goals to be comfort care. - I will plan on providing supplemental oxygen, morphine and Ativan for discomfort and anxiety related to shortness of breath - Will also have anti-emetics and albuterol on board   Code Status: DO NOT RESUSCITATE Family Communication: Discussed thoroughly with daughters at bedside Disposition Plan: Comfort care measures  Time spent: > 55 minutes  Velvet Bathe [facility] Pager [phone]

## 2013-04-17 NOTE — ED Notes (Signed)
RT at bedside starting breathing treatment.

## 2013-04-17 NOTE — ED Notes (Signed)
MD Wickline at bedside. 

## 2013-04-18 ENCOUNTER — Encounter: Payer: Medicare Other | Admitting: Physician Assistant

## 2013-04-18 DIAGNOSIS — Z515 Encounter for palliative care: Secondary | ICD-10-CM

## 2013-04-18 DIAGNOSIS — N183 Chronic kidney disease, stage 3 unspecified: Secondary | ICD-10-CM

## 2013-04-18 DIAGNOSIS — R627 Adult failure to thrive: Secondary | ICD-10-CM

## 2013-04-18 DIAGNOSIS — E860 Dehydration: Secondary | ICD-10-CM

## 2013-04-18 DIAGNOSIS — I2589 Other forms of chronic ischemic heart disease: Secondary | ICD-10-CM

## 2013-04-18 MED ORDER — MORPHINE SULFATE 2 MG/ML IJ SOLN
1.0000 mg | INTRAMUSCULAR | Status: DC | PRN
  Administered 2013-04-19 – 2013-04-21 (×7): 1 mg via INTRAVENOUS
  Filled 2013-04-18 (×7): qty 1

## 2013-04-18 MED ORDER — WHITE PETROLATUM GEL
Status: AC
  Filled 2013-04-18: qty 5

## 2013-04-18 MED ORDER — BISACODYL 10 MG RE SUPP: 10.0000 mg | Freq: Every day | RECTAL | Status: DC | PRN

## 2013-04-18 NOTE — Progress Notes (Signed)
UR completed 

## 2013-04-18 NOTE — Progress Notes (Signed)
RT NTS per MD order. Not much reutrn, seems to be thick. Pt stable through out. No bleeding noted.

## 2013-04-18 NOTE — Progress Notes (Signed)
[facility] PROGRESS NOTE  Thomas [name] DXI:[id number] DOB: 1917/02/15 DOA: 04/17/2013 PCP: [name], MD  Assessment/Plan:  80 y.o. male with history of aortic stenosis, coronary artery disease, dyslipidemia, hypertension, chronic kidney disease, COPD and per family recent MI. Presents to the ED at patient's request secondary to worsening shortness of breath. Father had discussions with family and his wishes currently are for comfort care measures. Much of the history is obtained from the daughters report that he is tired of being tired. Reportedly patient has had multiple hospital admissions for pneumonias and his condition has progressively gotten worse. To the point where he is having difficulty breathing and has gotten weaker.   Assessment/Plan Active Problems:  Dyspnea/Failure to thrive in adult  - Dr. Wendee Beavers discussed with daughters at bedside and patient at this point it has been decided for comfort care measures. This means no aggressive measures meaning no frequent lab checks no IV fluids or antibiotics. With the goals to be comfort care.  -plan on providing supplemental oxygen, morphine and Ativan for discomfort and anxiety related to shortness of breath  - also have anti-emetics and albuterol on board  -c/s palliative hospice care  Code Status: DNR Family Communication: d/w patient, no family at the bedside; plan to contact later (indicate person spoken with, relationship, and if by phone, the number) Disposition Plan: pend clinical improvement    Consultants:  Palliative   Procedures:  none  Antibiotics:  None  (indicate start date, and stop date if known)  HPI/Subjective: Lethargic   Objective: Filed Vitals:   04/18/13 0512  BP: 124/65  Pulse: 87  Temp: 98.2 F (36.8 C)  Resp: 19    Intake/Output Summary (Last 24 hours) at 04/18/13 0839 Last data filed at 04/18/13 0515  Gross per 24 hour  Intake      0 ml  Output    550 ml  Net   -550 ml    Filed Weights   04/17/13 1329 04/17/13 1702  Weight: 78.019 kg (172 lb) 78.2 kg (172 lb 6.4 oz)    Exam:   General:  Lethargic   Cardiovascular: s1,s2 rrr  Respiratory: few rales at bases   Abdomen: spft, nt, nd   Musculoskeletal: no edema in LL   Data Reviewed: Basic Metabolic Panel:  Recent Labs Lab 04/17/13 1334  NA 140  K 3.9  CL 94*  CO2 32  GLUCOSE 96  BUN 25*  CREATININE 1.42*  CALCIUM 8.6   Liver Function Tests: No results found for this basename: AST, ALT, ALKPHOS, BILITOT, PROT, ALBUMIN,  in the last 168 hours No results found for this basename: LIPASE, AMYLASE,  in the last 168 hours No results found for this basename: AMMONIA,  in the last 168 hours CBC:  Recent Labs Lab 04/17/13 1334  WBC 8.6  NEUTROABS 6.9  HGB 13.7  HCT 39.3  MCV 96.3  PLT 115*   Cardiac Enzymes: No results found for this basename: CKTOTAL, CKMB, CKMBINDEX, TROPONINI,  in the last 168 hours BNP (last 3 results)  Recent Labs  06/21/12 0913 03/29/13 0556 04/17/13 1334  PROBNP 8040.0* 3684.0* 5602.0*   CBG: No results found for this basename: GLUCAP,  in the last 168 hours  No results found for this or any previous visit (from the past 240 hour(s)).   Studies: Dg Chest Portable 1 View  04/17/2013   CLINICAL DATA:  Shortness of Breath  EXAM: PORTABLE CHEST - 1 VIEW  COMPARISON:  March 28, 2013  FINDINGS: There is a small area of infiltrate in the lateral left base. Lungs are otherwise clear. Heart is enlarged with normal pulmonary vascularity, stable. No adenopathy. There is atherosclerotic change in the aorta. There is extensive arthropathy in both shoulders.  IMPRESSION: Small area of infiltrate left base laterally. Lungs otherwise clear. Stable cardiomegaly.   Electronically Signed   By: Lowella Grip M.D.   On: 04/17/2013 14:05    Scheduled Meds: . sodium chloride  3 mL Intravenous Q12H   Continuous Infusions:   Active Problems:   Dyspnea   Failure  to thrive in adult    Time spent: >35 minutes     Kinnie Feil  [facility] Pager [phone]. If 7PM-7AM, please contact night-coverage at [email or web address], password [facility] 04/18/2013, 8:39 AM  LOS: 1 day

## 2013-04-18 NOTE — Consult Note (Signed)
I have reviewed this case with our NP and agree with the Assessment and Plan as stated.  Markis Langland L. Jaquetta Currier, MD MBA The Palliative Medicine Team at [place] Team Phone: [phone] Pager: [phone]   

## 2013-04-18 NOTE — Consult Note (Signed)
Patient Thomas [name]      DOB: 1916-08-30      DGU:[id number]     Consult Note from the Palliative Medicine Team at [place] Requested by: Dr. Daleen Bo     PCP: [name], MD Reason for Consultation: [place] and hospice options.  Phone Number:[phone]  Assessment of patients Current state: 80 yo male with COPD, recurrent pneumonias, CKD, aortic stenosis, and recent MI with stent placement. I met today with his family wife Thomas [name] [phone]), son and wife Thomas [name] and Thomas [name] [phone]), and daughter and husband Thomas [name] and Thomas [name] [phone]), and caregiver [name] [phone]). They ask that we contact Thomas [name] for further placement details and coordination. We had a long and frank discussion about Mr. Tokarz natural disease trajectory and rapid decline. The family says that he has outlined in his Advanced Directives that he wants comfort and they have previously discussed to focus solely on his comfort. They say he has been gradually declining the past few months and that since Friday he has not eaten but a couple bites, he does not speak but maybe a mumble at best (and they don't believe he is able to do this even now), and has been sleeping more and more. We discussed what care looks like at end of life. His wife was very tearful throughout the conversation. We then discussed where this care might be delivered. They say that he cannot go home and are interested in a hospice facility. He is also having increasing pain and dyspnea of which I increased his frequency of Morphine. I will continue to follow and support holistically and manage symptoms.   Goals of Care: 1.  Code Status: DNR   2. Scope of Treatment: 1. Vital Signs: daily 2. Respiratory/Oxygen: for comfort 3. Nutritional Support/Tube Feeds: no 4. Antibiotics: no 5. Blood Products: no 6. IVF: no 7. Review of Medications to be discontinued: minimize for comfort 8. Labs: no 9. Telemetry: no 10. Consults: palliative   4.  Disposition: Hopeful for hospice facility.    3. Symptom Management:   1. Anxiety/Agitation: Lorazepam prn. 2. Pain: Morphine IV q1h prn. 3. Bowel Regimen: Dulcolax supp prn.  4. Nausea/Vomiting: Ondansetron prn.  4. Psychosocial: Emotional support provided to family during difficult conversation.    Brief HPI: 80 yo male with failure to thrive and COPD.    ROS: Unable to elicit - altered mental status.     PMH:  Past Medical History  Diagnosis Date  . Aortic valve stenosis, moderate     last echo April 4014   . Coronary artery disease     s/p PCI in 1995; Cath 2005 showed moderate 2 vessel CAD involving the distal Left main & LCx/OM, treated medically  . Hyperlipidemia   . Hypertension   . Vitamin B12 deficiency   . Neuropathy   . Allergic rhinitis   . Esophageal stricture   . Chronic kidney disease     over active bladder  . BPH (benign prostatic hypertrophy)     URINARY RETENTION-HAS INDWELLING FOLEY CATHETER  . Memory loss     short term memory loss which pt denies  . COPD (chronic obstructive pulmonary disease)   . Myocardial infarction 06/2012    "massive"  . Pneumonia     "3 times in the last 18 months" (04/17/2013)  . Dyspnea   . On home oxygen therapy     "2-4L; 24/7 " (04/17/2013)  . OA (osteoarthritis)     "right knee" (04/17/2013)  .  Dementia     "light" (04/17/2013)  . History of bladder cancer     "twice"   . Prostate cancer   . Skin cancer     "face; hands; arms" (04/17/2013)     PSH: Past Surgical History  Procedure Laterality Date  . Coronary angioplasty  1995  . Appendectomy    . Cholecystectomy    . Tonsillectomy    . Sigmoidoscopy  1999  . Colonoscopy    . Elbow surgery Bilateral     "tennis and golf elbows"   . Total knee arthroplasty Left     sees Dr. Gaynelle Arabian   . Carotid endarterectomy Left 08/2008  . Esophagogastroduodenoscopy      dilation  . Transurethral resection of prostate N/A 04/25/2012    Procedure: TRANSURETHRAL  RESECTION OF THE PROSTATE WITH GYRUS INSTRUMENTS;  Surgeon: Alexis Frock, MD;  Location: [facility];  Service: Urology;  Laterality: N/A;  . Cystoscopy with biopsy N/A 04/25/2012    Procedure: CYSTOSCOPY WITH BIOPSY;  Surgeon: Alexis Frock, MD;  Location: [facility];  Service: Urology;  Laterality: N/A;  . Coronary angioplasty with stent placement  1995; 06/2012    "1 + 1"  . Cataract extraction w/ intraocular lens  implant, bilateral Bilateral    I have reviewed the [place] and SH and  If appropriate update it with new information. Allergies  Allergen Reactions  . Sulfamethoxazole     REACTION: unspecified   Scheduled Meds: . sodium chloride  3 mL Intravenous Q12H   Continuous Infusions:  PRN Meds:.sodium chloride, albuterol, LORazepam, morphine injection, nitroGLYCERIN, ondansetron (ZOFRAN) IV, ondansetron, sodium chloride    BP 124/65  Pulse 87  Temp(Src) 98.2 F (36.8 C) (Oral)  Resp 19  Ht $R'5\' 10"'BJ$  (1.778 m)  Wt 78.2 kg (172 lb 6.4 oz)  BMI 24.74 kg/m2  SpO2 93%   PPS: 20%   Intake/Output Summary (Last 24 hours) at 04/18/13 1507 Last data filed at 04/18/13 1330  Gross per 24 hour  Intake     23 ml  Output    550 ml  Net   -527 ml   LBM: 04/17/13                 Physical Exam:  General: NAD, eyes open HEENT:  [place]/AT, dry mucous membranes, no JVD Chest: Bibasilar rales CVS: RRR, S1 S2 Abdomen: Soft, NT, ND, +BS Ext: No edema Neuro: Unable to follow commands or communicate  Labs: CBC    Component Value Date/Time   WBC 8.6 04/17/2013 1334   RBC 4.08* 04/17/2013 1334   HGB 13.7 04/17/2013 1334   HCT 39.3 04/17/2013 1334   PLT 115* 04/17/2013 1334   MCV 96.3 04/17/2013 1334   MCH 33.6 04/17/2013 1334   MCHC 34.9 04/17/2013 1334   RDW 13.2 04/17/2013 1334   LYMPHSABS 1.0 04/17/2013 1334   MONOABS 0.5 04/17/2013 1334   EOSABS 0.1 04/17/2013 1334   BASOSABS 0.0 04/17/2013 1334    BMET    Component Value Date/Time   NA 140 04/17/2013 1334   K 3.9 04/17/2013 1334   CL 94* 04/17/2013 1334    CO2 32 04/17/2013 1334   GLUCOSE 96 04/17/2013 1334   BUN 25* 04/17/2013 1334   CREATININE 1.42* 04/17/2013 1334   CALCIUM 8.6 04/17/2013 1334   GFRNONAA 41* 04/17/2013 1334   GFRAA 47* 04/17/2013 1334    CMP     Component Value Date/Time   NA 140 04/17/2013 1334   K 3.9 04/17/2013  1334   CL 94* 04/17/2013 1334   CO2 32 04/17/2013 1334   GLUCOSE 96 04/17/2013 1334   BUN 25* 04/17/2013 1334   CREATININE 1.42* 04/17/2013 1334   CALCIUM 8.6 04/17/2013 1334   PROT 5.7* 06/22/2012 0525   ALBUMIN 2.7* 06/22/2012 0525   AST 64* 06/22/2012 0525   ALT 22 06/22/2012 0525   ALKPHOS 52 06/22/2012 0525   BILITOT 0.5 06/22/2012 0525   GFRNONAA 41* 04/17/2013 1334   GFRAA 47* 04/17/2013 1334     Time In Time Out Total Time Spent with Patient Total Overall Time  1500 1600 23min 28min    Greater than 50%  of this time was spent counseling and coordinating care related to the above assessment and plan.  Vinie Sill, NP Palliative Medicine Team Pager # [phone] Team Phone # [phone]

## 2013-04-19 DIAGNOSIS — F3289 Other specified depressive episodes: Secondary | ICD-10-CM

## 2013-04-19 DIAGNOSIS — R1310 Dysphagia, unspecified: Secondary | ICD-10-CM

## 2013-04-19 DIAGNOSIS — F329 Major depressive disorder, single episode, unspecified: Secondary | ICD-10-CM

## 2013-04-19 DIAGNOSIS — J449 Chronic obstructive pulmonary disease, unspecified: Principal | ICD-10-CM

## 2013-04-19 DIAGNOSIS — R05 Cough: Secondary | ICD-10-CM

## 2013-04-19 DIAGNOSIS — R269 Unspecified abnormalities of gait and mobility: Secondary | ICD-10-CM

## 2013-04-19 DIAGNOSIS — R059 Cough, unspecified: Secondary | ICD-10-CM

## 2013-04-19 MED ORDER — ACETAMINOPHEN 650 MG RE SUPP
650.0000 mg | Freq: Four times a day (QID) | RECTAL | Status: DC | PRN
  Administered 2013-04-19 – 2013-04-20 (×2): 650 mg via RECTAL
  Filled 2013-04-19 (×2): qty 1

## 2013-04-19 MED ORDER — SCOPOLAMINE 1 MG/3DAYS TD PT72
1.0000 | MEDICATED_PATCH | TRANSDERMAL | Status: DC
  Administered 2013-04-19 – 2013-04-22 (×2): 1.5 mg via TRANSDERMAL
  Filled 2013-04-19 (×3): qty 1

## 2013-04-19 MED ORDER — ATROPINE SULFATE 1 % OP SOLN: 4.0000 [drp] | OPHTHALMIC | Status: DC | PRN

## 2013-04-19 NOTE — Progress Notes (Signed)
Progress Note from the Palliative Medicine Team at [place]: Thomas [name] appears much more lethargic today than yesterday. He is not alert enough at this time for any amount of intake. The family says that he took 3 bites of breakfast and he has not had any amount of intake more than a few bites since last Friday. We discussed comfort feeds and that we can give him bites if he is alert and sitting up for comfort. They understand his high risk of aspiration. We discussed how he is more lethargic today (even without medication on board) and that this can be a sign of getting close to his death and how people sleep more and more and eat/drink less and less. I discussed with nursing that we can optimize his morphine for his dyspnea. They are hopeful for hospice facility.   Objective: Allergies  Allergen Reactions  . Sulfamethoxazole     REACTION: unspecified   Scheduled Meds: . sodium chloride  3 mL Intravenous Q12H   Continuous Infusions:  PRN Meds:.sodium chloride, acetaminophen, albuterol, bisacodyl, LORazepam, morphine injection, nitroGLYCERIN, ondansetron (ZOFRAN) IV, ondansetron, sodium chloride  BP 146/76  Pulse 111  Temp(Src) 102.3 F (39.1 C) (Axillary)  Resp 27  Ht 5\' 10"  (1.778 m)  Wt 78.2 kg (172 lb 6.4 oz)  BMI 24.74 kg/m2  SpO2 90%   PPS: 20%     Intake/Output Summary (Last 24 hours) at 04/19/13 1044 Last data filed at 04/18/13 1949  Gross per 24 hour  Intake      0 ml  Output      0 ml  Net      0 ml      LBM: 04/17/13     Physical Exam:  General: NAD, resting  HEENT:  [place]/AT, no JVD, dry mucous membranes Chest: Bibasilar rales CVS: RRR, S1 S2 Abdomen: Soft, NT, ND, +BS Ext: No edema, warm to touch Neuro: Lethargic, arousable to loud voice for only a moment  Labs: CBC    Component Value Date/Time   WBC 8.6 04/17/2013 1334   RBC 4.08* 04/17/2013 1334   HGB 13.7 04/17/2013 1334   HCT 39.3 04/17/2013 1334   PLT 115* 04/17/2013 1334   MCV 96.3  04/17/2013 1334   MCH 33.6 04/17/2013 1334   MCHC 34.9 04/17/2013 1334   RDW 13.2 04/17/2013 1334   LYMPHSABS 1.0 04/17/2013 1334   MONOABS 0.5 04/17/2013 1334   EOSABS 0.1 04/17/2013 1334   BASOSABS 0.0 04/17/2013 1334    BMET    Component Value Date/Time   NA 140 04/17/2013 1334   K 3.9 04/17/2013 1334   CL 94* 04/17/2013 1334   CO2 32 04/17/2013 1334   GLUCOSE 96 04/17/2013 1334   BUN 25* 04/17/2013 1334   CREATININE 1.42* 04/17/2013 1334   CALCIUM 8.6 04/17/2013 1334   GFRNONAA 41* 04/17/2013 1334   GFRAA 47* 04/17/2013 1334    CMP     Component Value Date/Time   NA 140 04/17/2013 1334   K 3.9 04/17/2013 1334   CL 94* 04/17/2013 1334   CO2 32 04/17/2013 1334   GLUCOSE 96 04/17/2013 1334   BUN 25* 04/17/2013 1334   CREATININE 1.42* 04/17/2013 1334   CALCIUM 8.6 04/17/2013 1334   PROT 5.7* 06/22/2012 0525   ALBUMIN 2.7* 06/22/2012 0525   AST 64* 06/22/2012 0525   ALT 22 06/22/2012 0525   ALKPHOS 52 06/22/2012 0525   BILITOT 0.5 06/22/2012 0525   GFRNONAA 41* 04/17/2013 1334  GFRAA 47* 04/17/2013 1334     Assessment and Plan: 1. Code Status: DNR 2. Symptom Control: 1.  Anxiety/Agitation: Lorazepam prn. 2. Pain: Morphine IV q1h prn. 3. Bowel Regimen: Dulcolax supp prn.  4. Nausea/Vomiting: Ondansetron prn. 5. Terminal Secretions: Scopolamine patch. Atropine SL prn.  3. Psycho/Social: Emotional support provided to family. 4. Disposition: Hopeful for hospice facility.     Time In Time Out Total Time Spent with Patient Total Overall Time  1300 1345 4min 5min    Greater than 50%  of this time was spent counseling and coordinating care related to the above assessment and plan.  Vinie Sill, NP Palliative Medicine Team Pager # [phone] Team Phone # [phone]   1

## 2013-04-19 NOTE — Progress Notes (Signed)
[facility] PROGRESS NOTE  Thomas [name] [id number]:[id number] DOB: 1917/02/06 DOA: 04/17/2013 PCP: [name], MD  Assessment/Plan:  80 y.o. male with history of aortic stenosis, coronary artery disease, dyslipidemia, hypertension, chronic kidney disease, COPD and per family recent MI. Presents to the ED at patient's request secondary to worsening shortness of breath. Father had discussions with family and his wishes currently are for comfort care measures. Much of the history is obtained from the daughters report that he is tired of being tired. Reportedly patient has had multiple hospital admissions for pneumonias and his condition has progressively gotten worse. To the point where he is having difficulty breathing and has gotten weaker.   Assessment/Plan Active Problems:  Dyspnea/Failure to thrive in adult  - Dr. Wendee Beavers discussed with daughters at bedside and patient at this point it has been decided for comfort care measures. This means no aggressive measures meaning no frequent lab checks no IV fluids or antibiotics. With the goals to be comfort care.  -plan on providing supplemental oxygen, morphine and Ativan for discomfort and anxiety related to shortness of breath  - also have anti-emetics and albuterol on board  appreciate palliative care input, plan hospice   Code Status: DNR Family Communication: d/w patient, no family at the bedside; plan to contact later (indicate person spoken with, relationship, and if by phone, the number) Disposition Plan: pend clinical improvement    Consultants:  Palliative   Procedures:  none  Antibiotics:  None  (indicate start date, and stop date if known)  HPI/Subjective: Lethargic   Objective: Filed Vitals:   04/19/13 0554  BP: 146/76  Pulse: 111  Temp: 102.3 F (39.1 C)  Resp: 27    Intake/Output Summary (Last 24 hours) at 04/19/13 0829 Last data filed at 04/18/13 1949  Gross per 24 hour  Intake     23 ml  Output      0  ml  Net     23 ml   Filed Weights   04/17/13 1329 04/17/13 1702  Weight: 78.019 kg (172 lb) 78.2 kg (172 lb 6.4 oz)    Exam:   General:  Lethargic   Cardiovascular: s1,s2 rrr  Respiratory: few rales at bases   Abdomen: spft, nt, nd   Musculoskeletal: no edema in LL   Data Reviewed: Basic Metabolic Panel:  Recent Labs Lab 04/17/13 1334  NA 140  K 3.9  CL 94*  CO2 32  GLUCOSE 96  BUN 25*  CREATININE 1.42*  CALCIUM 8.6   Liver Function Tests: No results found for this basename: AST, ALT, ALKPHOS, BILITOT, PROT, ALBUMIN,  in the last 168 hours No results found for this basename: LIPASE, AMYLASE,  in the last 168 hours No results found for this basename: AMMONIA,  in the last 168 hours CBC:  Recent Labs Lab 04/17/13 1334  WBC 8.6  NEUTROABS 6.9  HGB 13.7  HCT 39.3  MCV 96.3  PLT 115*   Cardiac Enzymes: No results found for this basename: CKTOTAL, CKMB, CKMBINDEX, TROPONINI,  in the last 168 hours BNP (last 3 results)  Recent Labs  06/21/12 0913 03/29/13 0556 04/17/13 1334  PROBNP 8040.0* 3684.0* 5602.0*   CBG: No results found for this basename: GLUCAP,  in the last 168 hours  No results found for this or any previous visit (from the past 240 hour(s)).   Studies: Dg Chest Portable 1 View  04/17/2013   CLINICAL DATA:  Shortness of Breath  EXAM: PORTABLE CHEST - 1 VIEW  COMPARISON:  March 28, 2013  FINDINGS: There is a small area of infiltrate in the lateral left base. Lungs are otherwise clear. Heart is enlarged with normal pulmonary vascularity, stable. No adenopathy. There is atherosclerotic change in the aorta. There is extensive arthropathy in both shoulders.  IMPRESSION: Small area of infiltrate left base laterally. Lungs otherwise clear. Stable cardiomegaly.   Electronically Signed   By: Lowella Grip M.D.   On: 04/17/2013 14:05    Scheduled Meds: . sodium chloride  3 mL Intravenous Q12H   Continuous Infusions:   Active Problems:    Dyspnea   Failure to thrive in adult   Palliative care encounter    Time spent: >35 minutes     Kinnie Feil  [facility] Pager [phone]. If 7PM-7AM, please contact night-coverage at [email or web address], password [facility] 04/19/2013, 8:29 AM  LOS: 2 days

## 2013-04-19 NOTE — Progress Notes (Signed)
Patient changed to inpatient-requiring IV pain medication for comfort.

## 2013-04-19 NOTE — Progress Notes (Signed)
CSW receved message back from [facility] that no beds available at this time.   CSW explained information to family and requested permission to send Pt information to [facility]. Pt's wife and daughter did give permission, however are hopeful for placement at [facility].    CSW will continue to follow Pt for d/c planning.    [facility]  [address];  [phone] Phone: [phone]

## 2013-04-19 NOTE — Progress Notes (Signed)
Nutrition Brief Note  Patient identified due to Low Braden Score   Wt Readings from Last 15 Encounters:  04/17/13 172 lb 6.4 oz (78.2 kg)  04/10/13 172 lb (78.019 kg)  04/01/13 178 lb 9.2 oz (81 kg)  01/08/13 171 lb (77.565 kg)  06/26/12 140 lb 3.4 oz (63.6 kg)  06/26/12 140 lb 3.4 oz (63.6 kg)  05/29/12 143 lb 3.2 oz (64.955 kg)  05/08/12 143 lb 1.3 oz (64.9 kg)  04/25/12 145 lb 15.1 oz (66.2 kg)  04/25/12 145 lb 15.1 oz (66.2 kg)  04/20/12 146 lb (66.225 kg)  01/02/12 143 lb 1.3 oz (64.9 kg)  12/23/11 143 lb (64.864 kg)  12/13/11 143 lb 14.4 oz (65.273 kg)  12/08/11 147 lb 6.4 oz (66.86 kg)   Current diet order is Dysphagia 1, patient is consuming approximately 0% of meals at this time. Per pt's chart, pt and family have decided for comfort care measures. Per palliative notes, no nutrition support/tube feeds.  No nutrition interventions warranted at this time. If nutrition issues arise, please consult RD.   Pryor Ochoa RD, LDN Inpatient Clinical Dietitian Pager: [phone] After Hours Pager: [phone]

## 2013-04-19 NOTE — Progress Notes (Signed)
Clinical Social Work Department BRIEF PSYCHOSOCIAL ASSESSMENT 04/19/2013  Patient:  Thomas [name], Thomas [name]     Account Number:  [id number]     Admit date:  04/17/2013  Clinical Social Worker:  Pete Pelt, CLINICAL SOCIAL WORKER  Date/Time:  04/19/2013 03:26 PM  Referred by:  Physician  Date Referred:  04/19/2013 Referred for  Residential hospice placement   Other Referral:   Interview type:  Family Other interview type:   CSW also spoke with Pt wife and daughter at the bedside:    Russ Looper [phone]  (wife)  Mercer Pod  [phone]  (daughter)    PSYCHOSOCIAL DATA Living Status:  WIFE Admitted from facility:   Level of care:   Primary support name:  Zyrus Hetland  [phone] Primary support relationship to patient:  SPOUSE Degree of support available:   Pt has a good support from family and friends.    CURRENT CONCERNS Current Concerns  Post-Acute Placement   Other Concerns:    SOCIAL WORK ASSESSMENT / PLAN CSW met with Pt wife and daughter at the bedside to discuss the referral revceived for residential hospice placement. CSW intorduced self to family. Pt was unable to participate in the assessment at this time. CSW informed family that a consult was recieved for Residential  Hospice placement. CSW explained the referral process and transition into Hospice care. CSW requested permission to send Pt information to local Hospice ([place], [place]) and the surrounding counties. CSW explained that family would need to consider an alternate placement option in the event that [facility] were not available. CSW encouraged the family to be open to alternate placement and assured family that CSW will seek placement at the closest facility available. Pt's family stated that he has been in and out of the hospital for several months and they would just rather for him to be closer to home.   Assessment/plan status:  Information/Referral to [facility] Other assessment/  plan:   Information/referral to community resources:   CSW provided a Equities trader for the local and surrounding areas.    PATIENT'S/FAMILY'S RESPONSE TO PLAN OF CARE: Pt's wife was appreciative for assistance with placement and stated that she "hopes we can be at [facility] so I don't have to go so far." CSW was reassurative that [facility] will remain their first choice and placement will be as soon as a bed is available and the Pt is medically stable for transportation.        [facility]  [address]N 1[address]16;  [phone] Phone: [phone]

## 2013-04-20 DIAGNOSIS — I359 Nonrheumatic aortic valve disorder, unspecified: Secondary | ICD-10-CM

## 2013-04-20 DIAGNOSIS — J189 Pneumonia, unspecified organism: Secondary | ICD-10-CM

## 2013-04-20 DIAGNOSIS — J9600 Acute respiratory failure, unspecified whether with hypoxia or hypercapnia: Secondary | ICD-10-CM

## 2013-04-20 DIAGNOSIS — I5031 Acute diastolic (congestive) heart failure: Secondary | ICD-10-CM

## 2013-04-20 NOTE — Progress Notes (Addendum)
[facility] PROGRESS NOTE  BYRL LATIN RSW:[id number] DOB: 1916-12-08 DOA: 04/17/2013 PCP: [name], MD  Assessment/Plan:  80 y.o. male with history of aortic stenosis, coronary artery disease, dyslipidemia, hypertension, chronic kidney disease, COPD and per family recent MI. Presents to the ED at patient's request secondary to worsening shortness of breath. Father had discussions with family and his wishes currently are for comfort care measures. Much of the history is obtained from the daughters report that he is tired of being tired. Reportedly patient has had multiple hospital admissions for pneumonias and his condition has progressively gotten worse. To the point where he is having difficulty breathing and has gotten weaker.   Assessment/Plan Active Problems:  Dyspnea/Failure to thrive in adult, CAP, COPD, CAD, CHF  - discussed with daughters at bedside and patient at this point it has been decided for comfort care measures. This means no aggressive measures meaning no frequent lab checks no IV fluids or antibiotics. With the goals to be comfort care.  -plan on providing supplemental oxygen, morphine and Ativan for discomfort and anxiety related to shortness of breath  - patient seem comfortable; have anti-emetics and albuterol on board  -appreciate palliative care input, plan hospice   Code Status: DNR Family Communication: d/w patient, no family at the bedside; plan to contact later (indicate person spoken with, relationship, and if by phone, the number) Disposition Plan: per hospice   Consultants:  Palliative   Procedures:  none  Antibiotics:  None  (indicate start date, and stop date if known)  HPI/Subjective: Lethargic   Objective: Filed Vitals:   04/20/13 0800  BP:   Pulse:   Temp: 100.2 F (37.9 C)  Resp:     Intake/Output Summary (Last 24 hours) at 04/20/13 0955 Last data filed at 04/20/13 0548  Gross per 24 hour  Intake      0 ml  Output     650 ml  Net   -650 ml   Filed Weights   04/17/13 1329 04/17/13 1702  Weight: 78.019 kg (172 lb) 78.2 kg (172 lb 6.4 oz)    Exam:   General:  Lethargic   Cardiovascular: s1,s2 rrr  Respiratory: few rales at bases   Abdomen: spft, nt, nd   Musculoskeletal: no edema in LL   Data Reviewed: Basic Metabolic Panel:  Recent Labs Lab 04/17/13 1334  NA 140  K 3.9  CL 94*  CO2 32  GLUCOSE 96  BUN 25*  CREATININE 1.42*  CALCIUM 8.6   Liver Function Tests: No results found for this basename: AST, ALT, ALKPHOS, BILITOT, PROT, ALBUMIN,  in the last 168 hours No results found for this basename: LIPASE, AMYLASE,  in the last 168 hours No results found for this basename: AMMONIA,  in the last 168 hours CBC:  Recent Labs Lab 04/17/13 1334  WBC 8.6  NEUTROABS 6.9  HGB 13.7  HCT 39.3  MCV 96.3  PLT 115*   Cardiac Enzymes: No results found for this basename: CKTOTAL, CKMB, CKMBINDEX, TROPONINI,  in the last 168 hours BNP (last 3 results)  Recent Labs  06/21/12 0913 03/29/13 0556 04/17/13 1334  PROBNP 8040.0* 3684.0* 5602.0*   CBG: No results found for this basename: GLUCAP,  in the last 168 hours  No results found for this or any previous visit (from the past 240 hour(s)).   Studies: No results found.  Scheduled Meds: . scopolamine  1 patch Transdermal Q72H  . sodium chloride  3 mL Intravenous Q12H  Continuous Infusions:   Active Problems:   Dyspnea   Failure to thrive in adult   Palliative care encounter    Time spent: >35 minutes     Kinnie Feil  [facility] Pager [phone]. If 7PM-7AM, please contact night-coverage at [email or web address], password [facility] 04/20/2013, 9:55 AM  LOS: 3 days

## 2013-04-20 NOTE — Progress Notes (Addendum)
Patient DG:UYQIHK ERMAN THUM      DOB: January 15, 1917      VQQ:[id number]   Palliative Medicine Team at [facility] Progress Note    Subjective: patient is not responsive.  His brother is at the bedside.  He is in no acute distress.  He is not eating  Filed Vitals:   04/20/13 0835  BP:   Pulse:   Temp: 99.5 F (37.5 C)  Resp:    Physical exam: General: eyes closed no acute distress Pupils not examined, brow not furrowed Chest decreased with some crackles CVs: distant , S1, S2 Abd: obese, not tender Ext warm , edema thorughout Neuro: not resposive to tactile or verbal stimuli    Assessment and plan: 80 yr old white male with admission for failure to thrive and worsening shortness of breath in the face of COPD, Aortic stenosis , and left basilar infiltrate.  Patient and family have elected comfort care.  He is awaiting transfer to hospice home when bed available.  1.  DNR comfort care  2.  Dyspnea: prn morphine.  Patient has only needed 3 mg in last 24 hours  3.  Anxiety Prn ativan   Total time : 15 min   Lakelynn Severtson L. Lovena Le, MD MBA The Palliative Medicine Team at [facility] Phone: [phone] Pager: [phone]

## 2013-04-21 NOTE — Progress Notes (Signed)
Pt alert and awake. Responding to family and answering questions at times.  Denies pain, respirations regular and unlabored.

## 2013-04-21 NOTE — Progress Notes (Signed)
[facility] PROGRESS NOTE  Thomas [name] UYQ:[id number] DOB: 09-27-1916 DOA: 04/17/2013 PCP: [name], MD  Assessment/Plan:  80 y.o. male with history of aortic stenosis, coronary artery disease, dyslipidemia, hypertension, chronic kidney disease, COPD and per family recent MI. Presents to the ED at patient's request secondary to worsening shortness of breath. Father had discussions with family and his wishes currently are for comfort care measures. Much of the history is obtained from the daughters report that he is tired of being tired. Reportedly patient has had multiple hospital admissions for pneumonias and his condition has progressively gotten worse. To the point where he is having difficulty breathing and has gotten weaker.   Assessment/Plan Active Problems:  Dyspnea/Failure to thrive in adult, CAP, COPD, CAD, CHF, febrile   -discussed with daughters at bedside and patient at this point it has been decided for comfort care measures. This means no aggressive measures meaning no frequent lab checks no IV fluids or antibiotics. With the goals to be comfort care.  -plan on providing supplemental oxygen, morphine and Ativan for discomfort and anxiety related to shortness of breath  - patient seem comfortable;cont comfort care   -appreciate palliative care input, plan hospice   Code Status: DNR Family Communication: d/w patient, no family at the bedside; plan to contact later (indicate person spoken with, relationship, and if by phone, the number) Disposition Plan: per hospice   Consultants:  Palliative   Procedures:  none  Antibiotics:  None  (indicate start date, and stop date if known)  HPI/Subjective: Lethargic   Objective: Filed Vitals:   04/21/13 0537  BP: 94/60  Pulse: 79  Temp: 99.3 F (37.4 C)  Resp: 20    Intake/Output Summary (Last 24 hours) at 04/21/13 0940 Last data filed at 04/21/13 0537  Gross per 24 hour  Intake    120 ml  Output     550 ml  Net   -430 ml   Filed Weights   04/17/13 1329 04/17/13 1702  Weight: 78.019 kg (172 lb) 78.2 kg (172 lb 6.4 oz)    Exam:   General:  Lethargic   Cardiovascular: s1,s2 rrr  Respiratory: few rales at bases   Abdomen: spft, nt, nd   Musculoskeletal: no edema in LL   Data Reviewed: Basic Metabolic Panel:  Recent Labs Lab 04/17/13 1334  NA 140  K 3.9  CL 94*  CO2 32  GLUCOSE 96  BUN 25*  CREATININE 1.42*  CALCIUM 8.6   Liver Function Tests: No results found for this basename: AST, ALT, ALKPHOS, BILITOT, PROT, ALBUMIN,  in the last 168 hours No results found for this basename: LIPASE, AMYLASE,  in the last 168 hours No results found for this basename: AMMONIA,  in the last 168 hours CBC:  Recent Labs Lab 04/17/13 1334  WBC 8.6  NEUTROABS 6.9  HGB 13.7  HCT 39.3  MCV 96.3  PLT 115*   Cardiac Enzymes: No results found for this basename: CKTOTAL, CKMB, CKMBINDEX, TROPONINI,  in the last 168 hours BNP (last 3 results)  Recent Labs  06/21/12 0913 03/29/13 0556 04/17/13 1334  PROBNP 8040.0* 3684.0* 5602.0*   CBG: No results found for this basename: GLUCAP,  in the last 168 hours  No results found for this or any previous visit (from the past 240 hour(s)).   Studies: No results found.  Scheduled Meds: . scopolamine  1 patch Transdermal Q72H  . sodium chloride  3 mL Intravenous Q12H   Continuous Infusions:  Active Problems:   Dyspnea   Failure to thrive in adult   Palliative care encounter    Time spent: >35 minutes     Kinnie Feil  [facility] Pager [phone]. If 7PM-7AM, please contact night-coverage at [email or web address], password [facility] 04/21/2013, 9:40 AM  LOS: 4 days

## 2013-04-21 NOTE — Progress Notes (Addendum)
Per [place] liaison there are no residential hospice beds at this time. Clinical Social Worker sent referral to [facility] residential hospice and is waiting on return call.  Blima Rich, LCSWA Weekend CSW [phone]   Per [place] liaison spoke with patient's wife [name] who is going to meet with her family after church today and decide if they want to go with [facility]. Salomon Fick is waiting on wife to call. CSW will continue to [place], [place] Weekend CSW [phone]

## 2013-04-22 DIAGNOSIS — J441 Chronic obstructive pulmonary disease with (acute) exacerbation: Secondary | ICD-10-CM

## 2013-04-22 MED ORDER — MORPHINE SULFATE 20 MG/5ML PO SOLN: 5.0000 mg | ORAL | Status: AC | PRN

## 2013-04-22 MED ORDER — LORAZEPAM 1 MG PO TABS: 1.0000 mg | ORAL_TABLET | Freq: Three times a day (TID) | ORAL | Status: AC

## 2013-04-22 MED ORDER — ACETAMINOPHEN 650 MG RE SUPP: 650.0000 mg | Freq: Four times a day (QID) | RECTAL | Status: AC | PRN

## 2013-04-22 MED ORDER — SCOPOLAMINE 1 MG/3DAYS TD PT72: 1.0000 | MEDICATED_PATCH | TRANSDERMAL | Status: AC

## 2013-04-22 NOTE — Progress Notes (Signed)
Thomas [name] appears more at ease this morning and is resting comfortably. His wife and daughter-in-law are at bedside. They are prepared to transfer to [facility] when transport arrives. They say they are appreciative of the care he has received here. I provided emotional support to his wife as she is very tearful.   Vinie Sill, NP Palliative Medicine Team Pager # [phone] Team Phone # [phone]

## 2013-04-22 NOTE — Discharge Summary (Signed)
Physician Discharge Summary  ED Thomas [name]:[id number] DOB: 1916/05/19 DOA: 04/17/2013  PCP: [name], MD  Admit date: 04/17/2013 Discharge date: 04/22/2013  Time spent: >35 minutes  Recommendations for Outpatient Follow-up:  Hospice care  Discharge Diagnoses:  Active Problems:   Dyspnea   Failure to thrive in adult   Palliative care encounter   Discharge Condition: stable   Diet recommendation: comfort   Filed Weights   04/17/13 1329 04/17/13 1702  Weight: 78.019 kg (172 lb) 78.2 kg (172 lb 6.4 oz)    History of present illness:  80 y.o. male with history of aortic stenosis, coronary artery disease, dyslipidemia, hypertension, chronic kidney disease, COPD and per family recent MI. Presents to the ED at patient's request secondary to worsening shortness of breath. Father had discussions with family and his wishes currently are for comfort care measures. Much of the history is obtained from the daughters report that he is tired of being tired. Reportedly patient has had multiple hospital admissions for pneumonias and his condition has progressively gotten worse. To the point where he is having difficulty breathing and has gotten weaker.   Hospital Course:  Assessment/Plan  Active Problems:  Dyspnea/Failure to thrive in adult, CAP, COPD, CAD, CHF, febrile  -discussed with daughters at bedside and patient at this point it has been decided for comfort care measures. This means no aggressive measures meaning no frequent lab checks no IV fluids or antibiotics, stop all unnecessary meds  With the goals to be comfort care.  -plan to provide comfort care; hospice   Prognosis is poor   Procedures:  none  (i.e. Studies not automatically included, echos, thoracentesis, etc; not x-rays)  Consultations:  Palliative care   Discharge Exam: Filed Vitals:   04/22/13 0601  BP: 123/61  Pulse: 66  Temp: 98.9 F (37.2 C)  Resp: 20    General: alert Cardiovascular: s1,s2  rrr Respiratory: few crackles in LL  Discharge Instructions  Discharge Orders   Future Orders Complete By Expires   Diet - low sodium heart healthy  As directed    Discharge instructions  As directed    Comments:     Please follow up with hospice care   Increase activity slowly  As directed        Medication List    STOP taking these medications       aspirin 81 MG chewable tablet     atorvastatin 40 MG tablet  Commonly known as:  LIPITOR     budesonide-formoterol 160-4.5 MCG/ACT inhaler  Commonly known as:  SYMBICORT     clopidogrel 75 MG tablet  Commonly known as:  PLAVIX     diltiazem 180 MG 24 hr capsule  Commonly known as:  CARDIZEM CD     fluticasone 50 MCG/ACT nasal spray  Commonly known as:  FLONASE     furosemide 40 MG tablet  Commonly known as:  LASIX     gabapentin 100 MG capsule  Commonly known as:  NEURONTIN     ipratropium 0.02 % nebulizer solution  Commonly known as:  ATROVENT     polyethylene glycol packet  Commonly known as:  MIRALAX / GLYCOLAX     predniSONE 10 MG tablet  Commonly known as:  DELTASONE     sennosides-docusate sodium 8.6-50 MG tablet  Commonly known as:  SENOKOT-S     tamsulosin 0.4 MG Caps capsule  Commonly known as:  FLOMAX     temazepam 30 MG capsule  Commonly known as:  RESTORIL     traMADol 50 MG tablet  Commonly known as:  ULTRAM      TAKE these medications       acetaminophen 650 MG suppository  Commonly known as:  TYLENOL  Place 1 suppository (650 mg total) rectally every 6 (six) hours as needed for fever.     albuterol (2.5 MG/3ML) 0.083% nebulizer solution  Commonly known as:  PROVENTIL  Take 3 mLs (2.5 mg total) by nebulization every 4 (four) hours as needed for wheezing or shortness of breath.     escitalopram 10 MG tablet  Commonly known as:  LEXAPRO  Take 20 mg by mouth daily.     feeding supplement (ENSURE COMPLETE) Liqd  Take 120 mLs by mouth 2 (two) times daily between meals.      latanoprost 0.005 % ophthalmic solution  Commonly known as:  XALATAN  Place 1 drop into both eyes at bedtime.     LORazepam 1 MG tablet  Commonly known as:  ATIVAN  Take 1 tablet (1 mg total) by mouth every 8 (eight) hours.     morphine 20 MG/5ML solution  Take 1.3 mLs (5.2 mg total) by mouth every 2 (two) hours as needed for pain.     multivitamin with minerals Tabs tablet  Take 1 tablet by mouth daily.     NITROSTAT 0.4 MG SL tablet  Generic drug:  nitroGLYCERIN  place 1 tablet by mouth under the tongue every 5 minutes if needed     scopolamine 1.5 MG  Commonly known as:  TRANSDERM-SCOP  Place 1 patch (1.5 mg total) onto the skin every 3 (three) days.     trolamine salicylate 10 % cream  Commonly known as:  ASPERCREME  Apply 1 application topically 4 (four) times daily -  before meals and at bedtime.       Allergies  Allergen Reactions  . Sulfamethoxazole     REACTION: unspecified      The results of significant diagnostics from this hospitalization (including imaging, microbiology, ancillary and laboratory) are listed below for reference.    Significant Diagnostic Studies: Dg Chest 2 View (if Patient Has Fever And/or Copd)  03/28/2013   CLINICAL DATA:  Cough.  EXAM: CHEST  2 VIEW  COMPARISON:  06/21/2012  FINDINGS: Cardiomegaly which is chronic. There is aortic tortuosity. Lungs are better aerated than previously. There is increased markings at the bases which is likely from hypoaeration. No effusion or pneumothorax. No acute osseous findings.  IMPRESSION: Mildly low lung volumes.  No edema or consolidation.   Electronically Signed   By: Jorje Guild M.D.   On: 03/28/2013 02:19   Nm Pulmonary Perf And Vent  03/28/2013   CLINICAL DATA:  Hypoxia.  Elevated D-dimer.  EXAM: NUCLEAR MEDICINE VENTILATION - PERFUSION LUNG SCAN  TECHNIQUE: Ventilation images were obtained in multiple projections using inhaled aerosol technetium 99 M DTPA. Perfusion images were obtained in  multiple projections after intravenous injection of Tc-106m MAA.  COMPARISON:  PA and lateral chest 03/28/2013.  RADIOPHARMACEUTICALS:  40 mCi Tc-91m DTPA aerosol and 5 mCi Tc-34m MAA  FINDINGS: Ventilation: No focal ventilation defect.  Perfusion: No wedge shaped peripheral perfusion defects to suggest acute pulmonary embolism.  IMPRESSION: Negative for pulmonary embolus.   Electronically Signed   By: Inge Rise M.D.   On: 03/28/2013 17:05   Dg Chest Portable 1 View  04/17/2013   CLINICAL DATA:  Shortness of Breath  EXAM: PORTABLE CHEST - 1 VIEW  COMPARISON:  March 28, 2013  FINDINGS: There is a small area of infiltrate in the lateral left base. Lungs are otherwise clear. Heart is enlarged with normal pulmonary vascularity, stable. No adenopathy. There is atherosclerotic change in the aorta. There is extensive arthropathy in both shoulders.  IMPRESSION: Small area of infiltrate left base laterally. Lungs otherwise clear. Stable cardiomegaly.   Electronically Signed   By: Lowella Grip M.D.   On: 04/17/2013 14:05    Microbiology: No results found for this or any previous visit (from the past 240 hour(s)).   Labs: Basic Metabolic Panel:  Recent Labs Lab 04/17/13 1334  NA 140  K 3.9  CL 94*  CO2 32  GLUCOSE 96  BUN 25*  CREATININE 1.42*  CALCIUM 8.6   Liver Function Tests: No results found for this basename: AST, ALT, ALKPHOS, BILITOT, PROT, ALBUMIN,  in the last 168 hours No results found for this basename: LIPASE, AMYLASE,  in the last 168 hours No results found for this basename: AMMONIA,  in the last 168 hours CBC:  Recent Labs Lab 04/17/13 1334  WBC 8.6  NEUTROABS 6.9  HGB 13.7  HCT 39.3  MCV 96.3  PLT 115*   Cardiac Enzymes: No results found for this basename: CKTOTAL, CKMB, CKMBINDEX, TROPONINI,  in the last 168 hours BNP: BNP (last 3 results)  Recent Labs  06/21/12 0913 03/29/13 0556 04/17/13 1334  PROBNP 8040.0* 3684.0* 5602.0*   CBG: No results  found for this basename: GLUCAP,  in the last 168 hours     Signed:  Rowe Clack N  [facility] 04/22/2013, 7:30 AM

## 2013-04-22 NOTE — Consult Note (Signed)
[place] Liaison: Spoke with spouse by phone to confirm interest/desire for transfer. Will meet with her at 10am this morning. MD aware and discharge summary has been faxed. Msg sent to CSW to inform. RNCM aware. Please arrange transport for Thomas [name] to arrive before noon. Thank you. Erling Conte LCSW [phone]

## 2013-04-22 NOTE — Plan of Care (Signed)
Problem: Discharge Progression Outcomes Goal: Discharge home/Hospice/SNF Outcome: Completed/Met Date Met:  04/22/13 Discharged to [facility]

## 2013-04-22 NOTE — Clinical Social Work Note (Signed)
Per MD patient ready to DC to [facility]. Ambulance transport requested for patient. Packet left on chart. CSW signing off at this time.  Liz [place], [place], [place], [phone]

## 2013-04-22 NOTE — Consult Note (Signed)
[facility] Liaison: [facility] available today for Mr. Capriotti if transfer still makes sense and family agreeable. Will update CSW and contact family at 8:30. Will need discharge summary faxed to [phone] and RN to call report to [phone]. Thank you. Erling Conte LCSW [phone]

## 2013-04-22 NOTE — Progress Notes (Signed)
Pt discharged in stable condition via EMS to [facility].   Discharge packet sent with pt and report called.  Thomas [name]

## 2013-04-27 ENCOUNTER — Other Ambulatory Visit: Payer: Self-pay | Admitting: Family Medicine

## 2013-05-04 ENCOUNTER — Telehealth: Payer: Self-pay | Admitting: Family Medicine

## 2013-05-04 NOTE — Telephone Encounter (Signed)
[place] requesting script for escitalopram (LEXAPRO) 10 MG tablet

## 2013-05-07 MED ORDER — ESCITALOPRAM OXALATE 10 MG PO TABS: 20.0000 mg | ORAL_TABLET | Freq: Every day | ORAL | Status: AC

## 2013-05-07 NOTE — Telephone Encounter (Signed)
done

## 2013-05-12 DEATH — deceased

## 2013-08-28 DIAGNOSIS — Z0289 Encounter for other administrative examinations: Secondary | ICD-10-CM

## 2014-02-20 ENCOUNTER — Encounter (HOSPITAL_COMMUNITY): Payer: Self-pay | Admitting: Cardiovascular Disease

## 2014-09-19 IMAGING — CR DG CHEST 2V
2 series · 2 of 2 positions shown · non-contrast
Comparison: 06/21/2012

CLINICAL DATA: Cough.

EXAM:
CHEST  2 VIEW

[w chest lat]
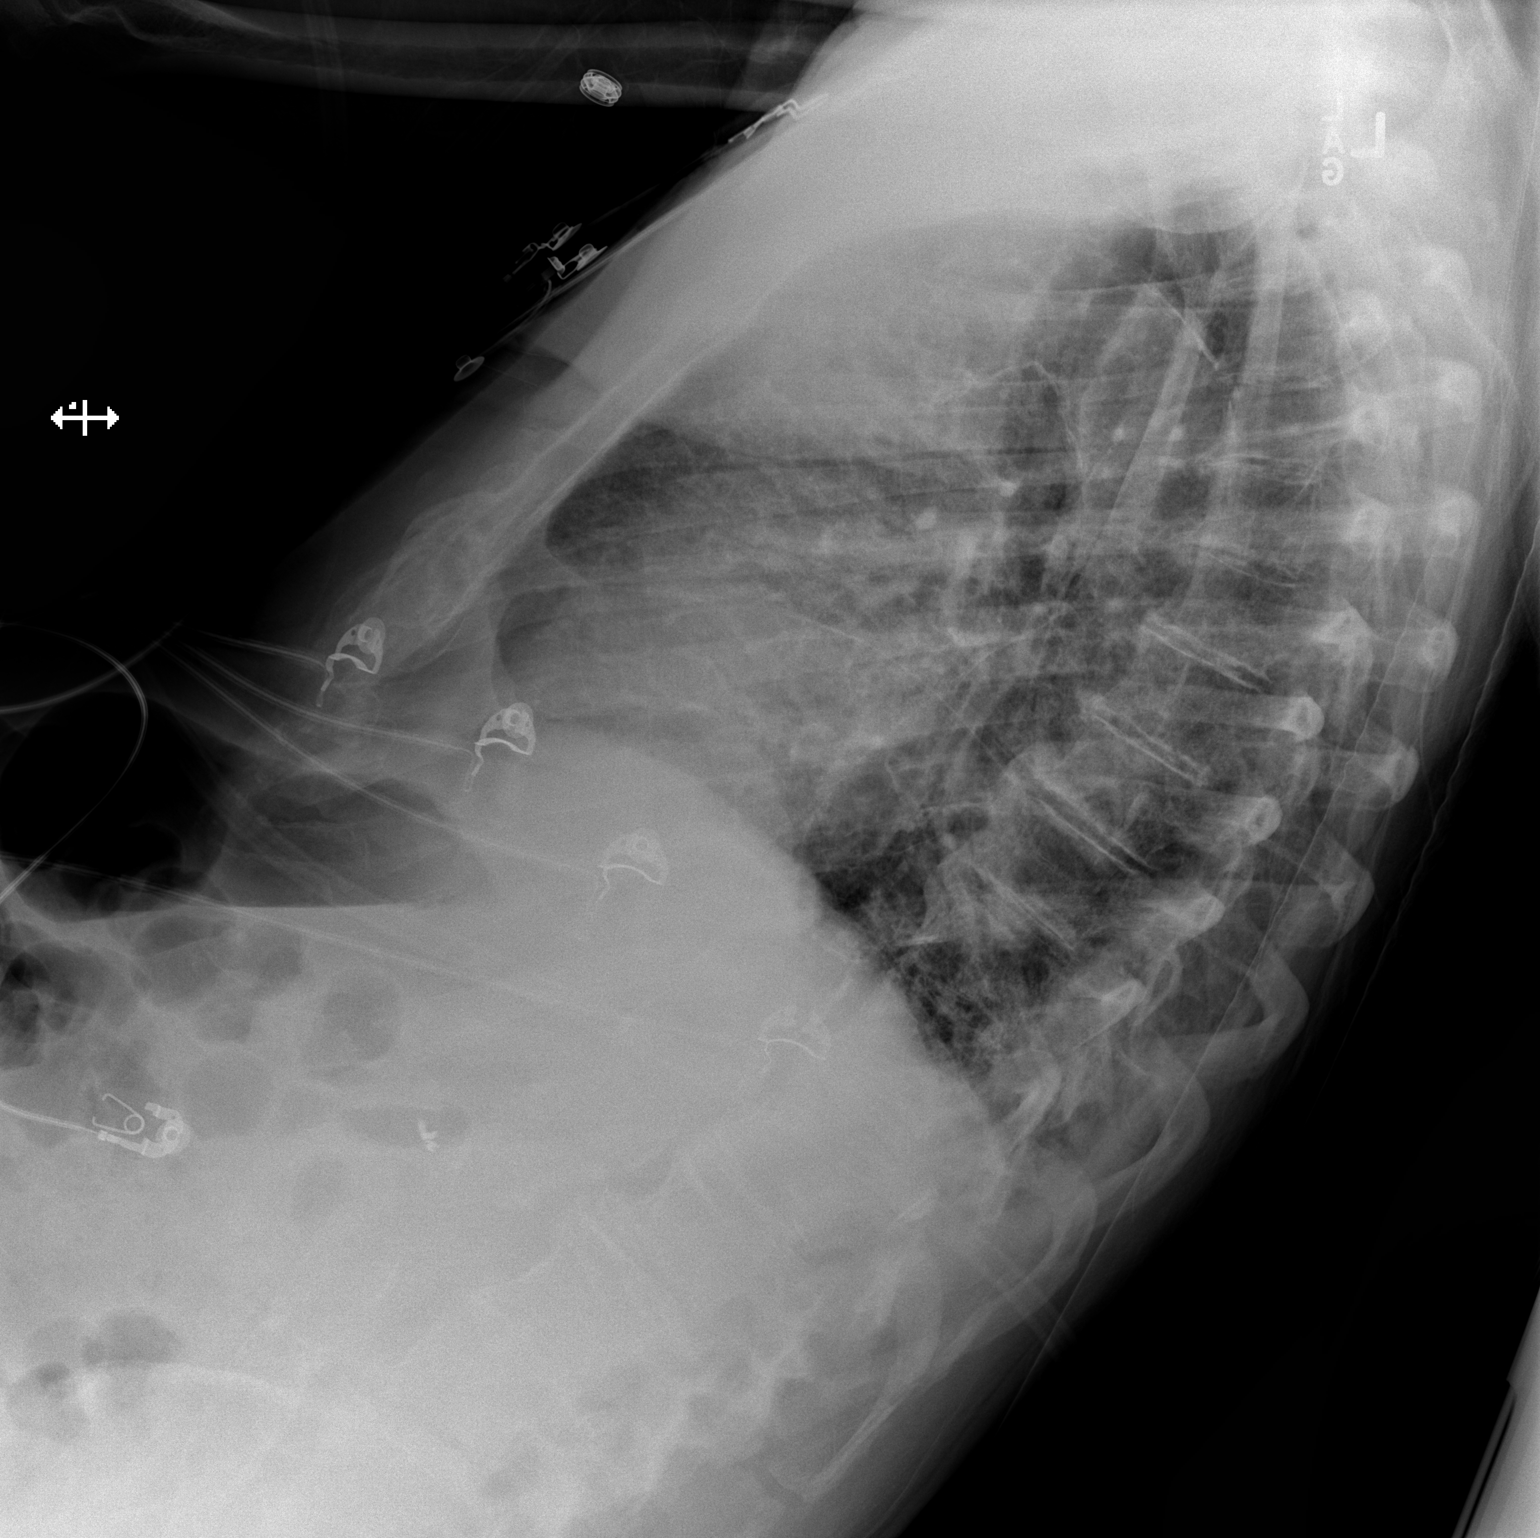

[x chest ap]
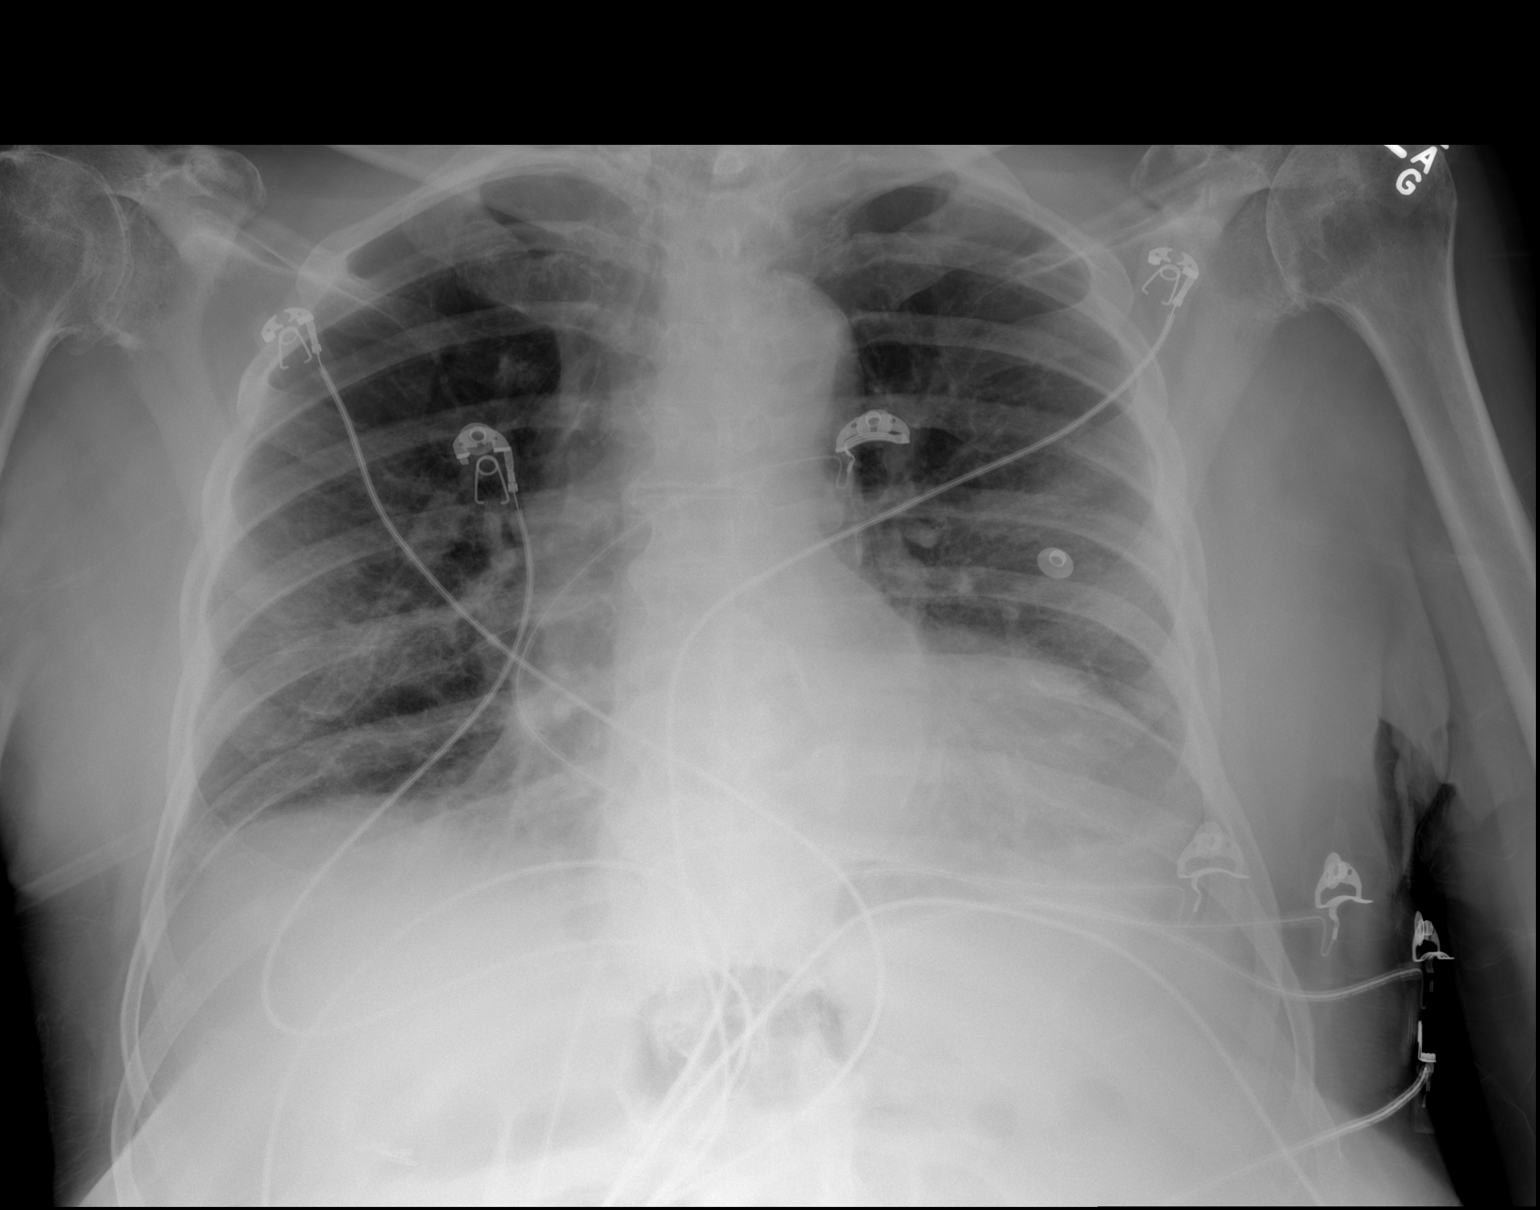

[2 of 2 positions shown; findings below may reference images not displayed]

FINDINGS: Cardiomegaly which is chronic. There is aortic tortuosity. Lungs are
better aerated than previously. There is increased markings at the
bases which is likely from hypoaeration. No effusion or
pneumothorax. No acute osseous findings.
IMPRESSION: Mildly low lung volumes.  No edema or consolidation.
